# Patient Record
Sex: Male | Born: 1945 | Race: White | Hispanic: No | Marital: Married
Health system: Midwestern US, Community
[De-identification: ages and names within clinical notes are randomized; demographics above are authoritative.]

## PROBLEM LIST (undated history)

## (undated) DIAGNOSIS — N401 Enlarged prostate with lower urinary tract symptoms: Secondary | ICD-10-CM

## (undated) DIAGNOSIS — R351 Nocturia: Secondary | ICD-10-CM

## (undated) DIAGNOSIS — N410 Acute prostatitis: Secondary | ICD-10-CM

## (undated) DIAGNOSIS — R300 Dysuria: Secondary | ICD-10-CM

## (undated) DIAGNOSIS — N138 Other obstructive and reflux uropathy: Secondary | ICD-10-CM

## (undated) DIAGNOSIS — I10 Essential (primary) hypertension: Secondary | ICD-10-CM

## (undated) DIAGNOSIS — F0390 Unspecified dementia without behavioral disturbance: Secondary | ICD-10-CM

## (undated) DIAGNOSIS — N390 Urinary tract infection, site not specified: Secondary | ICD-10-CM

## (undated) DIAGNOSIS — M1990 Unspecified osteoarthritis, unspecified site: Secondary | ICD-10-CM

## (undated) DIAGNOSIS — F4310 Post-traumatic stress disorder, unspecified: Secondary | ICD-10-CM

## (undated) HISTORY — PX: TRANSURETHRAL RESECTION OF PROSTATE: SHX73

---

## 1993-08-01 DIAGNOSIS — E039 Hypothyroidism, unspecified: Secondary | ICD-10-CM | POA: Insufficient documentation

## 2012-12-11 DIAGNOSIS — M4310 Spondylolisthesis, site unspecified: Secondary | ICD-10-CM | POA: Insufficient documentation

## 2012-12-11 DIAGNOSIS — M48062 Spinal stenosis, lumbar region with neurogenic claudication: Secondary | ICD-10-CM | POA: Insufficient documentation

## 2012-12-27 DIAGNOSIS — N138 Other obstructive and reflux uropathy: Secondary | ICD-10-CM | POA: Diagnosis present

## 2012-12-27 DIAGNOSIS — F419 Anxiety disorder, unspecified: Secondary | ICD-10-CM | POA: Insufficient documentation

## 2012-12-27 DIAGNOSIS — N401 Enlarged prostate with lower urinary tract symptoms: Secondary | ICD-10-CM | POA: Diagnosis present

## 2012-12-27 DIAGNOSIS — I10 Essential (primary) hypertension: Secondary | ICD-10-CM | POA: Insufficient documentation

## 2012-12-27 DIAGNOSIS — F17200 Nicotine dependence, unspecified, uncomplicated: Secondary | ICD-10-CM | POA: Insufficient documentation

## 2012-12-27 DIAGNOSIS — F32A Depression, unspecified: Secondary | ICD-10-CM | POA: Insufficient documentation

## 2012-12-27 DIAGNOSIS — I4510 Unspecified right bundle-branch block: Secondary | ICD-10-CM | POA: Insufficient documentation

## 2012-12-27 DIAGNOSIS — R000 Tachycardia, unspecified: Secondary | ICD-10-CM | POA: Insufficient documentation

## 2013-01-24 DIAGNOSIS — M6281 Muscle weakness (generalized): Secondary | ICD-10-CM

## 2013-01-31 DIAGNOSIS — F4310 Post-traumatic stress disorder, unspecified: Secondary | ICD-10-CM | POA: Insufficient documentation

## 2014-04-29 DIAGNOSIS — M4802 Spinal stenosis, cervical region: Secondary | ICD-10-CM | POA: Insufficient documentation

## 2014-04-29 DIAGNOSIS — M5124 Other intervertebral disc displacement, thoracic region: Secondary | ICD-10-CM | POA: Insufficient documentation

## 2014-05-06 DIAGNOSIS — M5416 Radiculopathy, lumbar region: Secondary | ICD-10-CM | POA: Insufficient documentation

## 2014-05-27 DIAGNOSIS — N411 Chronic prostatitis: Secondary | ICD-10-CM | POA: Insufficient documentation

## 2014-06-20 DIAGNOSIS — G8918 Other acute postprocedural pain: Secondary | ICD-10-CM | POA: Insufficient documentation

## 2014-06-20 DIAGNOSIS — I959 Hypotension, unspecified: Secondary | ICD-10-CM | POA: Insufficient documentation

## 2014-06-20 DIAGNOSIS — D689 Coagulation defect, unspecified: Secondary | ICD-10-CM | POA: Insufficient documentation

## 2014-06-20 DIAGNOSIS — D62 Acute posthemorrhagic anemia: Secondary | ICD-10-CM | POA: Insufficient documentation

## 2016-02-15 ENCOUNTER — Ambulatory Visit (HOSPITAL_COMMUNITY): Payer: Non-veteran care | Attending: Nurse Practitioner | Admitting: Physical Therapy

## 2016-02-15 DIAGNOSIS — M5442 Lumbago with sciatica, left side: Secondary | ICD-10-CM | POA: Diagnosis present

## 2016-02-15 DIAGNOSIS — M533 Sacrococcygeal disorders, not elsewhere classified: Secondary | ICD-10-CM | POA: Insufficient documentation

## 2016-02-15 DIAGNOSIS — M5441 Lumbago with sciatica, right side: Secondary | ICD-10-CM | POA: Diagnosis present

## 2016-02-15 DIAGNOSIS — R252 Cramp and spasm: Secondary | ICD-10-CM | POA: Insufficient documentation

## 2016-02-15 DIAGNOSIS — R262 Difficulty in walking, not elsewhere classified: Secondary | ICD-10-CM | POA: Diagnosis present

## 2016-02-15 NOTE — Patient Instructions (Signed)
Isometric Abdominal    Lying on back with knees bent, tighten stomach by pressing elbows down. Hold __5__ seconds. Repeat __10__ times per set. Do _1___ sets per session. Do _5___ sessions per day.  http://orth.exer.us/1086   Copyright  VHI. All rights reserved.  Isometric Gluteals    Tighten buttock muscles. Repeat _10___ times per set. Do _1___ sets per session. Do _5___ sessions per day.  http://orth.exer.us/1126   Copyright  VHI. All rights reserved.  Bridging    Slowly raise buttocks from floor, keeping stomach tight. Repeat ___10_ times per set. Do __2 times a day  http://orth.exer.us/1096   Copyright  VHI. All rights reserved.  Scapular Retraction (Standing)   May do sitting  With arms at sides, pinch shoulder blades together. Repeat _10___ times per set. Do _1___ sets per session. Do 3____ sessions per day.  http://orth.exer.us/944   Copyright  VHI. All rights reserved.  Try and pull your testicles up towards your stomach.  You should feel your penis retract like a  Turtle.  Hold 5 seconds relax for 10 seconds and try again .. Do 10 times twice a day

## 2016-02-16 NOTE — Therapy (Signed)
[facility] [address] [place], [place], [address] Phone: [phone]   Fax:  [phone]  Physical Therapy Evaluation  Patient Details  Name: Patrick Kerr MRN: [id number] Date of Birth: 1946/07/19 Referring Provider: Ples SpecterPeggy Cassanda  Encounter Date: 02/15/2016      PT End of Session - 02/15/16 1517    Visit Number 1   Number of Visits 12   Date for PT Re-Evaluation 03/17/16   Authorization Type VA   Authorization - Visit Number 1   Authorization - Number of Visits 10   PT Start Time 1515   PT Stop Time 1600   PT Time Calculation (min) 45 min   Activity Tolerance Patient tolerated treatment well   Behavior [facility] Therapy [facility] for tasks assessed/performed      No past medical history on file.  No past surgical history on file.  There were no vitals filed for this visit.       Subjective Assessment - 02/15/16 1522    Subjective Patrick Kerr states that he has had two back surgeries and a prostate surgery.  He states that if he is riding on his stationary bike he begins to have pain going down both of his outer legs after about 10 minutes.  He also states that he has difficulty moving both his urine and his bowels.     Pertinent History Back surgeries x 2; (one lumbar and one cervical)  prostrate surgery x2   How long can you sit comfortably? no problem    How long can you stand comfortably? able to stand for five minutes    How long can you walk comfortably? able to walk for about five minutes    Patient Stated Goals less pain, easier to have bowel movements and uninate.    Currently in Pain? Yes   Pain Score 4    Pain Location Penis   Pain Descriptors / Indicators Burning   Pain Type Chronic pain   Pain Onset More than a month ago   Pain Frequency Constant   Aggravating Factors  acidic food    Pain Relieving Factors unsure    Effect of Pain on Daily Activities not sure             [facility] -  02/16/16 0001    Assessment   Medical Diagnosis pelvic floor pain    Onset Date/Surgical Date 08/15/14  increasing in sx since 01/14/2016   Next MD Visit unscheduled   Prior Therapy none   Precautions   Precautions None   Restrictions   Weight Bearing Restrictions No   Home Environment   Living Environment Private residence   Prior Function   Level of Independence Independent   Cognition   Overall Cognitive Status Within Functional Limits for tasks assessed   Posture/Postural Control   Posture/Postural Control Postural limitations   Postural Limitations Rounded Shoulders;Forward head;Decreased lumbar lordosis;Flexed trunk   AROM   Lumbar Flexion --  all motiion with hip and knee    Lumbar Extension 0   Lumbar - Right Side Bend 10   Lumbar - Left Side Bend 10   Strength   Overall Strength Within functional limits for tasks performed   Overall Strength Comments pelvic floor unable to complete a kegal    Right Hip Extension 2+/5   Left Hip Extension 2+/5    lower abdominal 2+/5        02/15/16 0001  Posture/Postural Control  Posture/Postural Control Postural  limitations  Postural Limitations Rounded Shoulders;Forward head;Decreased lumbar lordosis;Flexed trunk  Exercises  Exercises Lumbar  Lumbar Exercises: Standing  Other Standing Lumbar Exercises 3-D hip excursion   Lumbar Exercises: Supine  Ab Set 10 reps  Bridge 10 reps  Other Supine Lumbar Exercises kegal x 10                       PT Education - 02/15/16 1515    Education provided Yes   Education Details HEP   Person(s) Educated Patient   Methods Explanation;Demonstration;Handout   Comprehension Verbalized understanding;Returned demonstration          PT Short Term Goals - 02/15/16 1519    PT SHORT TERM GOAL #1   Title Pt to verbalize the importance of posture in back care as well as the benefit of easing voiding    Time 2   Period Weeks   Status New   PT SHORT TERM GOAL #2    Title Pt to have no more radiating pain into either of his legs to allow pt to be able to walk for 30 mintues in comfort.    Time 3   Period Weeks   Status New   PT SHORT TERM GOAL #3   Title Pt to state that voiding is 30% easier    Time 3   Period Weeks   Status New   PT SHORT TERM GOAL #4   Title Pt to state that defecating is 30% easier    Time 3   Period Weeks           PT Long Term Goals - 02/16/16 1610    PT LONG TERM GOAL #1   Title Pt to be independent in an advance home exercise program to allow pt to decrease low back pain to no greater than a 2/10 to allow pt to walk for an hour and a half to allow pt to walk in comfort   Time 6   Period Weeks   Status New   PT LONG TERM GOAL #2   Title Pt to state that he is able to void with 50% more ease   Time 6   Period Weeks   Status New   PT LONG TERM GOAL #3   Title Pt to state that he is able to defecate with 50% more ease    Time 6   Period Weeks   Status New               Plan - 02/15/16 1517    Clinical Impression Statement Patrick Kerr is a 70 yo male who has complaints of low back pain radiating bilaterally down lateral aspect of both thighs to the knee level as well as testicular and penile pain.  The patient also states that he is having difficulty urinating and defecating.  He has been referred to skilled physcial therapy to attempt to resolve these issues.  Examination demonstrates poor posture, weakened abdominal, gluteal and back musculature.  Patrick Kerr will benefit from skilled physical therapy for therapuetic exercises and manual techniques to decrease his symptoms.    Rehab Potential Good   PT Frequency 2x / week   PT Duration 6 weeks   PT Treatment/Interventions ADLs/Self Care Home Management;Therapeutic activities;Therapeutic exercise;Manual techniques;Neuromuscular re-education;Electrical Stimulation;Cryotherapy;Moist Heat   PT Next Visit Plan Pt to be seen for core strengthening.  Begin  prone exercises and postural t-band next treatment.    PT Home Exercise Plan given  Consulted and Agree with Plan of Care Patient      Patient will benefit from skilled therapeutic intervention in order to improve the following deficits and impairments:  Decreased range of motion, Decreased strength, Pain, Increased fascial restricitons, Postural dysfunction  Visit Diagnosis: Bilateral low back pain with sciatica, sciatica laterality unspecified  Difficulty in walking, not elsewhere classified  Cramp and spasm  Sacrococcygeal disorders, not elsewhere classified      G-Codes - 02/23/16 1515    Functional Assessment Tool Used clinical judgement; pt subjective complaints   Functional Limitation Other PT primary   Other PT Primary Current Status (J4782) At least 40 percent but less than 60 percent impaired, limited or restricted   Other PT Primary Goal Status (N5621) At least 20 percent but less than 40 percent impaired, limited or restricted       Problem List There are no active problems to display for this patient.   Virgina Organ, PT CLT [phone] 02/16/2016, 8:44 AM  [facility] [address] [place], [place], [address] Phone: [phone]   Fax:  [phone]  Name: Patrick Kerr MRN: [id number] Date of Birth: June 22, 1946

## 2016-02-19 ENCOUNTER — Ambulatory Visit (HOSPITAL_COMMUNITY): Payer: Non-veteran care | Admitting: Physical Therapy

## 2016-02-19 DIAGNOSIS — R252 Cramp and spasm: Secondary | ICD-10-CM

## 2016-02-19 DIAGNOSIS — M5441 Lumbago with sciatica, right side: Secondary | ICD-10-CM | POA: Diagnosis not present

## 2016-02-19 DIAGNOSIS — R262 Difficulty in walking, not elsewhere classified: Secondary | ICD-10-CM

## 2016-02-19 DIAGNOSIS — M533 Sacrococcygeal disorders, not elsewhere classified: Secondary | ICD-10-CM

## 2016-02-19 DIAGNOSIS — M5442 Lumbago with sciatica, left side: Principal | ICD-10-CM

## 2016-02-19 NOTE — Patient Instructions (Signed)
Bent Leg Lift (Hook-Lying)    Tighten stomach and slowly raise right leg _2___ inches from floor. Keep trunk rigid. Hold __3__ seconds. Repeat __10__ times per set. Do 1____ sets per session. Do ___2_ sessions per day.  http://orth.exer.us/1090   Copyright  VHI. All rights reserved.  Heel Walk (Hook-Lying)    Tighten stomach and slowly slide your foot forward  until leg are nearly straight, or until back begins to arch. Return and repeat with opposite leg Repeat _10___ times per set. Do _1___ sets per session. Do __2__ sessions per day.  http://orth.exer.us/1094   Copyright  VHI. All rights reserved.

## 2016-02-19 NOTE — Therapy (Signed)
[facility] [address] [place], [place], [address] Phone: [phone]   Fax:  [phone]  Physical Therapy Treatment  Patient Details  Name: Patrick CritchleyGary D Manolis MRN: [id number] Date of Birth: Jul 13, 1946 Referring Provider: Ples SpecterPeggy Cassanda  Encounter Date: 02/19/2016      PT End of Session - 02/19/16 0925    Visit Number 2   Number of Visits 12   Date for PT Re-Evaluation 03/17/16   Authorization Type VA   Authorization - Visit Number 2   Authorization - Number of Visits 10   PT Start Time 0905   PT Stop Time 0945   PT Time Calculation (min) 40 min   Activity Tolerance Patient tolerated treatment well   Behavior During Therapy [facility] for tasks assessed/performed      No past medical history on file.  No past surgical history on file.  There were no vitals filed for this visit.      Subjective Assessment - 02/19/16 0909    Subjective Pt states that he has been doing his exercises pretty good    Pertinent History Back surgeries x 2; (one lumbar and one cervical)  prostrate surgery x2   How long can you sit comfortably? no problem    How long can you stand comfortably? able to stand for five minutes    How long can you walk comfortably? able to walk for about five minutes    Patient Stated Goals less pain, easier to have bowel movements and uninate.    Currently in Pain? No/denies   Pain Onset More than a month ago                         [facility] Treatment/Exercise - 02/19/16 0001    Exercises   Exercises Lumbar   Lumbar Exercises: Standing   Other Standing Lumbar Exercises 3-D hip excursion    Other Standing Lumbar Exercises standing at door with Bilateral UE flexion keeping abdominal tight    Lumbar Exercises: Supine   Heel Slides 10 reps   Bent Knee Raise 10 reps   Bridge 10 reps   Bridge Limitations with adduction squeeze    Other Supine Lumbar Exercises kegal x 10    Manual Therapy   Manual  Therapy Soft tissue mobilization   Manual therapy comments done seperate from all other aspects of treatment   Soft tissue mobilization colonic massage                   PT Short Term Goals - 02/19/16 0948    PT SHORT TERM GOAL #1   Title Pt to verbalize the importance of posture in back care as well as the benefit of easing voiding    Time 2   Period Weeks   Status On-going   PT SHORT TERM GOAL #2   Title Pt to have no more radiating pain into either of his legs to allow pt to be able to walk for 30 mintues in comfort.    Time 3   Period Weeks   Status On-going   PT SHORT TERM GOAL #3   Title Pt to state that voiding is 30% easier    Time 3   Period Weeks   Status On-going   PT SHORT TERM GOAL #4   Title Pt to state that defecating is 30% easier    Time 3   Period Weeks   Status On-going  PT Long Term Goals - 02/19/16 0948    PT LONG TERM GOAL #1   Title Pt to be independent in an advance home exercise program to allow pt to decrease low back pain to no greater than a 2/10 to allow pt to walk for an hour and a half to allow pt to walk in comfort   Time 6   Period Weeks   Status On-going   PT LONG TERM GOAL #2   Title Pt to state that he is able to void with 50% more ease   Time 6   Period Weeks   Status On-going   PT LONG TERM GOAL #3   Title Pt to state that he is able to defecate with 50% more ease    Time 6   Period Weeks   Status On-going               Plan - 02/19/16 4098    Clinical Impression Statement Therapist added new exercises with  verbal cuing for proper technique.  Evaluation and goals reviewed with pt.    Rehab Potential Good   PT Frequency 2x / week   PT Duration 6 weeks   PT Treatment/Interventions ADLs/Self Care Home Management;Therapeutic activities;Therapeutic exercise;Manual techniques;Neuromuscular re-education;Electrical Stimulation;Cryotherapy;Moist Heat   PT Next Visit Plan Pt to be seen for core  strengthening.  Begin prone exercises and postural t-band next treatment.    PT Home Exercise Plan given   Consulted and Agree with Plan of Care Patient      Patient will benefit from skilled therapeutic intervention in order to improve the following deficits and impairments:  Decreased range of motion, Decreased strength, Pain, Increased fascial restricitons, Postural dysfunction  Visit Diagnosis: Bilateral low back pain with sciatica, sciatica laterality unspecified  Difficulty in walking, not elsewhere classified  Cramp and spasm  Sacrococcygeal disorders, not elsewhere classified     Problem List There are no active problems to display for this patient.   Virgina Organ, PT CLT [phone] 02/19/2016, 9:49 AM  [facility] [address] [place], [place], [address] Phone: [phone]   Fax:  [phone]  Name: BRYDAN DOWNARD MRN: [id number] Date of Birth: 1946/04/08

## 2016-02-26 ENCOUNTER — Ambulatory Visit (HOSPITAL_COMMUNITY): Payer: Non-veteran care | Admitting: Physical Therapy

## 2016-02-26 DIAGNOSIS — M5441 Lumbago with sciatica, right side: Secondary | ICD-10-CM

## 2016-02-26 DIAGNOSIS — M533 Sacrococcygeal disorders, not elsewhere classified: Secondary | ICD-10-CM

## 2016-02-26 DIAGNOSIS — M5442 Lumbago with sciatica, left side: Principal | ICD-10-CM

## 2016-02-26 DIAGNOSIS — R252 Cramp and spasm: Secondary | ICD-10-CM

## 2016-02-26 DIAGNOSIS — R262 Difficulty in walking, not elsewhere classified: Secondary | ICD-10-CM

## 2016-02-26 NOTE — Patient Instructions (Addendum)
Scapular Retraction (Prone)    Lie with arms at sides. Pinch shoulder blades together and raise arms a few inches from floor. Repeat __10__ times per set. Do1 ____ sets per session. Do __2__ sessions per day.  http://orth.exer.us/954   Copyright  VHI. All rights reserved.  Scapular: Retraction (Prone)    Holding __0__ pound weights, keep arms out from sides and elbows bent. Pull elbows back, pinching shoulder blades together. Repeat _10___ times per set. Do __1__ sets per session. Do ___2_ sessions per day. 10 http://orth.exer.us/862   Copyright  VHI. All rights reserved.  Strengthening: Hip Extension (Prone)    Tighten muscles on front of left thigh, then lift leg __2__ inches from surface, keeping knee locked. Repeat _10___ times per set. Do __1__ sets per session. Do ____ sessions per day. 2 http://orth.exer.us/620   Copyright  VHI. All rights reserved.

## 2016-02-26 NOTE — Therapy (Signed)
[facility] [address] [place], [place], [address] Phone: [phone]   Fax:  [phone]  Physical Therapy Treatment  Patient Details  Name: DEVEN FURIA MRN: [id number] Date of Birth: 05-Dec-1945 Referring Provider: Ples Specter  Encounter Date: 02/26/2016      PT End of Session - 02/26/16 1105    Visit Number 3   Number of Visits 12   Date for PT Re-Evaluation 03/17/16   Authorization Type VA   Authorization - Visit Number 3   Authorization - Number of Visits 10   PT Start Time 1035   PT Stop Time 1118   PT Time Calculation (min) 43 min   Activity Tolerance Patient tolerated treatment well   Behavior During Therapy [facility] for tasks assessed/performed      No past medical history on file.  No past surgical history on file.  There were no vitals filed for this visit.      Subjective Assessment - 02/26/16 1051    Subjective Mr. McCowell states that he has been doing his exercises.  He states that it is not as difficult to have a BM now.    Pain Score 4    Pain Location Penis   Pain Type Chronic pain   Pain Onset More than a month ago   Pain Frequency Constant   Aggravating Factors  activity    Pain Relieving Factors lying down    Effect of Pain on Daily Activities increases pain    Multiple Pain Sites No                         [facility] Adult PT Treatment/Exercise - 02/26/16 0001      Exercises   Exercises Lumbar     Lumbar Exercises: Standing   Scapular Retraction 10 reps   Theraband Level (Scapular Retraction) Level 3 (Green)   Row 10 reps   Theraband Level (Row) Level 3 (Green)   Shoulder Extension Strengthening;10 reps   Other Standing Lumbar Exercises 3-D hip excursion    Other Standing Lumbar Exercises standing at door with Bilateral UE flexion keeping abdominal tight      Lumbar Exercises: Supine   Heel Slides 10 reps   Bent Knee Raise 10 reps   Bridge 10 reps   Bridge Limitations  with adduction squeeze    Other Supine Lumbar Exercises kegal x 10      Lumbar Exercises: Prone   Straight Leg Raise 10 reps   Other Prone Lumbar Exercises Rows; shoulder extension x 10      Manual Therapy   Manual Therapy Soft tissue mobilization   Manual therapy comments done seperate from all other aspects of treatment   Soft tissue mobilization colonic massage                 PT Education - 02/26/16 1104    Education provided Yes   Education Details new HEP   Person(s) Educated Patient   Methods Explanation;Handout;Tactile cues   Comprehension Verbalized understanding;Returned demonstration          PT Short Term Goals - 02/26/16 1118      PT SHORT TERM GOAL #1   Title Pt to verbalize the importance of posture in back care as well as the benefit of easing voiding    Time 2   Period Weeks   Status Achieved     PT SHORT TERM GOAL #2   Title Pt to have  no more radiating pain into either of his legs to allow pt to be able to walk for 30 mintues in comfort.    Time 3   Period Weeks   Status On-going     PT SHORT TERM GOAL #3   Title Pt to state that voiding is 30% easier    Time 3   Period Weeks   Status On-going     PT SHORT TERM GOAL #4   Title Pt to state that defecating is 30% easier    Time 3   Period Weeks   Status On-going           PT Long Term Goals - 02/26/16 1119      PT LONG TERM GOAL #1   Title Pt to be independent in an advance home exercise program to allow pt to decrease low back pain to no greater than a 2/10 to allow pt to walk for an hour and a half to allow pt to walk in comfort   Time 6   Period Weeks   Status On-going     PT LONG TERM GOAL #2   Title Pt to state that he is able to void with 50% more ease   Time 6   Period Weeks   Status On-going     PT LONG TERM GOAL #3   Title Pt to state that he is able to defecate with 50% more ease    Time 6   Period Weeks   Status On-going               Plan -  02/26/16 1105    Clinical Impression Statement Mr. Mizera has noticed slight improvement in his symptoms.  Therapist began new postural and core strengthening exercise with manual and verbal cuing needed for proper technique.   Pt given handout of core strengthening; will give t-band and posture exercise next treatment if pt demonstrates good technique.    Rehab Potential Good   PT Frequency 2x / week   PT Duration 6 weeks   PT Treatment/Interventions ADLs/Self Care Home Management;Therapeutic activities;Therapeutic exercise;Manual techniques;Neuromuscular re-education;Electrical Stimulation;Cryotherapy;Moist Heat   PT Next Visit Plan Pt to be seen for core strengthening.  Begin t-band golf swing and reverse golf swing next treatment.    PT Home Exercise Plan given   Consulted and Agree with Plan of Care Patient      Patient will benefit from skilled therapeutic intervention in order to improve the following deficits and impairments:  Decreased range of motion, Decreased strength, Pain, Increased fascial restricitons, Postural dysfunction  Visit Diagnosis: Bilateral low back pain with sciatica, sciatica laterality unspecified  Difficulty in walking, not elsewhere classified  Cramp and spasm  Sacrococcygeal disorders, not elsewhere classified     Problem List There are no active problems to display for this patient. Virgina Organ, PT CLT [phone] 02/26/2016, 11:21 AM  [facility] [address] [place], [place], [address] Phone: [phone]   Fax:  [phone]  Name: KEYONNE FORSHA MRN: [id number] Date of Birth: 1945/12/10

## 2016-02-29 ENCOUNTER — Ambulatory Visit (HOSPITAL_COMMUNITY): Payer: Non-veteran care | Admitting: Physical Therapy

## 2016-02-29 DIAGNOSIS — M5441 Lumbago with sciatica, right side: Secondary | ICD-10-CM

## 2016-02-29 DIAGNOSIS — M533 Sacrococcygeal disorders, not elsewhere classified: Secondary | ICD-10-CM

## 2016-02-29 DIAGNOSIS — M5442 Lumbago with sciatica, left side: Principal | ICD-10-CM

## 2016-02-29 DIAGNOSIS — R252 Cramp and spasm: Secondary | ICD-10-CM

## 2016-02-29 DIAGNOSIS — R262 Difficulty in walking, not elsewhere classified: Secondary | ICD-10-CM

## 2016-02-29 NOTE — Therapy (Signed)
[facility] [address] [place], [place], [address] Phone: [phone]   Fax:  [phone]  Physical Therapy Treatment  Patient Details  Name: Patrick Kerr MRN: [id number] Date of Birth: 1946-03-23 Referring Provider: Ples Specter  Encounter Date: 02/29/2016      PT End of Session - 02/29/16 1039    Visit Number 4   Number of Visits 12   Date for PT Re-Evaluation 03/17/16   Authorization Type [facility]   Authorization - Visit Number 4   Authorization - Number of Visits 10   PT Start Time 1020   PT Stop Time 1106   PT Time Calculation (min) 46 min   Activity Tolerance Patient tolerated treatment well   Behavior During Therapy [facility] for tasks assessed/performed      No past medical history on file.  No past surgical history on file.  There were no vitals filed for this visit.      Subjective Assessment - 02/29/16 1107    Subjective Pt states that he has the same amount of pain in the shaft of his penis and goes into his testicles.  States that he has been to five urologist all who have cleared him.     Pertinent History Back surgeries x 2; (one lumbar and one cervical)  prostrate surgery x2   How long can you sit comfortably? no problem    How long can you stand comfortably? able to stand for five minutes    How long can you walk comfortably? able to walk for about five minutes    Patient Stated Goals less pain, easier to have bowel movements and uninate.    Pain Score 3    Pain Location Penis   Pain Descriptors / Indicators Aching   Pain Radiating Towards testicles   Pain Onset More than a month ago   Aggravating Factors  unknown   Pain Relieving Factors unknown   Effect of Pain on Daily Activities unknown                         [facility] Adult PT Treatment/Exercise - 02/29/16 0001      Exercises   Exercises Lumbar     Lumbar Exercises: Standing   Scapular Retraction 10 reps   Theraband Level (Scapular  Retraction) Level 3 (Green)   Row 10 reps   Theraband Level (Row) Level 3 (Green)   Shoulder Extension Strengthening;10 reps   Other Standing Lumbar Exercises golfing motion with green t-band    Other Standing Lumbar Exercises standing at door with Bilateral UE flexion keeping abdominal tight      Lumbar Exercises: Supine   Heel Slides 10 reps   Toe tap  10 reps   Bridge 15 reps   Bridge Limitations with adduction squeeze    Other Supine Lumbar Exercises kegal x 15     Lumbar Exercises: Prone   Straight Leg Raise --   Other Prone Lumbar Exercises --     Manual Therapy   Manual Therapy Soft tissue mobilization   Manual therapy comments done seperate from all other aspects of treatment   Soft tissue mobilization colonic massage                 PT Education - 02/29/16 1039    Education provided Yes   Education Details t-band exercises   Person(s) Educated Patient   Methods Explanation;Demonstration;Verbal cues;Handout   Comprehension Verbalized understanding;Returned demonstration  PT Short Term Goals - 02/29/16 1109      PT SHORT TERM GOAL #1   Title Pt to verbalize the importance of posture in back care as well as the benefit of easing voiding    Time 2   Period Weeks   Status Achieved     PT SHORT TERM GOAL #2   Title Pt to have no more radiating pain into either of his legs to allow pt to be able to walk for 30 mintues in comfort.    Time 3   Period Weeks   Status On-going     PT SHORT TERM GOAL #3   Title Pt to state that voiding is 30% easier    Time 3   Period Weeks   Status On-going     PT SHORT TERM GOAL #4   Title Pt to state that defecating is 30% easier    Time 3   Period Weeks   Status On-going           PT Long Term Goals - 02/29/16 1109      PT LONG TERM GOAL #1   Title Pt to be independent in an advance home exercise program to allow pt to decrease low back pain to no greater than a 2/10 to allow pt to walk for an hour  and a half to allow pt to walk in comfort   Time 6   Period Weeks   Status On-going     PT LONG TERM GOAL #2   Title Pt to state that he is able to void with 50% more ease   Time 6   Period Weeks   Status On-going     PT LONG TERM GOAL #3   Title Pt to state that he is able to defecate with 50% more ease    Time 6   Period Weeks   Status On-going               Plan - 02/29/16 1040    Clinical Impression Statement PT has decreased proprioception and is not able to tell when he is completing a [place], although therapist can feel when pt is contracting.     Rehab Potential Good   PT Frequency 2x / week   PT Duration 6 weeks   PT Treatment/Interventions ADLs/Self Care Home Management;Therapeutic activities;Therapeutic exercise;Manual techniques;Neuromuscular re-education;Electrical Stimulation;Cryotherapy;Moist Heat   PT Next Visit Plan Pt to be seen for core strengthening.  Begin t-band over the door next treatment down to opposite hip.    PT Home Exercise Plan given   Consulted and Agree with Plan of Care Patient      Patient will benefit from skilled therapeutic intervention in order to improve the following deficits and impairments:  Decreased range of motion, Decreased strength, Pain, Increased fascial restricitons, Postural dysfunction  Visit Diagnosis: Bilateral low back pain with sciatica, sciatica laterality unspecified  Difficulty in walking, not elsewhere classified  Cramp and spasm  Sacrococcygeal disorders, not elsewhere classified     Problem List There are no active problems to display for this patient.  Virgina Organ, PT CLT [phone] 02/29/2016, 11:10 AM  [facility] [address] [place], [place], [address] Phone: [phone]   Fax:  [phone]  Name: Patrick Kerr MRN: [id number] Date of Birth: 09-May-1946

## 2016-03-03 ENCOUNTER — Ambulatory Visit (HOSPITAL_COMMUNITY): Payer: Non-veteran care | Attending: Nurse Practitioner | Admitting: Physical Therapy

## 2016-03-03 DIAGNOSIS — R252 Cramp and spasm: Secondary | ICD-10-CM | POA: Diagnosis present

## 2016-03-03 DIAGNOSIS — M533 Sacrococcygeal disorders, not elsewhere classified: Secondary | ICD-10-CM | POA: Insufficient documentation

## 2016-03-03 DIAGNOSIS — R262 Difficulty in walking, not elsewhere classified: Secondary | ICD-10-CM | POA: Diagnosis present

## 2016-03-03 DIAGNOSIS — M5442 Lumbago with sciatica, left side: Secondary | ICD-10-CM | POA: Diagnosis present

## 2016-03-03 DIAGNOSIS — M5441 Lumbago with sciatica, right side: Secondary | ICD-10-CM

## 2016-03-03 NOTE — Therapy (Signed)
[facility] [address] [place], [place], [address] Phone: [phone]   Fax:  [phone]  Physical Therapy Treatment  Patient Details  Name: Patrick Kerr MRN: [id number] Date of Birth: [birth year] Referring Provider: Ples Specter  Encounter Date: 03/03/2016      PT End of Session - 03/03/16 1143    Visit Number 5   Number of Visits 12   Date for PT Re-Evaluation 03/17/16   Authorization Type VA   Authorization - Visit Number 5   Authorization - Number of Visits 10   PT Start Time 1116   PT Stop Time 1200   PT Time Calculation (min) 44 min   Activity Tolerance Patient tolerated treatment well   Behavior During Therapy [facility] for tasks assessed/performed      No past medical history on file.  No past surgical history on file.  There were no vitals filed for this visit.      Subjective Assessment - 03/03/16 1118    Subjective Pt states that he has been doing his exercises    Pertinent History Back surgeries x 2; (one lumbar and one cervical)  prostrate surgery x2   How long can you sit comfortably? no problem    How long can you stand comfortably? able to stand for five minutes    How long can you walk comfortably? able to walk for about five minutes    Patient Stated Goals less pain, easier to have bowel movements and uninate.    Pain Score 2    Pain Location Penis   Pain Descriptors / Indicators Stabbing   Pain Type Chronic pain   Pain Onset More than a month ago   Pain Frequency Intermittent   Aggravating Factors  unknown   Pain Relieving Factors unknown   Effect of Pain on Daily Activities unknown                          [facility] Adult PT Treatment/Exercise - 03/03/16 0001      Exercises   Exercises Lumbar     Lumbar Exercises: Stretches   Hip Flexor Stretch 1 rep;60 seconds     Lumbar Exercises: Standing   Scapular Retraction 10 reps   Theraband Level (Scapular Retraction) Level 3 (Green)    Row 10 reps   Theraband Level (Row) Level 3 (Green)   Shoulder Extension Strengthening;10 reps   Shoulder Extension Limitations T-band over doorway Rt arm to Lt hip; Lt arm to rt hip, then B UE to B hip x 10 each   Other Standing Lumbar Exercises golfing motion with green t-band    Other Standing Lumbar Exercises standing at door with Bilateral UE flexion keeping abdominal tight      Lumbar Exercises: Supine   Heel Slides 15 reps   Bent Knee Raise --   Bent Knee Raise Limitations RT leg at 90 degrees lower 30 degrees x 10 repeat with left    Bridge 15 reps   Bridge Limitations with adduction squeeze    Other Supine Lumbar Exercises kegal x 15   Other Supine Lumbar Exercises toe tap x 10; ab crunch x 1o      Lumbar Exercises: Prone   Straight Leg Raise 10 reps   Other Prone Lumbar Exercises Rows; shoulder extension x 10      Manual Therapy   Manual Therapy --   Manual therapy comments --   Soft tissue mobilization --  PT Short Term Goals - 03/03/16 1146      PT SHORT TERM GOAL #1   Title Pt to verbalize the importance of posture in back care as well as the benefit of easing voiding    Time 2   Period Weeks   Status Achieved     PT SHORT TERM GOAL #2   Title Pt to have no more radiating pain into either of his legs to allow pt to be able to walk for 30 mintues in comfort.    Time 3   Period Weeks   Status On-going     PT SHORT TERM GOAL #3   Title Pt to state that voiding is 30% easier    Time 3   Period Weeks   Status On-going     PT SHORT TERM GOAL #4   Title Pt to state that defecating is 30% easier    Time 3   Period Weeks   Status On-going           PT Long Term Goals - 03/03/16 1146      PT LONG TERM GOAL #1   Title Pt to be independent in an advance home exercise program to allow pt to decrease low back pain to no greater than a 2/10 to allow pt to walk for an hour and a half to allow pt to walk in comfort   Time 6    Period Weeks   Status On-going     PT LONG TERM GOAL #2   Title Pt to state that he is able to void with 50% more ease   Time 6   Period Weeks   Status On-going     PT LONG TERM GOAL #3   Title Pt to state that he is able to defecate with 50% more ease    Time 6   Period Weeks   Status On-going               Plan - 03/03/16 1144    Clinical Impression Statement Pt pain level has decreased; bowel movements are easier but he continues to have difficulty getting his urine to flow.    Rehab Potential Good   PT Frequency 2x / week   PT Duration 6 weeks   PT Treatment/Interventions ADLs/Self Care Home Management;Therapeutic activities;Therapeutic exercise;Manual techniques;Neuromuscular re-education;Electrical Stimulation;Cryotherapy;Moist Heat   PT Next Visit Plan Pt to be seen for core strengthening; stop manual as pt is doing this independently at home now.     PT Home Exercise Plan given   Consulted and Agree with Plan of Care Patient      Patient will benefit from skilled therapeutic intervention in order to improve the following deficits and impairments:  Decreased range of motion, Decreased strength, Pain, Increased fascial restricitons, Postural dysfunction  Visit Diagnosis: Bilateral low back pain with sciatica, sciatica laterality unspecified  Difficulty in walking, not elsewhere classified  Cramp and spasm  Sacrococcygeal disorders, not elsewhere classified     Problem List There are no active problems to display for this patient.   Virgina Organ, PT CLT [phone] 03/03/2016, 12:00 PM  [facility] [address] [place], [place], [address] Phone: [phone]   Fax:  [phone]  Name: Patrick Kerr MRN: [id number] Date of Birth: 1946/01/27

## 2016-03-07 ENCOUNTER — Ambulatory Visit (HOSPITAL_COMMUNITY): Payer: Non-veteran care

## 2016-03-07 DIAGNOSIS — M533 Sacrococcygeal disorders, not elsewhere classified: Secondary | ICD-10-CM

## 2016-03-07 DIAGNOSIS — R262 Difficulty in walking, not elsewhere classified: Secondary | ICD-10-CM

## 2016-03-07 DIAGNOSIS — M5442 Lumbago with sciatica, left side: Principal | ICD-10-CM

## 2016-03-07 DIAGNOSIS — M5441 Lumbago with sciatica, right side: Secondary | ICD-10-CM | POA: Diagnosis not present

## 2016-03-07 DIAGNOSIS — R252 Cramp and spasm: Secondary | ICD-10-CM

## 2016-03-07 NOTE — Therapy (Signed)
[facility] [address] [place], [place], [address] Phone: [phone]   Fax:  [phone]  Physical Therapy Treatment  Patient Details  Name: Patrick CritchleyGary D Kerr MRN: [id number] Date of Birth: [birth year] Referring Provider: Ples SpecterPeggy Cassanda  Encounter Date: 03/07/2016      PT End of Session - 03/07/16 1046    Visit Number 6   Number of Visits 12   Date for PT Re-Evaluation 03/17/16   Authorization Type VA   Authorization - Visit Number 6   Authorization - Number of Visits 10   PT Start Time 1035   PT Stop Time 1113   PT Time Calculation (min) 38 min   Activity Tolerance Patient tolerated treatment well;No increased pain   Behavior [facility] Therapy [facility] for tasks assessed/performed      No past medical history on file.  No past surgical history on file.  There were no vitals filed for this visit.      Subjective Assessment - 03/07/16 1039    Subjective Pt doing fine today. Unremarkable weekend. Pt reports HEP is going well. His pain and sx remain unchanged.    Pertinent History Back surgeries x 2; (one lumbar and one cervical)  prostrate surgery x2                         [facility] Adult PT Treatment/Exercise - 03/07/16 0001      Lumbar Exercises: Stretches   Hip Flexor Stretch 60 seconds;2 reps  bilat; contrlateral hip flexed.      Lumbar Exercises: Standing   Scapular Retraction 10 reps   Theraband Level (Scapular Retraction) Level 3 (Green)   Row 10 reps   Theraband Level (Row) Level 3 (Green)   Shoulder Extension Strengthening;10 reps   Shoulder Extension Limitations T-band over doorway Rt arm to Lt hip; Lt arm to rt hip, then B UE to B hip x 10 each   Other Standing Lumbar Exercises golfing motion with green t-band    Other Standing Lumbar Exercises T-band over doorway Rt arm to Lt hip; Lt arm to rt hip, then B UE to B hip x 10 each     Lumbar Exercises: Supine   Heel Slides 15 reps   Bent Knee Raise 10  reps  90/90 double knee raise c ball squeeze    Bent Knee Raise Limitations RT leg at 90 degrees lower 30 degrees x 10 repeat with left    Bridge 15 reps  volleyball   Bridge Limitations with adduction squeeze    Straight Leg Raise 15 reps   Other Supine Lumbar Exercises --  *defered this session.      Lumbar Exercises: Prone   Straight Leg Raise 10 reps  very limited range, may work better in quadruped next visit   Other Prone Lumbar Exercises Rows; shoulder extension x 15   supine on t-spien towel roll                  PT Short Term Goals - 03/03/16 1146      PT SHORT TERM GOAL #1   Title Pt to verbalize the importance of posture in back care as well as the benefit of easing voiding    Time 2   Period Weeks   Status Achieved     PT SHORT TERM GOAL #2   Title Pt to have no more radiating pain into either of his legs to allow pt to be able  to walk for 30 mintues in comfort.    Time 3   Period Weeks   Status On-going     PT SHORT TERM GOAL #3   Title Pt to state that voiding is 30% easier    Time 3   Period Weeks   Status On-going     PT SHORT TERM GOAL #4   Title Pt to state that defecating is 30% easier    Time 3   Period Weeks   Status On-going           PT Long Term Goals - 03/03/16 1146      PT LONG TERM GOAL #1   Title Pt to be independent in an advance home exercise program to allow pt to decrease low back pain to no greater than a 2/10 to allow pt to walk for an hour and a half to allow pt to walk in comfort   Time 6   Period Weeks   Status On-going     PT LONG TERM GOAL #2   Title Pt to state that he is able to void with 50% more ease   Time 6   Period Weeks   Status On-going     PT LONG TERM GOAL #3   Title Pt to state that he is able to defecate with 50% more ease    Time 6   Period Weeks   Status On-going               Plan - 03/07/16 1050    Clinical Impression Statement Session focus largely on posture today,  continuing with activities from previous session. Some activites are modified to a supine on thoracici towel roll posture to engage greater mobilization of T spine. Pt able to show some progress toward goals as evident in ability to advance exercises today in either reps or resistance.    Rehab Potential Good   PT Frequency 2x / week   PT Duration 6 weeks   PT Treatment/Interventions ADLs/Self Care Home Management;Therapeutic activities;Therapeutic exercise;Manual techniques;Neuromuscular re-education;Electrical Stimulation;Cryotherapy;Moist Heat   PT Next Visit Plan Pt to be seen for core strengthening.    PT Home Exercise Plan given   Consulted and Agree with Plan of Care Patient      Patient will benefit from skilled therapeutic intervention in order to improve the following deficits and impairments:  Decreased range of motion, Decreased strength, Pain, Increased fascial restricitons, Postural dysfunction  Visit Diagnosis: Bilateral low back pain with sciatica, sciatica laterality unspecified  Difficulty in walking, not elsewhere classified  Cramp and spasm  Sacrococcygeal disorders, not elsewhere classified     Problem List There are no active problems to display for this patient.  11:13 AM, 03/07/16 Rosamaria Lints, PT, DPT Physical Therapist at [facility] [phone] (office)     [facility] [address] [place], [place], [address] Phone: [phone]   Fax:  [phone]  Name: Patrick Kerr MRN: [id number] Date of Birth: [birth year]

## 2016-03-10 ENCOUNTER — Ambulatory Visit (HOSPITAL_COMMUNITY): Payer: Non-veteran care | Admitting: Physical Therapy

## 2016-03-10 DIAGNOSIS — R252 Cramp and spasm: Secondary | ICD-10-CM

## 2016-03-10 DIAGNOSIS — M533 Sacrococcygeal disorders, not elsewhere classified: Secondary | ICD-10-CM

## 2016-03-10 DIAGNOSIS — M5441 Lumbago with sciatica, right side: Secondary | ICD-10-CM

## 2016-03-10 DIAGNOSIS — M5442 Lumbago with sciatica, left side: Principal | ICD-10-CM

## 2016-03-10 DIAGNOSIS — R262 Difficulty in walking, not elsewhere classified: Secondary | ICD-10-CM

## 2016-03-10 NOTE — Therapy (Signed)
Patrick Kerr, [place], [address] Phone: [phone]   Fax:  [phone]  Physical Therapy Treatment  Patient Details  Name: Patrick Kerr MRN: [id number] Date of Birth: 12-27-1945 Referring Provider: Brantley Fling  Encounter Date: 03/10/2016      PT End of Session - 03/10/16 1108    Visit Number 7   Number of Visits 12   Date for PT Re-Evaluation 03/17/16   Authorization Type VA   Authorization - Visit Number 7   Authorization - Number of Visits 10   PT Start Time 0174   PT Stop Time 1115   PT Time Calculation (min) 40 min   Activity Tolerance Patient tolerated treatment well;No increased pain   Behavior During Therapy [facility] for tasks assessed/performed      No past medical history on file.  No past surgical history on file.  There were no vitals filed for this visit.      Subjective Assessment - 03/10/16 1057    Subjective Pt  states that he can see a slight improvement .    Pertinent History Back surgeries x 2; (one lumbar and one cervical)  prostrate surgery x2   How long can you sit comfortably? no problem    How long can you stand comfortably? able to stand for five minutes    How long can you walk comfortably? able to walk for about five minutes    Patient Stated Goals less pain, easier to have bowel movements and uninate.    Pain Score 2    Pain Location Penis   Pain Descriptors / Indicators Burning   Pain Type Chronic pain   Pain Onset More than a month ago   Pain Frequency Intermittent   Aggravating Factors  nothing noted   Pain Relieving Factors nothing noted   Effect of Pain on Daily Activities nothing noted                         [facility] Adult PT Treatment/Exercise - 03/10/16 0001      Exercises   Exercises Lumbar     Lumbar Exercises: Stretches   Active Hamstring Stretch 3 reps;30 seconds   Hip Flexor Stretch 60 seconds;2 reps  bilat; contrlateral hip flexed.       Lumbar Exercises: Standing   Scapular Retraction --   Theraband Level (Scapular Retraction) --   Row --   Theraband Level (Row) --   Shoulder Extension --   Other Standing Lumbar Exercises B UE flexion keeping back to the door x 10; golfing motion with green t-band    Other Standing Lumbar Exercises T-band over doorway Rt arm to Lt hip; Lt arm to rt hip, then B UE to B hip x 10 each     Lumbar Exercises: Supine   Heel Slides --   Bent Knee Raise 10 reps  90/90 double knee raise c ball squeeze    Bent Knee Raise Limitations RT leg at 90 degrees lower 30 degrees x 10 repeat with left    Bridge    Bridge Limitations with adduction squeeze    Straight Leg Raise 15 reps   Other Supine Lumbar Exercises kegal x 15   Other Supine Lumbar Exercises toe tap x 10; ab crunch x 1o      Lumbar Exercises: Prone   Straight Leg Raise --  very limited range, may work better in quadruped next visit   Other Prone  Lumbar Exercises --  supine on t-spien towel roll     Lumbar Exercises: Quadruped   Straight Leg Raise 10 reps   Opposite Arm/Leg Raise Right arm/Left leg;Left arm/Right leg;10 reps                  PT Short Term Goals - 03/10/16 1110      PT SHORT TERM GOAL #1   Title Pt to verbalize the importance of posture in back care as well as the benefit of easing voiding    Time 2   Period Weeks   Status Achieved     PT SHORT TERM GOAL #2   Title Pt to have no more radiating pain into either of his legs to allow pt to be able to walk for 30 mintues in comfort.    Time 3   Period Weeks   Status Partially Met     PT SHORT TERM GOAL #3   Title Pt to state that voiding is 30% easier    Time 3   Period Weeks   Status On-going     PT SHORT TERM GOAL #4   Title Pt to state that defecating is 30% easier    Time 3   Period Weeks   Status On-going           PT Long Term Goals - 03/10/16 1110      PT LONG TERM GOAL #1   Title Pt to be independent in an advance home  exercise program to allow pt to decrease low back pain to no greater than a 2/10 to allow pt to walk for an hour and a half to allow pt to walk in comfort   Time 6   Period Weeks   Status Partially Met     PT LONG TERM GOAL #2   Title Pt to state that he is able to void with 50% more ease   Time 6   Period Weeks   Status On-going     PT LONG TERM GOAL #3   Title Pt to state that he is able to defecate with 50% more ease    Time 6   Period Weeks   Status On-going               Plan - 03/10/16 1108    Clinical Impression Statement Added quadriped exercises for improved core strength as well as hamstring stretches for improved posture.  Pt continues to need verbal cuing for proper technique with all exercises.     Rehab Potential Good   PT Frequency 2x / week   PT Duration 6 weeks   PT Treatment/Interventions ADLs/Self Care Home Management;Therapeutic activities;Therapeutic exercise;Manual techniques;Neuromuscular re-education;Electrical Stimulation;Cryotherapy;Moist Heat   PT Next Visit Plan Pt to be seen for core strengthening and postural education.   PT Home Exercise Plan given   Consulted and Agree with Plan of Care Patient      Patient will benefit from skilled therapeutic intervention in order to improve the following deficits and impairments:  Decreased range of motion, Decreased strength, Pain, Increased fascial restricitons, Postural dysfunction  Visit Diagnosis: Bilateral low back pain with sciatica, sciatica laterality unspecified  Difficulty in walking, not elsewhere classified  Cramp and spasm  Sacrococcygeal disorders, not elsewhere classified     Problem List There are no active problems to display for this patient.   Patrick Kerr, PT CLT [phone] 03/10/2016, 11:24 AM  [facility] [address]  [place], [place], [address] Phone: [phone]   Fax:  [phone]  Name: Patrick Kerr MRN:  [id number] Date of Birth: Aug 20, 1945

## 2016-03-14 ENCOUNTER — Ambulatory Visit (HOSPITAL_COMMUNITY): Payer: Non-veteran care | Admitting: Physical Therapy

## 2016-03-14 DIAGNOSIS — M533 Sacrococcygeal disorders, not elsewhere classified: Secondary | ICD-10-CM

## 2016-03-14 DIAGNOSIS — R262 Difficulty in walking, not elsewhere classified: Secondary | ICD-10-CM

## 2016-03-14 DIAGNOSIS — M5441 Lumbago with sciatica, right side: Secondary | ICD-10-CM | POA: Diagnosis not present

## 2016-03-14 DIAGNOSIS — M5442 Lumbago with sciatica, left side: Principal | ICD-10-CM

## 2016-03-14 DIAGNOSIS — R252 Cramp and spasm: Secondary | ICD-10-CM

## 2016-03-14 NOTE — Therapy (Addendum)
[place], [place], [address] Phone: [phone]   Fax:  [phone]  Physical Therapy Treatment  Patient Details  Name: Patrick Kerr MRN: [id number] Date of Birth: 1945/09/03 Referring Provider: Brantley Fling  Encounter Date: 03/14/2016      PT End of Session - 03/14/16 1108    Visit Number 8   Number of Visits 12   Date for PT Re-Evaluation 03/17/16   Authorization Type [facility]   Authorization - Visit Number 8   Authorization - Number of Visits 10   PT Start Time 1655   PT Stop Time 1115   PT Time Calculation (min) 41 min   Activity Tolerance Patient tolerated treatment well;No increased pain   Behavior During Therapy [facility] for tasks assessed/performed      No past medical history on file.  No past surgical history on file.  There were no vitals filed for this visit.      Subjective Assessment - 03/14/16 1033    Subjective Pt is doing his exercises at home.   Pertinent History Back surgeries x 2; (one lumbar and one cervical)  prostrate surgery x2   How long can you sit comfortably? no problem    How long can you stand comfortably? able to stand for five minutes    How long can you walk comfortably? able to walk for about five minutes    Patient Stated Goals less pain, easier to have bowel movements and uninate.    Currently in Pain? No/denies   Pain Onset More than a month ago                         [facility] Adult PT Treatment/Exercise - 03/14/16 0001      Exercises   Exercises Lumbar     Lumbar Exercises: Stretches   Active Hamstring Stretch 3 reps;30 seconds   Hip Flexor Stretch 60 seconds;2 reps  bilat; contrlateral hip flexed.      Lumbar Exercises: Standing   Other Standing Lumbar Exercises B UE flexion keeping back to the door x 10; golfing motion with green t-band    Other Standing Lumbar Exercises T-band below pull back B(elbows even with shoulders )T-band over  doorway Rt arm to Lt hip; Lt arm to rt hip, then B UE to B hip x 10 each     Lumbar Exercises: Supine   Bent Knee Raise 10 reps  90/90 double knee raise c ball squeeze    Bent Knee Raise Limitations RT leg at 90 degrees lower 30 degrees x 10 repeat with left    Bridge 15 reps  volleyball   Bridge Limitations with adduction squeeze    Straight Leg Raise 15 reps   Large Ball Abdominal Isometric 10 reps   Large Ball Oblique Isometric 10 reps   Other Supine Lumbar Exercises kegal x 15   Other Supine Lumbar Exercises toe tap x 10; ab crunch x 1o      Lumbar Exercises: Sidelying   Hip Abduction 15 reps     Lumbar Exercises: Prone   Straight Leg Raise --  very limited range, may work better in quadruped next visit   Other Prone Lumbar Exercises --  supine on t-spien towel roll     Lumbar Exercises: Quadruped   Straight Leg Raise 10 reps   Opposite Arm/Leg Raise Right arm/Left leg;Left arm/Right leg;5 reps  PT Short Term Goals - 03/14/16 1110      PT SHORT TERM GOAL #1   Title Pt to verbalize the importance of posture in back care as well as the benefit of easing voiding    Time 2   Period Weeks   Status Achieved     PT SHORT TERM GOAL #2   Title Pt to have no more radiating pain into either of his legs to allow pt to be able to walk for 30 mintues in comfort.    Time 3   Period Weeks   Status Partially Met     PT SHORT TERM GOAL #3   Title Pt to state that voiding is 30% easier    Time 3   Period Weeks   Status On-going     PT SHORT TERM GOAL #4   Title Pt to state that defecating is 30% easier    Time 3   Period Weeks   Status On-going           PT Long Term Goals - 03/14/16 1110      PT LONG TERM GOAL #1   Title Pt to be independent in an advance home exercise program to allow pt to decrease low back pain to no greater than a 2/10 to allow pt to walk for an hour and a half to allow pt to walk in comfort   Time 6   Period Weeks    Status Partially Met     PT LONG TERM GOAL #2   Title Pt to state that he is able to void with 50% more ease   Time 6   Period Weeks   Status On-going     PT LONG TERM GOAL #3   Title Pt to state that he is able to defecate with 50% more ease    Time 6   Period Weeks   Status On-going               Plan - 03/14/16 1109    Clinical Impression Statement Pt states that when he does prone SLR it causes his neck to hurt therefore this exercise was avoided.  Added ball isometrics to improve lower abdominal strength.    Rehab Potential Good   PT Frequency 2x / week   PT Duration 6 weeks   PT Treatment/Interventions ADLs/Self Care Home Management;Therapeutic activities;Therapeutic exercise;Manual techniques;Neuromuscular re-education;Electrical Stimulation;Cryotherapy;Moist Heat   PT Next Visit Plan Pt to be seen for core strengthening and postural education.   PT Home Exercise Plan given   Consulted and Agree with Plan of Care Patient      Patient will benefit from skilled therapeutic intervention in order to improve the following deficits and impairments:  Decreased range of motion, Decreased strength, Pain, Increased fascial restricitons, Postural dysfunction  Visit Diagnosis: Bilateral low back pain with sciatica, sciatica laterality unspecified  Difficulty in walking, not elsewhere classified  Cramp and spasm  Sacrococcygeal disorders, not elsewhere classified     Problem List There are no active problems to display for this patient.   [name], PT CLT [id number] 03/14/2016, 11:19 AM  [place], [place], [address] Phone: [phone]   Fax:  [phone]  Name: Patrick Kerr MRN: [id number] Date of Birth: [birth year]  PHYSICAL THERAPY DISCHARGE SUMMARY  Visits from Start of Care: 8  Current functional level related to goals / functional outcomes: Pt still having a difficult  time getting his  urine to flow.  States bowel movements are easier.  Pain is still present in penis and testicles but  Not as severe as when he started.   Remaining deficits: Pt began having neck pain from prone exercises    Education / Equipment: HEP Plan: Patient agrees to discharge.  Patient goals were partially met. Patient is being discharged due to the patient's request.  ?????          [name], [place] CLT [phone]

## 2016-03-16 ENCOUNTER — Telehealth (HOSPITAL_COMMUNITY): Payer: Self-pay

## 2016-03-16 NOTE — Telephone Encounter (Signed)
Pt wanted to cx all future appts.  He said that a vertebrae was giving him trouble.

## 2016-03-17 ENCOUNTER — Encounter (HOSPITAL_COMMUNITY): Payer: Medicare Other

## 2016-03-21 ENCOUNTER — Encounter (HOSPITAL_COMMUNITY): Payer: Medicare Other | Admitting: Physical Therapy

## 2016-03-22 ENCOUNTER — Encounter (HOSPITAL_COMMUNITY): Payer: Medicare Other

## 2016-03-24 ENCOUNTER — Encounter (HOSPITAL_COMMUNITY): Payer: Medicare Other | Admitting: Physical Therapy

## 2016-03-28 ENCOUNTER — Encounter (HOSPITAL_COMMUNITY): Payer: Medicare Other | Admitting: Physical Therapy

## 2016-03-31 ENCOUNTER — Encounter (HOSPITAL_COMMUNITY): Payer: Medicare Other | Admitting: Physical Therapy

## 2016-04-05 ENCOUNTER — Encounter (HOSPITAL_COMMUNITY): Payer: Medicare Other | Admitting: Physical Therapy

## 2016-06-21 ENCOUNTER — Encounter: Attending: Urology

## 2016-06-22 ENCOUNTER — Ambulatory Visit: Admit: 2016-06-22 | Discharge: 2016-06-22 | Payer: MEDICARE | Attending: Urology

## 2016-06-22 DIAGNOSIS — R300 Dysuria: Secondary | ICD-10-CM

## 2016-06-22 LAB — AMB POC URINALYSIS DIP STICK AUTO W/ MICRO (MICRO RESULTS)
Bilirubin (UA POC): NEGATIVE
Blood (UA POC): NEGATIVE
Crystals (UA POC): NEGATIVE
Epithelial cells (UA POC): 0
Glucose (UA POC): NEGATIVE
Ketones (UA POC): NEGATIVE
Leukocyte esterase (UA POC): NEGATIVE
Nitrites (UA POC): POSITIVE
Protein (UA POC): NEGATIVE
RBCs (UA POC): 0
Specific gravity (UA POC): 1.01 (ref 1.001–1.035)
Urobilinogen (UA POC): 0.2 (ref 0.2–1)
WBCs (UA POC): 0
pH (UA POC): 6 (ref 4.6–8.0)

## 2016-06-22 LAB — AMB POC PVR, MEAS,POST-VOID RES,US,NON-IMAGING: PVR: 62 cc

## 2016-06-22 MED ORDER — TAMSULOSIN SR 0.4 MG 24 HR CAP
0.4 mg | ORAL_CAPSULE | Freq: Every evening | ORAL | 12 refills | Status: DC
Start: 2016-06-22 — End: 2017-08-11

## 2016-06-22 MED ORDER — TRIMETHOPRIM-SULFAMETHOXAZOLE 80 MG-400 MG TAB
80-400 mg | ORAL_TABLET | Freq: Two times a day (BID) | ORAL | 0 refills | Status: AC
Start: 2016-06-22 — End: 2016-07-06

## 2016-06-22 NOTE — Progress Notes (Signed)
HISTORY OF PRESENT ILLNESS:  Jonathan CritchleyGary D Durham is a 70 y.o. male who is seen in consultation for c/o UTI. Pt w/ BPH on Flomax. Pt states that he has moderate dysuria and moderate, aching discomfort in scrotum and buttox. Pt reports nocturia x2 and some hesitancy. Pt reports that he had a UTI approx 3 wks ago and was given Cipro. Pt has been taking pyridium for approx 1 yr. Pt has been seeing another urologist and wanted a second opinion.     AUA Symptom Score 06/22/2016   Over the past month how often have you had the sensation that your bladder was not completely empty after you finished urinating? 0   Over the past month, how often have had to urinate again less than 2 hours after you last finished urinating? 1   Over the past month, how often have you found you stopped and started again several times when you urinated? 2   Over the past month, how often have you found it difficult to postpone urination? 1   Over the past month, how often have you had a weak urinary stream? 2   Over the past month, how often have you had to push or strain to begin urinating? 0   Over the past month, how many times did you most typically get up to urinate from the time you went to bed at night until the time you got up in the morning? 2   AUA Score 8   If you were to spend the rest of your life with your urinary condition the way it is now, how would you feel about that? Mostly satisified       Past Medical History:   Diagnosis Date   ??? Kidney stones    ??? Urinary tract disease        Past Surgical History:   Procedure Laterality Date   ??? HX BACK SURGERY     ??? HX LITHOTRIPSY     ??? HX TURP         Social History   Substance Use Topics   ??? Smoking status: Former Smoker     Quit date: 08/01/1997   ??? Smokeless tobacco: Never Used   ??? Alcohol use No       Allergies   Allergen Reactions   ??? Azithromycin Other (comments)     Causes ulcers       Family History   Problem Relation Age of Onset   ??? No Known Problems Mother     ??? No Known Problems Father    ??? No Known Problems Sister    ??? No Known Problems Brother        Current Outpatient Prescriptions   Medication Sig Dispense Refill   ??? levothyroxine (SYNTHROID) 150 mcg tablet      ??? sertraline (ZOLOFT) 100 mg tablet Take  by mouth daily.     ??? hydroCHLOROthiazide (HYDRODIURIL) 25 mg tablet Take 25 mg by mouth daily.     ??? LORazepam (ATIVAN) 1 mg tablet Take  by mouth every four (4) hours as needed for Anxiety.     ??? cyanocobalamin (VITAMIN B12) 1,000 mcg/mL injection 1,000 mcg by IntraMUSCular route once.     ??? acetaminophen (TYLENOL) 325 mg tablet Take  by mouth every four (4) hours as needed for Pain.     ??? Cholecalciferol, Vitamin D3, 3,000 unit tab Take  by mouth.     ??? ibuprofen 200 mg cap Take  by mouth.     ???  tamsulosin (FLOMAX) 0.4 mg capsule Take 2 Caps by mouth nightly. 60 Cap 12   ??? trimethoprim-sulfamethoxazole (BACTRIM, SEPTRA) 80-400 mg per tablet Take 1 Tab by mouth two (2) times a day for 14 days. 28 Tab 0   ??? montelukast (SINGULAIR) 10 mg tablet TAKE 1 TABLET BY MOUTH AT BEDTIME  4   ??? PROLENSA 0.07 % ophthalmic solution INSTILL 1 DROP IN THE RIGHT EYE ONCE A DAY, STARTING THE DAY OF SURGERY, AFTER THE PROCEDURE  1   ??? ketoconazole (NIZORAL) 2 % topical cream APPLY TO AFFECTED AREA ON THE SKIN 2 TIMES DAILY  0   ??? SYNTHROID 50 mcg tablet      ??? trimethoprim-polymyxin b (POLYTRIM) ophthalmic solution INSTILL 1 DROP IN THE RIGHT EYE 4 TIMES A DAY STARTING THE DAY OF SURGERY, AFTER THE PROCEDURE  1   ??? prednisoLONE acetate (PRED FORTE) 1 % ophthalmic suspension INSTILL 1 DROP IN THE RIGHT EYE 4 TIMES A DAY STARTING THE DAY OF SURGERY, AFTER THE PROCEDURE  1   ??? TRAVATAN Z 0.004 % ophthalmic solution INSTILL 1 DROP INTO BOTH EYES BEFORE BEDTIME  12       Review of Systems  Constitutional: Fever: No  Skin: Rash: No  HEENT: Hearing difficulty: No  Eyes: Blurred vision: No  Cardiovascular: Chest pain: No  Respiratory: Shortness of breath: No   Gastrointestinal: Nausea/vomiting: No  Musculoskeletal: Back pain: No  Neurological: Weakness: No  Psychological: Memory loss: No  Comments/additional findings:       PHYSICAL EXAMINATION:   Visit Vitals   ??? BP 119/66   ??? Pulse 86   ??? Temp 98.8 ??F (37.1 ??C)   ??? Resp 18   ??? Ht 6\' 4"  (1.93 m)   ??? Wt 240 lb 3.2 oz (109 kg)   ??? BMI 29.24 kg/m2     Constitutional: WDWN, Pleasant and appropriate affect, No acute distress.    CV:  No peripheral swelling noted  Respiratory: No respiratory distress or difficulties  Abdomen:  No abdominal masses or tenderness. No CVA tenderness. No inguinal hernias noted.   GU Male:    [id number] normal to visual inspection, no erythema or irritation. Sphincter with good tone. Rectum with no hemorrhoids, fissures or masses. Prostate smooth, symmetric and anodular. Prostate is moderately enlarged.  SCROTUM:  No scrotal rash or lesions noticed.  Normal bilateral testes and epididymis.   PENIS: Urethral meatus normal in location and size. No urethral discharge.  Skin: No jaundice.    Neuro/Psych:  Alert and oriented x 3, affect appropriate.   Lymphatic:   No enlarged inguinal lymph nodes.        REVIEW OF LABS AND IMAGING:    Results for orders placed or performed in visit on 06/22/16   AMB POC URINALYSIS DIP STICK AUTO W/ MICRO (MICRO RESULTS)   Result Value Ref Range    Color (UA POC) Yellow     Clarity (UA POC) Clear     Glucose (UA POC) Negative Negative    Bilirubin (UA POC) Negative Negative    Ketones (UA POC) Negative Negative    Specific gravity (UA POC) 1.010 1.001 - 1.035    Blood (UA POC) Negative Negative    pH (UA POC) 6.0 4.6 - 8.0    Protein (UA POC) Negative Negative    Urobilinogen (UA POC) 0.2 mg/dL 0.2 - 1    Nitrites (UA POC) Positive Negative    Leukocyte esterase (UA POC) Negative Negative  Epithelial cells (UA POC) 0     WBCs (UA POC) 0      RBCs (UA POC) 0      Bacteria (UA POC) None Negative    Crystals (UA POC) Negative Negative    Other (UA POC)      AMB POC PVR, MEAS,POST-VOID RES,US,NON-IMAGING   Result Value Ref Range    PVR 62 cc       ASSESSMENT:   1. Dysuria    2. Acute prostatitis    3. Benign non-nodular prostatic hyperplasia with lower urinary tract symptoms    4. Acute cystitis without hematuria         PLAN:    ?? Told to stop pyridium due to risk of renal failure with long-term use  ?? Chem 7 and PSA today  ?? Increase Flomax to 0.8mg   ?? Rx Bactrim BID for 14 days  ?? F/u 2.5 wks  ?? Will request notes from outside urologist  ?? Possible cysto in the future    Chief Complaint   Patient presents with   ??? UTI     ?     Medical documentation provided with the assistance of Isac SarnaKimber N Moore, medical scribe for [employer] LifeChristi Darleene Cumpian, DO, [address] Carson LaneFACOS     Quinnie Barcelo Bailey LakesHughart, [place]

## 2016-07-13 ENCOUNTER — Ambulatory Visit: Admit: 2016-07-13 | Discharge: 2016-07-13 | Payer: MEDICARE | Attending: Urology

## 2016-07-13 DIAGNOSIS — R300 Dysuria: Secondary | ICD-10-CM

## 2016-07-13 LAB — AMB POC URINALYSIS DIP STICK AUTO W/ MICRO (MICRO RESULTS)
Bilirubin (UA POC): NEGATIVE
Blood (UA POC): NEGATIVE
Crystals (UA POC): NEGATIVE
Epithelial cells (UA POC): 0
Glucose (UA POC): NEGATIVE
Ketones (UA POC): NEGATIVE
Leukocyte esterase (UA POC): NEGATIVE
Nitrites (UA POC): NEGATIVE
Protein (UA POC): NEGATIVE
RBCs (UA POC): 0
Specific gravity (UA POC): 1.02 (ref 1.001–1.035)
Urobilinogen (UA POC): 0.2 (ref 0.2–1)
WBCs (UA POC): 0
pH (UA POC): 5.5 (ref 4.6–8.0)

## 2016-07-13 LAB — AMB POC PVR, MEAS,POST-VOID RES,US,NON-IMAGING: PVR: 26 cc

## 2016-07-13 NOTE — Progress Notes (Signed)
HISTORY OF PRESENT ILLNESS:  Jonathan Durham is a 70 y.o. male who presents today for f/u recent ?prostatitis w/ c/o dysuria, discomfort in scrotum and perineum. Flomax increased to 0.8mg . Cr 1.1. Lab did not draw PSA. Pt does have hx of stones according to outside notes.    Pt reports doing better. Still has discomfort in scrotum and perineum and dysuria (reports 50-75% improvement). Denies any new back/flank pain.     AUA Symptom Score 06/22/2016   Over the past month how often have you had the sensation that your bladder was not completely empty after you finished urinating? 0   Over the past month, how often have had to urinate again less than 2 hours after you last finished urinating? 1   Over the past month, how often have you found you stopped and started again several times when you urinated? 2   Over the past month, how often have you found it difficult to postpone urination? 1   Over the past month, how often have you had a weak urinary stream? 2   Over the past month, how often have you had to push or strain to begin urinating? 0   Over the past month, how many times did you most typically get up to urinate from the time you went to bed at night until the time you got up in the morning? 2   AUA Score 8   If you were to spend the rest of your life with your urinary condition the way it is now, how would you feel about that? Mostly satisified       Past Medical History:   Diagnosis Date   ??? Kidney stones    ??? Urinary tract disease        Past Surgical History:   Procedure Laterality Date   ??? HX BACK SURGERY     ??? HX LITHOTRIPSY     ??? HX TURP         Social History   Substance Use Topics   ??? Smoking status: Former Smoker     Quit date: 08/01/1997   ??? Smokeless tobacco: Never Used   ??? Alcohol use No       Allergies   Allergen Reactions   ??? Azithromycin Other (comments)     Causes ulcers       Family History   Problem Relation Age of Onset   ??? No Known Problems Mother    ??? No Known Problems Father     ??? No Known Problems Sister    ??? No Known Problems Brother        Current Outpatient Prescriptions   Medication Sig Dispense Refill   ??? montelukast (SINGULAIR) 10 mg tablet TAKE 1 TABLET BY MOUTH AT BEDTIME  4   ??? PROLENSA 0.07 % ophthalmic solution INSTILL 1 DROP IN THE RIGHT EYE ONCE A DAY, STARTING THE DAY OF SURGERY, AFTER THE PROCEDURE  1   ??? ketoconazole (NIZORAL) 2 % topical cream APPLY TO AFFECTED AREA ON THE SKIN 2 TIMES DAILY  0   ??? SYNTHROID 50 mcg tablet      ??? levothyroxine (SYNTHROID) 150 mcg tablet      ??? trimethoprim-polymyxin b (POLYTRIM) ophthalmic solution INSTILL 1 DROP IN THE RIGHT EYE 4 TIMES A DAY STARTING THE DAY OF SURGERY, AFTER THE PROCEDURE  1   ??? prednisoLONE acetate (PRED FORTE) 1 % ophthalmic suspension INSTILL 1 DROP IN THE RIGHT EYE 4 TIMES A DAY STARTING  THE DAY OF SURGERY, AFTER THE PROCEDURE  1   ??? TRAVATAN Z 0.004 % ophthalmic solution INSTILL 1 DROP INTO BOTH EYES BEFORE BEDTIME  12   ??? sertraline (ZOLOFT) 100 mg tablet Take  by mouth daily.     ??? hydroCHLOROthiazide (HYDRODIURIL) 25 mg tablet Take 25 mg by mouth daily.     ??? LORazepam (ATIVAN) 1 mg tablet Take  by mouth every four (4) hours as needed for Anxiety.     ??? cyanocobalamin (VITAMIN B12) 1,000 mcg/mL injection 1,000 mcg by IntraMUSCular route once.     ??? acetaminophen (TYLENOL) 325 mg tablet Take  by mouth every four (4) hours as needed for Pain.     ??? Cholecalciferol, Vitamin D3, 3,000 unit tab Take  by mouth.     ??? ibuprofen 200 mg cap Take  by mouth.     ??? tamsulosin (FLOMAX) 0.4 mg capsule Take 2 Caps by mouth nightly. 60 Cap 12       REVIEW OF SYSTEMS:  Constitutional: Fever: No  Skin: Rash: No  HEENT: Hearing difficulty: No  Eyes: Blurred vision: No  Cardiovascular: Chest pain: No  Respiratory: Shortness of breath: No  Gastrointestinal: Nausea/vomiting: No  Musculoskeletal: Back pain: No  Neurological: Weakness: No  Psychological: Memory loss: No  Comments/additional findings:       PHYSICAL EXAMINATION:    Visit Vitals   ??? BP 128/72   ??? Pulse 81   ??? Temp 97.6 ??F (36.4 ??C)   ??? Resp 16   ??? Ht 6\' 4"  (1.93 m)   ??? Wt 243 lb (110.2 kg)   ??? BMI 29.58 kg/m2     Constitutional: Well developed, no acute distress.   Eyes:  Conjunctiva normal.  Ears:  External ear normal.  Nose/Throat:  External nose normal.  CV:  Heart rate regular, No peripheral swelling noted.  Respiratory: No respiratory distress, No audible wheeze.  Skin: No rash, No ulcer.    Neuro/Psych:  Patient with appropriate affect.  Alert and oriented x 3.    Gait:  Normal.      REVIEW OF LABS AND IMAGING:      Results for orders placed or performed in visit on 07/13/16   AMB POC URINALYSIS DIP STICK AUTO W/ MICRO (MICRO RESULTS)   Result Value Ref Range    Color (UA POC) Yellow     Clarity (UA POC) Clear     Glucose (UA POC) Negative Negative    Bilirubin (UA POC) Negative Negative    Ketones (UA POC) Negative Negative    Specific gravity (UA POC) 1.020 1.001 - 1.035    Blood (UA POC) Negative Negative    pH (UA POC) 5.5 4.6 - 8.0    Protein (UA POC) Negative Negative    Urobilinogen (UA POC) 0.2 mg/dL 0.2 - 1    Nitrites (UA POC) Negative Negative    Leukocyte esterase (UA POC) Negative Negative    Epithelial cells (UA POC) 0     WBCs (UA POC) 0      RBCs (UA POC) 0      Bacteria (UA POC) None Negative    Crystals (UA POC) Negative Negative    Other (UA POC)     AMB POC PVR, MEAS,POST-VOID RES,US,NON-IMAGING   Result Value Ref Range    PVR 26 cc       ASSESSMENT:     ICD-10-CM ICD-9-CM    1. Dysuria R30.0 788.1    2. Benign non-nodular prostatic  hyperplasia with lower urinary tract symptoms N40.1 600.91 AMB POC URINALYSIS DIP STICK AUTO W/ MICRO (MICRO RESULTS)      AMB POC PVR, MEAS,POST-VOID RES,US,NON-IMAGING   3. Pelvic pain R10.2 IMO0002    4. Personal history of urinary (tract) infections Z87.440 V13.02    5. Personal history of kidney stones Z87.442 V13.01         PLAN:    ?? PSA in 2 wks  ?? Schedule for Urodynamics  ?? Cysto after UDS      Patient's BMI is out of the normal parameters.  Information about BMI was given and patient was advised to follow-up with their PCP for further management.    Chief Complaint   Patient presents with   ??? UTI     Medical documentation provided with the assistance of Isac SarnaKimber N Moore, medical scribe for [employer] LifeChristi Keyleigh Manninen, DO, [place]FAC[place].    Shawnese Magner TuckerHughart, DO

## 2016-07-26 ENCOUNTER — Telehealth

## 2016-07-26 NOTE — Telephone Encounter (Signed)
Left messaging reminding patient that Dr. Cleda MccreedyHughart is requesting his psa to be done this week per last office note. Order is in patient pick-up folder.

## 2016-07-27 LAB — PSA, DIAGNOSTIC (PROSTATE SPECIFIC AG): Prostate Specific Ag: 1.5 ng/ml

## 2016-07-28 ENCOUNTER — Encounter

## 2016-08-17 ENCOUNTER — Encounter

## 2016-08-18 ENCOUNTER — Encounter

## 2016-08-25 ENCOUNTER — Encounter: Attending: Urology

## 2016-09-22 ENCOUNTER — Ambulatory Visit: Admit: 2016-09-22 | Discharge: 2016-09-22 | Payer: MEDICARE

## 2016-09-22 DIAGNOSIS — N401 Enlarged prostate with lower urinary tract symptoms: Secondary | ICD-10-CM

## 2016-09-22 NOTE — Progress Notes (Signed)
Amie CritchleyGary D Palazzo is a 71 y.o. male who is here today for Urodynamic testing per Dr. Cleda MccreedyHughart???s order.   Dr. Cleda MccreedyHughart was available in the clinic as incident to provider. Testing was performed by [facility].    Patient instructed to report any symptoms of fever, chills, urinary frequency, burning, dribbling, hesitation in starting the stream of urine as well as cloudiness or any other unusual color or characteristic of the urine to our office.  Patient verbalized understanding of the above.  Questions were answered      Orders Placed This Encounter   ??? PR ELECTRO-UROFLOWMETRY, FIRST   ??? PR COMPLEX CYSTOMETROGRAM VOIDING PRESSURE STUDIES   ??? PR ANAL/URINARY MUSCLE STUDY   ??? PR VOIDING PRESS STUDY INTRA-ABDOMINAL VOID     Reviewed by [facility] Louis[facility]i Valeriano Bain, DO.

## 2016-09-29 ENCOUNTER — Encounter: Attending: Urology

## 2016-10-06 ENCOUNTER — Encounter: Attending: Urology

## 2016-10-06 ENCOUNTER — Ambulatory Visit: Admit: 2016-10-06 | Discharge: 2016-10-06 | Payer: MEDICARE | Attending: Urology

## 2016-10-06 DIAGNOSIS — R102 Pelvic and perineal pain: Secondary | ICD-10-CM

## 2016-10-06 LAB — AMB POC URINALYSIS DIP STICK AUTO W/ MICRO (MICRO RESULTS)
Bilirubin (UA POC): NEGATIVE
Blood (UA POC): NEGATIVE
Crystals (UA POC): NEGATIVE
Epithelial cells (UA POC): 0
Glucose (UA POC): NEGATIVE
Ketones (UA POC): NEGATIVE
Leukocyte esterase (UA POC): NEGATIVE
Nitrites (UA POC): NEGATIVE
Protein (UA POC): NEGATIVE
RBCs (UA POC): 0
Specific gravity (UA POC): 1.015 (ref 1.001–1.035)
Urobilinogen (UA POC): 0.2 (ref 0.2–1)
WBCs (UA POC): 0
pH (UA POC): 6 (ref 4.6–8.0)

## 2016-10-06 MED ORDER — FINASTERIDE 5 MG TAB
5 mg | ORAL_TABLET | Freq: Every day | ORAL | 12 refills | Status: AC
Start: 2016-10-06 — End: ?

## 2016-10-06 NOTE — Progress Notes (Signed)
HISTORY OF PRESENT ILLNESS:  Jonathan Durham is a 71 y.o. male who presents today for H&P for UroLift procedure. Cysto showed regrowth from prior TURP. Prostate volume 28.27g.       AUA Symptom Score 10/06/2016   Over the past month how often have you had the sensation that your bladder was not completely empty after you finished urinating? 1   Over the past month, how often have had to urinate again less than 2 hours after you last finished urinating? 1   Over the past month, how often have you found you stopped and started again several times when you urinated? 1   Over the past month, how often have you found it difficult to postpone urination? 1   Over the past month, how often have you had a weak urinary stream? 1   Over the past month, how often have you had to push or strain to begin urinating? 1   Over the past month, how many times did you most typically get up to urinate from the time you went to bed at night until the time you got up in the morning? 2   AUA Score 8   If you were to spend the rest of your life with your urinary condition the way it is now, how would you feel about that? Mostly dissatisfied       Past Medical History:   Diagnosis Date   ??? Kidney stones    ??? Urinary tract disease        Past Surgical History:   Procedure Laterality Date   ??? HX BACK SURGERY     ??? HX LITHOTRIPSY     ??? HX TURP         Social History   Substance Use Topics   ??? Smoking status: Former Smoker     Quit date: 08/01/1997   ??? Smokeless tobacco: Never Used   ??? Alcohol use No       Allergies   Allergen Reactions   ??? Azithromycin Other (comments)     Causes ulcers       Family History   Problem Relation Age of Onset   ??? No Known Problems Mother    ??? No Known Problems Father    ??? No Known Problems Sister    ??? No Known Problems Brother        Current Outpatient Prescriptions   Medication Sig Dispense Refill   ??? finasteride (PROSCAR) 5 mg tablet Take 1 Tab by mouth daily. 30 Tab 12    ??? montelukast (SINGULAIR) 10 mg tablet TAKE 1 TABLET BY MOUTH AT BEDTIME  4   ??? PROLENSA 0.07 % ophthalmic solution INSTILL 1 DROP IN THE RIGHT EYE ONCE A DAY, STARTING THE DAY OF SURGERY, AFTER THE PROCEDURE  1   ??? ketoconazole (NIZORAL) 2 % topical cream APPLY TO AFFECTED AREA ON THE SKIN 2 TIMES DAILY  0   ??? SYNTHROID 50 mcg tablet      ??? levothyroxine (SYNTHROID) 150 mcg tablet      ??? trimethoprim-polymyxin b (POLYTRIM) ophthalmic solution INSTILL 1 DROP IN THE RIGHT EYE 4 TIMES A DAY STARTING THE DAY OF SURGERY, AFTER THE PROCEDURE  1   ??? prednisoLONE acetate (PRED FORTE) 1 % ophthalmic suspension INSTILL 1 DROP IN THE RIGHT EYE 4 TIMES A DAY STARTING THE DAY OF SURGERY, AFTER THE PROCEDURE  1   ??? TRAVATAN Z 0.004 % ophthalmic solution INSTILL 1 DROP INTO BOTH EYES  BEFORE BEDTIME  12   ??? sertraline (ZOLOFT) 100 mg tablet Take  by mouth daily.     ??? hydroCHLOROthiazide (HYDRODIURIL) 25 mg tablet Take 25 mg by mouth daily.     ??? LORazepam (ATIVAN) 1 mg tablet Take  by mouth every four (4) hours as needed for Anxiety.     ??? cyanocobalamin (VITAMIN B12) 1,000 mcg/mL injection 1,000 mcg by IntraMUSCular route once.     ??? acetaminophen (TYLENOL) 325 mg tablet Take  by mouth every four (4) hours as needed for Pain.     ??? Cholecalciferol, Vitamin D3, 3,000 unit tab Take  by mouth.     ??? ibuprofen 200 mg cap Take  by mouth.     ??? tamsulosin (FLOMAX) 0.4 mg capsule Take 2 Caps by mouth nightly. 60 Cap 12       REVIEW OF SYSTEMS:  Constitutional: Fever:   Skin: Rash:   HEENT: Hearing difficulty:   Eyes: Blurred vision:   Cardiovascular: Chest pain:   Respiratory: Shortness of breath:   Gastrointestinal: Nausea/vomiting:   Musculoskeletal: Back pain:   Neurological: Weakness:   Psychological: Memory loss:   Comments/additional findings:       PHYSICAL EXAMINATION:   There were no vitals taken for this visit.  Constitutional: Well developed, no acute distress.   Eyes:  Conjunctiva normal.  Ears:  External ear normal.   Nose/Throat:  External nose normal.  CV:  Heart rate regular, No peripheral swelling noted.  Respiratory: No respiratory distress, No audible wheeze.  Skin: No rash, No ulcer.    Neuro/Psych:  Patient with appropriate affect.  Alert and oriented x 3.    Gait:  Normal.      REVIEW OF LABS AND IMAGING:      Results for orders placed or performed in visit on 10/06/16   AMB POC URINALYSIS DIP STICK AUTO W/ MICRO (MICRO RESULTS)   Result Value Ref Range    Color (UA POC) Yellow     Clarity (UA POC) Clear     Glucose (UA POC) Negative Negative    Bilirubin (UA POC) Negative Negative    Ketones (UA POC) Negative Negative    Specific gravity (UA POC) 1.015 1.001 - 1.035    Blood (UA POC) Negative Negative    pH (UA POC) 6.0 4.6 - 8.0    Protein (UA POC) Negative Negative    Urobilinogen (UA POC) 0.2 mg/dL 0.2 - 1    Nitrites (UA POC) Negative Negative    Leukocyte esterase (UA POC) Negative Negative    Epithelial cells (UA POC) 0     WBCs (UA POC) 0      RBCs (UA POC) 0      Bacteria (UA POC) None Negative    Crystals (UA POC) Negative Negative    Other (UA POC)         ASSESSMENT:     ICD-10-CM ICD-9-CM    1. Pelvic pain R10.2 IMO0002 CYSTOURETHROSCOPY      ECHO,TRANSRECTAL   2. Urgency of urination R39.15 788.63    3. Benign non-nodular prostatic hyperplasia with lower urinary tract symptoms N40.1 600.91 CYSTOURETHROSCOPY      ECHO,TRANSRECTAL      AMB POC URINALYSIS DIP STICK AUTO W/ MICRO (MICRO RESULTS)   4. History of prostatitis Z87.438 V13.89    5. Personal history of urinary (tract) infections Z87.440 V13.02    6. Personal history of kidney stones Z87.442 V13.01        PLAN:    ??  Rx Proscar  ?? Schedule for UroLift procedure    Patient's BMI is out of the normal parameters.  Information about BMI was given and patient was advised to follow-up with their PCP for further management.    Chief Complaint   Patient presents with   ??? Benign Prostatic Hypertrophy      Medical documentation provided with the assistance of Isac SarnaKimber N Moore, medical scribe for [employer]i Keyuana Wank, [place], [place].    Natanael Saladin NewarkHughart, DO

## 2016-10-06 NOTE — Progress Notes (Signed)
Urolift Screen    10/06/16    Patient Name: Jonathan CritchleyGary D Durham         Universal Procedure Pause:    1. Correct patient confirmed:  yes    2. Allergies confirmed:  yes    3. Patient taking antiplatelet medications:  no    4. Patient taking anticoagulants:  no    5. Correct procedure and side confirmed and consent signed:  yes    6. Correct antibiotics confirmed:  yes        Pre-procedure Diagnosis:     ICD-10-CM ICD-9-CM    1. Pelvic pain R10.2 IMO0002 CYSTOURETHROSCOPY      ECHO,TRANSRECTAL   2. Urgency of urination R39.15 788.63    3. Benign non-nodular prostatic hyperplasia with lower urinary tract symptoms N40.1 600.91 CYSTOURETHROSCOPY      ECHO,TRANSRECTAL      AMB POC URINALYSIS DIP STICK AUTO W/ MICRO (MICRO RESULTS)   4. History of prostatitis Z87.438 V13.89    5. Personal history of urinary (tract) infections Z87.440 V13.02    6. Personal history of kidney stones Z87.442 V13.01       Here for UroLift screening and f/u Urodynamics done for hx of recurrent UTIs and prostatitis and c/o urgency. Also c/o discomfort in scrotum and perineum w/ dysuria. PSA 1.5. Pt w/ hx of stones and BPH w/ 2 previous TURPs. Urodynamics shows DO at 460mL w/ PVR of 150cc.    Post-procedure Diagnosis: SAME    Consent:  All risks, benefits and options were reviewed in detail and the patient agrees to procedure. Risks include but are not limited to bleeding, infection, sepsis, death, dysuria and others.     There were no vitals taken for this visit.    Procedure:  The patient was placed in the supine position, and prepped and draped in the normal fashion. 5 ml of 4% Lidocaine gel was placed in the urethra. Once adequate anesthesia was achieved; the flexible cystoscope was placed into the urethra.     Findings as follows:      Meatus: normal  Urethra: normal  Prostate: moderate to severe obstruction w/ signs of TURP regrowth  Median lobe:  no  Bladder neck: normal  Trigone:  normal  Trabeculation: 3+  Diverticuli:  none   Lesion:  none    Antibiotic provided:  Yes     Transrectal ultrasound prostate volume determination:    Patient placed in left lateral decubitus position with knees flexed to chest. Ultrasound wand placed in rectum after lubricated. Prostate volume:  28.27g.  Hypoechoic nodules: no    Lab / Imaging:   Results for orders placed or performed in visit on 10/06/16   AMB POC URINALYSIS DIP STICK AUTO W/ MICRO (MICRO RESULTS)   Result Value Ref Range    Color (UA POC) Yellow     Clarity (UA POC) Clear     Glucose (UA POC) Negative Negative    Bilirubin (UA POC) Negative Negative    Ketones (UA POC) Negative Negative    Specific gravity (UA POC) 1.015 1.001 - 1.035    Blood (UA POC) Negative Negative    pH (UA POC) 6.0 4.6 - 8.0    Protein (UA POC) Negative Negative    Urobilinogen (UA POC) 0.2 mg/dL 0.2 - 1    Nitrites (UA POC) Negative Negative    Leukocyte esterase (UA POC) Negative Negative    Epithelial cells (UA POC) 0     WBCs (UA POC) 0  RBCs (UA POC) 0      Bacteria (UA POC) None Negative    Crystals (UA POC) Negative Negative    Other (UA POC)          Diagnoses:     ICD-10-CM ICD-9-CM    1. Pelvic pain R10.2 IMO0002 CYSTOURETHROSCOPY      ECHO,TRANSRECTAL   2. Urgency of urination R39.15 788.63    3. Benign non-nodular prostatic hyperplasia with lower urinary tract symptoms N40.1 600.91 CYSTOURETHROSCOPY      ECHO,TRANSRECTAL      AMB POC URINALYSIS DIP STICK AUTO W/ MICRO (MICRO RESULTS)   4. History of prostatitis Z87.438 V13.89    5. Personal history of urinary (tract) infections Z87.440 V13.02    6. Personal history of kidney stones Z87.442 V13.01         SUMMARY OF VISIT AND PLAN:  ?? Schedule for UroLift procedure    Patient's BMI is out of the normal parameters.  Information about BMI was given and patient was advised to follow-up with their PCP for further management.    Medical documentation provided with the assistance of Isac Sarna, medical scribe for [employer], DO, [address] [place], [place]

## 2016-10-10 ENCOUNTER — Encounter

## 2016-10-18 NOTE — Progress Notes (Signed)
Called Jonathan Durham to see how he is doing after surgery.    Patient is s/pCysto, Uroliftdone October 17, 2016.   Called patient's cell and home no ans. left message, patient was told to call the office with any questions or concerns.    Patient has a follow-up appointment: Yes November 07, 2016.

## 2016-11-07 ENCOUNTER — Encounter: Attending: Urology

## 2016-11-08 ENCOUNTER — Encounter: Attending: Urology

## 2016-11-09 ENCOUNTER — Ambulatory Visit: Admit: 2016-11-09 | Discharge: 2016-11-09 | Payer: MEDICARE | Attending: Urology

## 2016-11-09 DIAGNOSIS — R351 Nocturia: Secondary | ICD-10-CM

## 2016-11-09 LAB — AMB POC PVR, MEAS,POST-VOID RES,US,NON-IMAGING: PVR: 7 cc

## 2016-11-09 NOTE — Progress Notes (Signed)
HISTORY OF PRESENT ILLNESS:  Jonathan Durham is a 71 y.o. male who presents today for 3 wks f/u UroLift procedure. Hx of prostatitis, urgency and hx of TURP x2. On Flomax and finasteride. IPSS pre-op was 8 and pt was dissatisfied, now 3.    Pt is very satisfied w/ urinary symptoms. Nocturia still x2. No other complaints.    AUA Symptom Score 11/09/2016   Over the past month how often have you had the sensation that your bladder was not completely empty after you finished urinating? 0   Over the past month, how often have had to urinate again less than 2 hours after you last finished urinating? 0   Over the past month, how often have you found you stopped and started again several times when you urinated? 0   Over the past month, how often have you found it difficult to postpone urination? 1   Over the past month, how often have you had a weak urinary stream? 0   Over the past month, how often have you had to push or strain to begin urinating? 0   Over the past month, how many times did you most typically get up to urinate from the time you went to bed at night until the time you got up in the morning? 2   AUA Score 3   If you were to spend the rest of your life with your urinary condition the way it is now, how would you feel about that? Mostly satisfied       Past Medical History:   Diagnosis Date   ??? Kidney stones    ??? Urinary tract disease        Past Surgical History:   Procedure Laterality Date   ??? HX BACK SURGERY     ??? HX LITHOTRIPSY     ??? HX TURP         Social History   Substance Use Topics   ??? Smoking status: Former Smoker     Quit date: 08/01/1997   ??? Smokeless tobacco: Never Used   ??? Alcohol use No       Allergies   Allergen Reactions   ??? Amitriptyline Other (comments)     Elavil makes him "hyper".   ??? Azithromycin Other (comments)     Causes ulcers   ??? Erythromycin (Bulk) Other (comments)     Caused ulcer.   ??? Mirabegron Other (comments)     Headache   ??? Prednisone Other (comments)      Numbness, pt states feet felt like they were on fire   ??? Hydromorphone Itching       Family History   Problem Relation Age of Onset   ??? No Known Problems Mother    ??? No Known Problems Father    ??? No Known Problems Sister    ??? No Known Problems Brother        Current Outpatient Prescriptions   Medication Sig Dispense Refill   ??? finasteride (PROSCAR) 5 mg tablet Take 1 Tab by mouth daily. 30 Tab 12   ??? montelukast (SINGULAIR) 10 mg tablet TAKE 1 TABLET BY MOUTH AT BEDTIME  4   ??? PROLENSA 0.07 % ophthalmic solution INSTILL 1 DROP IN THE RIGHT EYE ONCE A DAY, STARTING THE DAY OF SURGERY, AFTER THE PROCEDURE  1   ??? ketoconazole (NIZORAL) 2 % topical cream APPLY TO AFFECTED AREA ON THE SKIN 2 TIMES DAILY  0   ??? SYNTHROID 50 mcg  tablet      ??? levothyroxine (SYNTHROID) 150 mcg tablet      ??? trimethoprim-polymyxin b (POLYTRIM) ophthalmic solution INSTILL 1 DROP IN THE RIGHT EYE 4 TIMES A DAY STARTING THE DAY OF SURGERY, AFTER THE PROCEDURE  1   ??? prednisoLONE acetate (PRED FORTE) 1 % ophthalmic suspension INSTILL 1 DROP IN THE RIGHT EYE 4 TIMES A DAY STARTING THE DAY OF SURGERY, AFTER THE PROCEDURE  1   ??? TRAVATAN Z 0.004 % ophthalmic solution INSTILL 1 DROP INTO BOTH EYES BEFORE BEDTIME  12   ??? sertraline (ZOLOFT) 100 mg tablet Take  by mouth daily.     ??? hydroCHLOROthiazide (HYDRODIURIL) 25 mg tablet Take 25 mg by mouth daily.     ??? LORazepam (ATIVAN) 1 mg tablet Take  by mouth every four (4) hours as needed for Anxiety.     ??? cyanocobalamin (VITAMIN B12) 1,000 mcg/mL injection 1,000 mcg by IntraMUSCular route once.     ??? acetaminophen (TYLENOL) 325 mg tablet Take  by mouth every four (4) hours as needed for Pain.     ??? Cholecalciferol, Vitamin D3, 3,000 unit tab Take  by mouth.     ??? ibuprofen 200 mg cap Take  by mouth.     ??? tamsulosin (FLOMAX) 0.4 mg capsule Take 2 Caps by mouth nightly. 60 Cap 12       REVIEW OF SYSTEMS:  Constitutional: Fever: No  Skin: Rash: No  HEENT: Hearing difficulty: No   Eyes: Blurred vision: No  Cardiovascular: Chest pain: No  Respiratory: Shortness of breath: No  Gastrointestinal: Nausea/vomiting: No  Musculoskeletal: Back pain: No  Neurological: Weakness: No  Psychological: Memory loss: No  Comments/additional findings:       PHYSICAL EXAMINATION:   Visit Vitals   ??? BP 150/80   ??? Pulse 80   ??? Temp 98.8 ??F (37.1 ??C)   ??? Resp 18   ??? Ht  (1.93 m)   ??? Wt 243 lb (110.2 kg)   ??? BMI 29.58 kg/m2     Constitutional: Well developed, no acute distress.   Eyes:  Conjunctiva normal.  Ears:  External ear normal.  Nose/Throat:  External nose normal.  CV:  Heart rate regular. No peripheral swelling noted.  Respiratory: No respiratory distress. No audible wheeze.  Skin: No rash. No ulcer.    Neuro/Psych:  Patient with appropriate affect.  Alert and oriented x 3.    Gait:  Normal.      REVIEW OF LABS AND IMAGING:      Results for orders placed or performed in visit on 11/09/16   AMB POC PVR, MEAS,POST-VOID RES,US,NON-IMAGING   Result Value Ref Range    PVR 7 cc       ASSESSMENT:     ICD-10-CM ICD-9-CM    1. Benign prostatic hyperplasia with nocturia N40.1 600.01 AMB POC PVR, MEAS,POST-VOID RES,US,NON-IMAGING    R35.1 788.43        PLAN:    ?? Cont Flomax and finasteride  ?? F/u 3 wks to reassess    Patient's BMI is out of the normal parameters.  Information about BMI was given and patient was advised to follow-up with their PCP for further management.    Chief Complaint   Patient presents with   ??? Benign Prostatic Hypertrophy     Medical documentation provided with the assistance of Isac Sarna, medical scribe for [employer], [place], [place].    Mattheus Rauls [place], DO

## 2016-11-30 ENCOUNTER — Encounter: Attending: Urology

## 2016-11-30 ENCOUNTER — Ambulatory Visit: Admit: 2016-11-30 | Discharge: 2016-11-30 | Payer: MEDICARE | Attending: Urology

## 2016-11-30 DIAGNOSIS — N401 Enlarged prostate with lower urinary tract symptoms: Secondary | ICD-10-CM

## 2016-11-30 LAB — AMB POC URINALYSIS DIP STICK AUTO W/ MICRO (MICRO RESULTS)
Bilirubin (UA POC): NEGATIVE
Blood (UA POC): NEGATIVE
Crystals (UA POC): NEGATIVE
Epithelial cells (UA POC): 0
Glucose (UA POC): NEGATIVE
Ketones (UA POC): NEGATIVE
Leukocyte esterase (UA POC): NEGATIVE
Nitrites (UA POC): NEGATIVE
Protein (UA POC): NEGATIVE
RBCs (UA POC): 0
Specific gravity (UA POC): 1.015 (ref 1.001–1.035)
Urobilinogen (UA POC): 0.2 (ref 0.2–1)
WBCs (UA POC): 0
pH (UA POC): 6.5 (ref 4.6–8.0)

## 2016-11-30 LAB — AMB POC PVR, MEAS,POST-VOID RES,US,NON-IMAGING: PVR: 7 cc

## 2016-11-30 NOTE — Progress Notes (Signed)
HISTORY OF PRESENT ILLNESS:  Jonathan Durham is a 71 y.o. male who presents today for 6 wks f/u UroLift procedure. Hx of prostatitis, urgency and hx of TURP x2. On Flomax and finasteride. IPSS pre-op was 8 and pt was dissatisfied, now 2.    Pt reports occasional dysuria. Nocturia x2. No other LUTS. Pt is satisfied.    AUA Symptom Score 11/30/2016   Over the past month how often have you had the sensation that your bladder was not completely empty after you finished urinating? 0   Over the past month, how often have had to urinate again less than 2 hours after you last finished urinating? 0   Over the past month, how often have you found you stopped and started again several times when you urinated? 0   Over the past month, how often have you found it difficult to postpone urination? 0   Over the past month, how often have you had a weak urinary stream? 0   Over the past month, how often have you had to push or strain to begin urinating? 0   Over the past month, how many times did you most typically get up to urinate from the time you went to bed at night until the time you got up in the morning? 2   AUA Score 2   If you were to spend the rest of your life with your urinary condition the way it is now, how would you feel about that? Pleased       Past Medical History:   Diagnosis Date   ??? Kidney stones    ??? Urinary tract disease        Past Surgical History:   Procedure Laterality Date   ??? HX BACK SURGERY     ??? HX LITHOTRIPSY     ??? HX TURP         Social History   Substance Use Topics   ??? Smoking status: Former Smoker     Quit date: 08/01/1997   ??? Smokeless tobacco: Never Used   ??? Alcohol use No       Allergies   Allergen Reactions   ??? Amitriptyline Other (comments)     Elavil makes him "hyper".   ??? Azithromycin Other (comments)     Causes ulcers   ??? Erythromycin (Bulk) Other (comments)     Caused ulcer.   ??? Mirabegron Other (comments)     Headache   ??? Prednisone Other (comments)      Numbness, pt states feet felt like they were on fire   ??? Hydromorphone Itching       Family History   Problem Relation Age of Onset   ??? No Known Problems Mother    ??? No Known Problems Father    ??? No Known Problems Sister    ??? No Known Problems Brother        Current Outpatient Prescriptions   Medication Sig Dispense Refill   ??? finasteride (PROSCAR) 5 mg tablet Take 1 Tab by mouth daily. 30 Tab 12   ??? montelukast (SINGULAIR) 10 mg tablet TAKE 1 TABLET BY MOUTH AT BEDTIME  4   ??? PROLENSA 0.07 % ophthalmic solution INSTILL 1 DROP IN THE RIGHT EYE ONCE A DAY, STARTING THE DAY OF SURGERY, AFTER THE PROCEDURE  1   ??? ketoconazole (NIZORAL) 2 % topical cream APPLY TO AFFECTED AREA ON THE SKIN 2 TIMES DAILY  0   ??? SYNTHROID 50 mcg tablet      ???  levothyroxine (SYNTHROID) 150 mcg tablet      ??? trimethoprim-polymyxin b (POLYTRIM) ophthalmic solution INSTILL 1 DROP IN THE RIGHT EYE 4 TIMES A DAY STARTING THE DAY OF SURGERY, AFTER THE PROCEDURE  1   ??? prednisoLONE acetate (PRED FORTE) 1 % ophthalmic suspension INSTILL 1 DROP IN THE RIGHT EYE 4 TIMES A DAY STARTING THE DAY OF SURGERY, AFTER THE PROCEDURE  1   ??? TRAVATAN Z 0.004 % ophthalmic solution INSTILL 1 DROP INTO BOTH EYES BEFORE BEDTIME  12   ??? sertraline (ZOLOFT) 100 mg tablet Take  by mouth daily.     ??? hydroCHLOROthiazide (HYDRODIURIL) 25 mg tablet Take 25 mg by mouth daily.     ??? LORazepam (ATIVAN) 1 mg tablet Take  by mouth every four (4) hours as needed for Anxiety.     ??? cyanocobalamin (VITAMIN B12) 1,000 mcg/mL injection 1,000 mcg by IntraMUSCular route once.     ??? acetaminophen (TYLENOL) 325 mg tablet Take  by mouth every four (4) hours as needed for Pain.     ??? Cholecalciferol, Vitamin D3, 3,000 unit tab Take  by mouth.     ??? ibuprofen 200 mg cap Take  by mouth.     ??? tamsulosin (FLOMAX) 0.4 mg capsule Take 2 Caps by mouth nightly. 60 Cap 12       REVIEW OF SYSTEMS:  Constitutional: Fever: No  Skin: Rash: No  HEENT: Hearing difficulty: No   Eyes: Blurred vision: No  Cardiovascular: Chest pain: No  Respiratory: Shortness of breath: No  Gastrointestinal: Nausea/vomiting: No  Musculoskeletal: Back pain: No  Neurological: Weakness: No  Psychological: Memory loss: No  Comments/additional findings:       PHYSICAL EXAMINATION:   Visit Vitals   ??? BP 120/80   ??? Pulse 79   ??? Temp 99 ??F (37.2 ??C)   ??? Resp 18   ??? Ht 6\' 4"  (1.93 m)   ??? Wt 243 lb (110.2 kg)   ??? BMI 29.58 kg/m2     Constitutional: Well developed, no acute distress.   Eyes:  Conjunctiva normal.  Ears:  External ear normal.  Nose/Throat:  External nose normal.  CV:  Heart rate regular. No peripheral swelling noted.  Respiratory: No respiratory distress. No audible wheeze.  Skin: No rash. No ulcer.    Neuro/Psych:  Patient with appropriate affect.  Alert and oriented x 3.    Gait:  Normal.      REVIEW OF LABS AND IMAGING:      Results for orders placed or performed in visit on 11/30/16   AMB POC URINALYSIS DIP STICK AUTO W/ MICRO (MICRO RESULTS)   Result Value Ref Range    Color (UA POC) Yellow     Clarity (UA POC) Clear     Glucose (UA POC) Negative Negative    Bilirubin (UA POC) Negative Negative    Ketones (UA POC) Negative Negative    Specific gravity (UA POC) 1.015 1.001 - 1.035    Blood (UA POC) Negative Negative    pH (UA POC) 6.5 4.6 - 8.0    Protein (UA POC) Negative Negative    Urobilinogen (UA POC) 0.2 mg/dL 0.2 - 1    Nitrites (UA POC) Negative Negative    Leukocyte esterase (UA POC) Negative Negative    Epithelial cells (UA POC) 0     WBCs (UA POC) 0      RBCs (UA POC) 0      Bacteria (UA POC) None Negative  Crystals (UA POC) Negative Negative    Other (UA POC)     AMB POC PVR, MEAS,POST-VOID RES,US,NON-IMAGING   Result Value Ref Range    PVR 7 cc       ASSESSMENT:     ICD-10-CM ICD-9-CM    1. Benign prostatic hyperplasia with nocturia N40.1 600.01 CUSTOMER REQUEST URINE CULTURE    R35.1 788.43    2. Dysuria R30.0 788.1 AMB POC URINALYSIS DIP STICK AUTO W/ MICRO (MICRO RESULTS)       AMB POC PVR, MEAS,POST-VOID RES,US,NON-IMAGING   3. History of prostatitis Z87.438 V13.89        PLAN:    ?? Cont Flomax and finasteride  ?? F/u 3 months to reassess  ?? Will send urine culture due to dysuria    Patient's BMI is out of the normal parameters.  Information about BMI was given and patient was advised to follow-up with their PCP for further management.    Chief Complaint   Patient presents with   ??? Benign Prostatic Hypertrophy     Medical documentation provided with the assistance of Isac SarnaKimber N Moore, medical scribe for [employer] Lakita Sahlin, [place], [place].    Indria Bishara ClydeHughart, DO

## 2016-12-02 NOTE — Telephone Encounter (Signed)
Patient will be in Monday to give an urine sample. No dysuria, nausea, vomiting, back pain or fever at this time.

## 2016-12-02 NOTE — Progress Notes (Signed)
Get patient in for urine culture = send to [facility] and tell them I need sensitivities regardless of how many bacteria are seen

## 2016-12-02 NOTE — Telephone Encounter (Signed)
-----   Message from [facility]i Hughart, [place]DO sent at 12/02/2016 12:55 PM EDT -----  Get patient in for urine culture = send to [facility] and tell them I need sensitivities regardless of how many bacteria are seen

## 2016-12-05 ENCOUNTER — Encounter

## 2016-12-05 ENCOUNTER — Institutional Professional Consult (permissible substitution): Admit: 2016-12-05 | Discharge: 2016-12-05 | Payer: MEDICARE

## 2016-12-05 DIAGNOSIS — N401 Enlarged prostate with lower urinary tract symptoms: Secondary | ICD-10-CM

## 2016-12-05 NOTE — Progress Notes (Signed)
Amie CritchleyGary D Felker is here today for urine culture per Dr. Cleda MccreedyHughart order because patient is experiencing the below symptoms.  Dr. Cleda MccreedyHughart was in the office as incident to.        Visit Vitals   ??? Temp 97.9 ??F (36.6 ??C)   ??? Ht 6\' 4"  (1.93 m)   ??? Wt 243 lb (110.2 kg)   ??? BMI 29.58 kg/m2        Patient c/o odor to urine NO  Decreased output: NO  Difficulty voiding: NO  Burning with urination: YES  Incontinence:  NO  Split Stream: NO    Associated signs and symptoms:    none    The pain is mild.  Severity:  4    Urine was obtained by:  Clean catch     Any recent Urologic surgeries or procedures:  No  If so - what type of surgery:  N/A    Any History of Stones:  NO     History of bladder tumor:  NO  Recurrent UTI's:  NO    This is a repeat urine culture.   UA performed: No    Results for orders placed or performed in visit on 11/30/16   AMB POC URINALYSIS DIP STICK AUTO W/ MICRO (MICRO RESULTS)   Result Value Ref Range    Color (UA POC) Yellow     Clarity (UA POC) Clear     Glucose (UA POC) Negative Negative    Bilirubin (UA POC) Negative Negative    Ketones (UA POC) Negative Negative    Specific gravity (UA POC) 1.015 1.001 - 1.035    Blood (UA POC) Negative Negative    pH (UA POC) 6.5 4.6 - 8.0    Protein (UA POC) Negative Negative    Urobilinogen (UA POC) 0.2 mg/dL 0.2 - 1    Nitrites (UA POC) Negative Negative    Leukocyte esterase (UA POC) Negative Negative    Epithelial cells (UA POC) 0     WBCs (UA POC) 0      RBCs (UA POC) 0      Bacteria (UA POC) None Negative    Crystals (UA POC) Negative Negative    Other (UA POC)     AMB POC PVR, MEAS,POST-VOID RES,US,NON-IMAGING   Result Value Ref Range    PVR 7 cc        [facility] of urine culture.   Patient is informed that it will be at least 48 hours before results are available     No orders of the defined types were placed in this encounter.         Josephine Igoonja Ivory Maduro, CMA   Reviewed by Estelle Junehristi Hughart, DO.

## 2016-12-07 MED ORDER — NITROFURANTOIN MACROCRYSTAL 100 MG CAP
100 mg | ORAL_CAPSULE | Freq: Two times a day (BID) | ORAL | 0 refills | Status: DC
Start: 2016-12-07 — End: 2017-03-01

## 2016-12-07 NOTE — Telephone Encounter (Signed)
-----   Message from [facility] HealthChristi Hughart, [place]DO sent at 12/07/2016  9:05 AM EDT -----  Call in nitrofurantoin 100mg  one po bid for 10 days- no refills.

## 2016-12-07 NOTE — Progress Notes (Signed)
Get in for apt in 2 wks for f/u uti

## 2016-12-07 NOTE — Telephone Encounter (Signed)
Jonathan CornfieldStephanie spoke to patient and patient verbalized understanding.

## 2016-12-07 NOTE — Telephone Encounter (Signed)
Patient was called and a detailed msg was left for patient to return call and aslo antb. Was called in to [facility] in [place], [place].

## 2016-12-07 NOTE — Progress Notes (Signed)
Call in nitrofurantoin 100mg  one po bid for 10 days- no refills.

## 2016-12-21 ENCOUNTER — Encounter

## 2016-12-21 ENCOUNTER — Ambulatory Visit: Admit: 2016-12-21 | Discharge: 2016-12-21 | Payer: MEDICARE | Attending: Urology

## 2016-12-21 DIAGNOSIS — N410 Acute prostatitis: Secondary | ICD-10-CM

## 2016-12-21 LAB — AMB POC URINALYSIS DIP STICK AUTO W/ MICRO (MICRO RESULTS)
Bilirubin (UA POC): NEGATIVE
Blood (UA POC): NEGATIVE
Crystals (UA POC): NEGATIVE
Epithelial cells (UA POC): 0
Glucose (UA POC): NEGATIVE
Ketones (UA POC): NEGATIVE
Leukocyte esterase (UA POC): NEGATIVE
Nitrites (UA POC): NEGATIVE
Protein (UA POC): NEGATIVE
RBCs (UA POC): 0
Specific gravity (UA POC): 1.015 (ref 1.001–1.035)
Urobilinogen (UA POC): 0.2 (ref 0.2–1)
WBCs (UA POC): 0
pH (UA POC): 7 (ref 4.6–8.0)

## 2016-12-21 NOTE — Progress Notes (Signed)
HISTORY OF PRESENT ILLNESS:  Jonathan Durham is a 71 y.o. male who presents today for f/u recent UTI w/ c/o mild dysuria. Pt s/p UroLift procedure on 10/17/16. Hx of prostatitis, urgency and hx of TURP x2. On Flomax and finasteride. IPSS pre-op was 8 and pt was dissatisfied, last visit 2.    Pt reports that dysuria is still present off and on during the day. Seems to worsen w/ acidic drinks. Nocturia x2 and urgency resolved.    AUA Symptom Score 11/30/2016   Over the past month how often have you had the sensation that your bladder was not completely empty after you finished urinating? 0   Over the past month, how often have had to urinate again less than 2 hours after you last finished urinating? 0   Over the past month, how often have you found you stopped and started again several times when you urinated? 0   Over the past month, how often have you found it difficult to postpone urination? 0   Over the past month, how often have you had a weak urinary stream? 0   Over the past month, how often have you had to push or strain to begin urinating? 0   Over the past month, how many times did you most typically get up to urinate from the time you went to bed at night until the time you got up in the morning? 2   AUA Score 2   If you were to spend the rest of your life with your urinary condition the way it is now, how would you feel about that? Pleased       Past Medical History:   Diagnosis Date   ??? Kidney stones    ??? Urinary tract disease        Past Surgical History:   Procedure Laterality Date   ??? HX BACK SURGERY     ??? HX LITHOTRIPSY     ??? HX TURP         Social History   Substance Use Topics   ??? Smoking status: Former Smoker     Quit date: 08/01/1997   ??? Smokeless tobacco: Never Used   ??? Alcohol use No       Allergies   Allergen Reactions   ??? Amitriptyline Other (comments)     Elavil makes him "hyper".   ??? Azithromycin Other (comments)     Causes ulcers   ??? Erythromycin (Bulk) Other (comments)     Caused ulcer.    ??? Mirabegron Other (comments)     Headache   ??? Prednisone Other (comments)     Numbness, pt states feet felt like they were on fire   ??? Hydromorphone Itching       Family History   Problem Relation Age of Onset   ??? No Known Problems Mother    ??? No Known Problems Father    ??? No Known Problems Sister    ??? No Known Problems Brother        Current Outpatient Prescriptions   Medication Sig Dispense Refill   ??? nitrofurantoin (MACRODANTIN) 100 mg capsule Take 1 Cap by mouth two (2) times daily (with meals). 20 Cap 0   ??? finasteride (PROSCAR) 5 mg tablet Take 1 Tab by mouth daily. 30 Tab 12   ??? montelukast (SINGULAIR) 10 mg tablet TAKE 1 TABLET BY MOUTH AT BEDTIME  4   ??? PROLENSA 0.07 % ophthalmic solution INSTILL 1 DROP IN THE RIGHT  EYE ONCE A DAY, STARTING THE DAY OF SURGERY, AFTER THE PROCEDURE  1   ??? ketoconazole (NIZORAL) 2 % topical cream APPLY TO AFFECTED AREA ON THE SKIN 2 TIMES DAILY  0   ??? SYNTHROID 50 mcg tablet      ??? levothyroxine (SYNTHROID) 150 mcg tablet      ??? trimethoprim-polymyxin b (POLYTRIM) ophthalmic solution INSTILL 1 DROP IN THE RIGHT EYE 4 TIMES A DAY STARTING THE DAY OF SURGERY, AFTER THE PROCEDURE  1   ??? prednisoLONE acetate (PRED FORTE) 1 % ophthalmic suspension INSTILL 1 DROP IN THE RIGHT EYE 4 TIMES A DAY STARTING THE DAY OF SURGERY, AFTER THE PROCEDURE  1   ??? TRAVATAN Z 0.004 % ophthalmic solution INSTILL 1 DROP INTO BOTH EYES BEFORE BEDTIME  12   ??? sertraline (ZOLOFT) 100 mg tablet Take  by mouth daily.     ??? hydroCHLOROthiazide (HYDRODIURIL) 25 mg tablet Take 25 mg by mouth daily.     ??? LORazepam (ATIVAN) 1 mg tablet Take  by mouth every four (4) hours as needed for Anxiety.     ??? cyanocobalamin (VITAMIN B12) 1,000 mcg/mL injection 1,000 mcg by IntraMUSCular route once.     ??? acetaminophen (TYLENOL) 325 mg tablet Take  by mouth every four (4) hours as needed for Pain.     ??? Cholecalciferol, Vitamin D3, 3,000 unit tab Take  by mouth.     ??? ibuprofen 200 mg cap Take  by mouth.      ??? tamsulosin (FLOMAX) 0.4 mg capsule Take 2 Caps by mouth nightly. 60 Cap 12       REVIEW OF SYSTEMS:  Constitutional: Fever: No  Skin: Rash: No  HEENT: Hearing difficulty: No  Eyes: Blurred vision: No  Cardiovascular: Chest pain: No  Respiratory: Shortness of breath: No  Gastrointestinal: Nausea/vomiting: No  Musculoskeletal: Back pain: No  Neurological: Weakness: No  Psychological: Memory loss: No  Comments/additional findings:       PHYSICAL EXAMINATION:   Visit Vitals   ??? BP 140/78   ??? Pulse 80   ??? Temp 97.6 ??F (36.4 ??C)   ??? Resp 18   ??? Ht 6\' 4"  (1.93 m)   ??? Wt 243 lb (110.2 kg)   ??? BMI 29.58 kg/m2     Constitutional: Well developed, no acute distress.   Eyes:  Conjunctiva normal.  Ears:  External ear normal.  Nose/Throat:  External nose normal.  CV:  Heart rate regular. No peripheral swelling noted.  Respiratory: No respiratory distress. No audible wheeze.  Skin: No rash. No ulcer.    Neuro/Psych:  Patient with appropriate affect.  Alert and oriented x 3.    Gait:  Normal.      REVIEW OF LABS AND IMAGING:      Results for orders placed or performed in visit on 12/21/16   AMB POC URINALYSIS DIP STICK AUTO W/ MICRO (MICRO RESULTS)   Result Value Ref Range    Color (UA POC) Yellow     Clarity (UA POC) Clear     Glucose (UA POC) Negative Negative    Bilirubin (UA POC) Negative Negative    Ketones (UA POC) Negative Negative    Specific gravity (UA POC) 1.015 1.001 - 1.035    Blood (UA POC) Negative Negative    pH (UA POC) 7.0 4.6 - 8.0    Protein (UA POC) Negative Negative    Urobilinogen (UA POC) 0.2 mg/dL 0.2 - 1    Nitrites (UA POC)  Negative Negative    Leukocyte esterase (UA POC) Negative Negative    Epithelial cells (UA POC) 0     WBCs (UA POC) 0      RBCs (UA POC) 0      Bacteria (UA POC) None Negative    Crystals (UA POC) Negative Negative    Other (UA POC)         ASSESSMENT:     ICD-10-CM ICD-9-CM    1. Acute prostatitis N41.0 601.0 METABOLIC PANEL, BASIC    2. Benign prostatic hyperplasia with nocturia N40.1 600.01     R35.1 788.43    3. Dysuria R30.0 788.1 AMB POC URINALYSIS DIP STICK AUTO W/ MICRO (MICRO RESULTS)      CUSTOMER REQUEST URINE CULTURE       PLAN:    ?? Chem 7 today  ?? Repeat culture sent  ?? Told to take 600mg  Motrin Q 6 hours for 5 days  ?? F/u 2 wks to reassess  ?? Cont Flomax and finasteride  ?? F/u as scheduled in August for f/u UroLift    Patient's BMI is out of the normal parameters.  Information about BMI was given and patient was advised to follow-up with their PCP for further management.    Chief Complaint   Patient presents with   ??? UTI     Medical documentation provided with the assistance of Isac SarnaKimber N Moore, medical scribe for [employer] Efren Kross, DO, [place].    Hatsuko Bizzarro VolenteHughart, DO

## 2017-01-04 ENCOUNTER — Ambulatory Visit: Admit: 2017-01-04 | Discharge: 2017-01-04 | Payer: MEDICARE | Attending: Urology

## 2017-01-04 DIAGNOSIS — N410 Acute prostatitis: Secondary | ICD-10-CM

## 2017-01-04 LAB — AMB POC URINALYSIS DIP STICK AUTO W/ MICRO (MICRO RESULTS)
Bilirubin (UA POC): NEGATIVE
Blood (UA POC): NEGATIVE
Crystals (UA POC): NEGATIVE
Epithelial cells (UA POC): 0
Glucose (UA POC): NEGATIVE
Ketones (UA POC): NEGATIVE
Leukocyte esterase (UA POC): NEGATIVE
Nitrites (UA POC): NEGATIVE
Protein (UA POC): NEGATIVE
RBCs (UA POC): 0
Specific gravity (UA POC): 1.025 (ref 1.001–1.035)
Urobilinogen (UA POC): 0.2 (ref 0.2–1)
WBCs (UA POC): 0
pH (UA POC): 5.5 (ref 4.6–8.0)

## 2017-01-04 LAB — AMB POC PVR, MEAS,POST-VOID RES,US,NON-IMAGING: PVR: 144 cc

## 2017-01-04 NOTE — Progress Notes (Signed)
HISTORY OF PRESENT ILLNESS:  Jonathan Durham is a 71 y.o. male who presents today for f/u prostatitis/UTI treated w/ nitrofurantoin. Chem 7 wnl. Pt s/p UroLift procedure on 10/17/16. Hx of prostatitis, urgency and hx of TURP x2. On Flomax and finasteride. IPSS pre-op was 8 and pt was dissatisfied, now 2.    Pt reports that mild dysuria is still present. Is still taking ibuprofen. Nocturia x1-2. No other complaints.    AUA Symptom Score 01/04/2017   Over the past month how often have you had the sensation that your bladder was not completely empty after you finished urinating? 1   Over the past month, how often have had to urinate again less than 2 hours after you last finished urinating? 1   Over the past month, how often have you found you stopped and started again several times when you urinated? 1   Over the past month, how often have you found it difficult to postpone urination? 1   Over the past month, how often have you had a weak urinary stream? 1   Over the past month, how often have you had to push or strain to begin urinating? 1   Over the past month, how many times did you most typically get up to urinate from the time you went to bed at night until the time you got up in the morning? 2   AUA Score 8   If you were to spend the rest of your life with your urinary condition the way it is now, how would you feel about that? Mostly dissatisfied       Past Medical History:   Diagnosis Date   ??? Kidney stones    ??? Urinary tract disease        Past Surgical History:   Procedure Laterality Date   ??? HX BACK SURGERY     ??? HX LITHOTRIPSY     ??? HX TURP         Social History   Substance Use Topics   ??? Smoking status: Former Smoker     Quit date: 08/01/1997   ??? Smokeless tobacco: Never Used   ??? Alcohol use No       Allergies   Allergen Reactions   ??? Amitriptyline Other (comments)     Elavil makes him "hyper".   ??? Azithromycin Other (comments)     Causes ulcers   ??? Erythromycin (Bulk) Other (comments)      Caused ulcer.   ??? Mirabegron Other (comments)     Headache   ??? Prednisone Other (comments)     Numbness, pt states feet felt like they were on fire   ??? Hydromorphone Itching       Family History   Problem Relation Age of Onset   ??? No Known Problems Mother    ??? No Known Problems Father    ??? No Known Problems Sister    ??? No Known Problems Brother        Current Outpatient Prescriptions   Medication Sig Dispense Refill   ??? chlorhexidine (PERIDEX) 0.12 % solution RINSE 15 ML'S TWICE DAILY AFTER BREAKFAST/BEFORE BEDTIME FOLLOWING BRUSHING AND FLOSSING *04/06*  0   ??? docusate sodium (COLACE) 100 mg capsule Take  by mouth.     ??? lisinopril (PRINIVIL, ZESTRIL) 10 mg tablet Take  by mouth.     ??? finasteride (PROSCAR) 5 mg tablet Take 1 Tab by mouth daily. 30 Tab 12   ??? montelukast (SINGULAIR) 10  mg tablet TAKE 1 TABLET BY MOUTH AT BEDTIME  4   ??? ketoconazole (NIZORAL) 2 % topical cream APPLY TO AFFECTED AREA ON THE SKIN 2 TIMES DAILY  0   ??? sertraline (ZOLOFT) 100 mg tablet Take  by mouth daily.     ??? hydroCHLOROthiazide (HYDRODIURIL) 25 mg tablet Take 25 mg by mouth daily.     ??? LORazepam (ATIVAN) 1 mg tablet Take  by mouth every four (4) hours as needed for Anxiety.     ??? cyanocobalamin (VITAMIN B12) 1,000 mcg/mL injection 1,000 mcg by IntraMUSCular route once.     ??? acetaminophen (TYLENOL) 325 mg tablet Take  by mouth every four (4) hours as needed for Pain.     ??? Cholecalciferol, Vitamin D3, 3,000 unit tab Take  by mouth.     ??? tamsulosin (FLOMAX) 0.4 mg capsule Take 2 Caps by mouth nightly. 60 Cap 12   ??? nitrofurantoin (MACRODANTIN) 50 mg capsule Take 1 Cap by mouth two (2) times a day for 7 days. 14 Cap 0   ??? aspirin delayed-release 81 mg tablet Take  by mouth.     ??? nitrofurantoin (MACRODANTIN) 100 mg capsule Take 1 Cap by mouth two (2) times daily (with meals). 20 Cap 0   ??? PROLENSA 0.07 % ophthalmic solution INSTILL 1 DROP IN THE RIGHT EYE ONCE A DAY, STARTING THE DAY OF SURGERY, AFTER THE PROCEDURE  1    ??? SYNTHROID 50 mcg tablet      ??? levothyroxine (SYNTHROID) 150 mcg tablet      ??? trimethoprim-polymyxin b (POLYTRIM) ophthalmic solution INSTILL 1 DROP IN THE RIGHT EYE 4 TIMES A DAY STARTING THE DAY OF SURGERY, AFTER THE PROCEDURE  1   ??? prednisoLONE acetate (PRED FORTE) 1 % ophthalmic suspension INSTILL 1 DROP IN THE RIGHT EYE 4 TIMES A DAY STARTING THE DAY OF SURGERY, AFTER THE PROCEDURE  1   ??? TRAVATAN Z 0.004 % ophthalmic solution INSTILL 1 DROP INTO BOTH EYES BEFORE BEDTIME  12   ??? ibuprofen 200 mg cap Take  by mouth.         REVIEW OF SYSTEMS:  Constitutional: Fever: No  Skin: Rash: No  HEENT: Hearing difficulty: No  Eyes: Blurred vision: No  Cardiovascular: Chest pain: No  Respiratory: Shortness of breath: No  Gastrointestinal: Nausea/vomiting: No  Musculoskeletal: Back pain: No  Neurological: Weakness: No  Psychological: Memory loss: No  Comments/additional findings:       PHYSICAL EXAMINATION:   Visit Vitals   ??? BP 130/76 (BP 1 Location: Left arm, BP Patient Position: Sitting)   ??? Pulse 80   ??? Temp 97 ??F (36.1 ??C) (Oral)   ??? Resp 16   ??? Ht 6\' 4"  (1.93 m)   ??? Wt 260 lb (117.9 kg)   ??? BMI 31.65 kg/m2     Constitutional: Well developed, no acute distress.   Eyes:  Conjunctiva normal.  Ears:  External ear normal.  Nose/Throat:  External nose normal.  CV:  Heart rate regular. No peripheral swelling noted.  Respiratory: No respiratory distress. No audible wheeze.  Skin: No rash. No ulcer.    Neuro/Psych:  Patient with appropriate affect.  Alert and oriented x 3.    Gait:  Normal.      REVIEW OF LABS AND IMAGING:      Results for orders placed or performed in visit on 01/04/17   AMB POC URINALYSIS DIP STICK AUTO W/ MICRO (MICRO RESULTS)   Result Value Ref  Range    Color (UA POC) Yellow     Clarity (UA POC) Clear     Glucose (UA POC) Negative Negative    Bilirubin (UA POC) Negative Negative    Ketones (UA POC) Negative Negative    Specific gravity (UA POC) 1.025 1.001 - 1.035    Blood (UA POC) Negative Negative     pH (UA POC) 5.5 4.6 - 8.0    Protein (UA POC) Negative Negative    Urobilinogen (UA POC) 0.2 mg/dL 0.2 - 1    Nitrites (UA POC) Negative Negative    Leukocyte esterase (UA POC) Negative Negative    Epithelial cells (UA POC) 0     WBCs (UA POC) 0      RBCs (UA POC) 0      Bacteria (UA POC) None Negative    Crystals (UA POC) Negative Negative    Other (UA POC)     AMB POC PVR, MEAS,POST-VOID RES,US,NON-IMAGING   Result Value Ref Range    PVR 144 cc       ASSESSMENT:     ICD-10-CM ICD-9-CM    1. Acute prostatitis N41.0 601.0 aspirin delayed-release 81 mg tablet      chlorhexidine (PERIDEX) 0.12 % solution      docusate sodium (COLACE) 100 mg capsule      lisinopril (PRINIVIL, ZESTRIL) 10 mg tablet      AMB POC URINALYSIS DIP STICK AUTO W/ MICRO (MICRO RESULTS)      AMB POC PVR, MEAS,POST-VOID RES,US,NON-IMAGING      CUSTOMER REQUEST URINE CULTURE      CANCELED: CUSTOMER REQUEST URINE CULTURE   2. Acute cystitis without hematuria N30.00 595.0 aspirin delayed-release 81 mg tablet      chlorhexidine (PERIDEX) 0.12 % solution      docusate sodium (COLACE) 100 mg capsule      lisinopril (PRINIVIL, ZESTRIL) 10 mg tablet      AMB POC URINALYSIS DIP STICK AUTO W/ MICRO (MICRO RESULTS)      AMB POC PVR, MEAS,POST-VOID RES,US,NON-IMAGING      CUSTOMER REQUEST URINE CULTURE      CANCELED: CUSTOMER REQUEST URINE CULTURE   3. Benign prostatic hyperplasia with nocturia N40.1 600.01 aspirin delayed-release 81 mg tablet    R35.1 788.43 chlorhexidine (PERIDEX) 0.12 % solution      docusate sodium (COLACE) 100 mg capsule      lisinopril (PRINIVIL, ZESTRIL) 10 mg tablet      AMB POC URINALYSIS DIP STICK AUTO W/ MICRO (MICRO RESULTS)      AMB POC PVR, MEAS,POST-VOID RES,US,NON-IMAGING      CUSTOMER REQUEST URINE CULTURE      CANCELED: CUSTOMER REQUEST URINE CULTURE   4. Dysuria R30.0 788.1 aspirin delayed-release 81 mg tablet      chlorhexidine (PERIDEX) 0.12 % solution      docusate sodium (COLACE) 100 mg capsule       lisinopril (PRINIVIL, ZESTRIL) 10 mg tablet      AMB POC URINALYSIS DIP STICK AUTO W/ MICRO (MICRO RESULTS)      AMB POC PVR, MEAS,POST-VOID RES,US,NON-IMAGING      CUSTOMER REQUEST URINE CULTURE      CANCELED: CUSTOMER REQUEST URINE CULTURE       PLAN:    ?? Culture sent  ?? Cont Flomax and finasteride  ?? F/u as scheduled in August    Patient's BMI is out of the normal parameters.  Information about BMI was given and patient was advised to follow-up with their PCP  for further management.    Chief Complaint   Patient presents with   ??? Prostatitis     Medical documentation provided with the assistance of Isac Sarna, medical scribe for [employer], [place], [place].    Stephnie Parlier [place], DO

## 2017-01-06 MED ORDER — NITROFURANTOIN MACROCRYSTAL 50 MG CAP
50 mg | ORAL_CAPSULE | Freq: Two times a day (BID) | ORAL | 0 refills | Status: AC
Start: 2017-01-06 — End: 2017-01-13

## 2017-01-06 NOTE — Telephone Encounter (Signed)
Left message informing that Dr. Winnifred Friarobey was covering for Dr. Cleda MccreedyHughart and is calling in a prescription for him to the pharmacy on record.

## 2017-03-01 ENCOUNTER — Ambulatory Visit: Admit: 2017-03-01 | Discharge: 2017-03-01 | Payer: MEDICARE | Attending: Urology

## 2017-03-01 DIAGNOSIS — N401 Enlarged prostate with lower urinary tract symptoms: Secondary | ICD-10-CM

## 2017-03-01 NOTE — Progress Notes (Signed)
HISTORY OF PRESENT ILLNESS:  Jonathan Durham is a 71 y.o. male who presents today for 36mo f/u urolift done for hx uti, prostatitis, urgency (resolved), nocturia 1-2 (same).  Patient reports occasional discomfort at tip of penis which is chronic.  On flomax and finasteride.     IPSS 8 to 2 to 4.      AUA Symptom Score 03/01/2017   Over the past month how often have you had the sensation that your bladder was not completely empty after you finished urinating? 1   Over the past month, how often have had to urinate again less than 2 hours after you last finished urinating? 1   Over the past month, how often have you found you stopped and started again several times when you urinated? 1   Over the past month, how often have you found it difficult to postpone urination? 0   Over the past month, how often have you had a weak urinary stream? 1   Over the past month, how often have you had to push or strain to begin urinating? 1   Over the past month, how many times did you most typically get up to urinate from the time you went to bed at night until the time you got up in the morning? 2   AUA Score 7   If you were to spend the rest of your life with your urinary condition the way it is now, how would you feel about that? Mostly satisfied       Past Medical History:   Diagnosis Date   ??? Kidney stones    ??? Urinary tract disease        Past Surgical History:   Procedure Laterality Date   ??? HX BACK SURGERY     ??? HX LITHOTRIPSY     ??? HX TURP         Social History   Substance Use Topics   ??? Smoking status: Former Smoker     Quit date: 08/01/1997   ??? Smokeless tobacco: Never Used   ??? Alcohol use No       Allergies   Allergen Reactions   ??? Amitriptyline Other (comments)     Elavil makes him "hyper".   ??? Azithromycin Other (comments)     Causes ulcers   ??? Erythromycin (Bulk) Other (comments)     Caused ulcer.   ??? Mirabegron Other (comments)     Headache   ??? Prednisone Other (comments)      Numbness, pt states feet felt like they were on fire   ??? Hydromorphone Itching       Family History   Problem Relation Age of Onset   ??? No Known Problems Mother    ??? No Known Problems Father    ??? No Known Problems Sister    ??? No Known Problems Brother        Current Outpatient Prescriptions   Medication Sig Dispense Refill   ??? levothyroxine (SYNTHROID) 137 mcg tablet Take  by mouth Daily (before breakfast).     ??? atorvastatin (LIPITOR) 20 mg tablet Take  by mouth daily.     ??? finasteride (PROSCAR) 5 mg tablet Take 1 Tab by mouth daily. 30 Tab 12   ??? sertraline (ZOLOFT) 100 mg tablet Take  by mouth daily.     ??? hydroCHLOROthiazide (HYDRODIURIL) 25 mg tablet Take 25 mg by mouth daily.     ??? LORazepam (ATIVAN) 1 mg tablet Take  by  mouth every four (4) hours as needed for Anxiety.     ??? cyanocobalamin (VITAMIN B12) 1,000 mcg/mL injection 1,000 mcg by IntraMUSCular route once.     ??? acetaminophen (TYLENOL) 325 mg tablet Take  by mouth every four (4) hours as needed for Pain.     ??? Cholecalciferol, Vitamin D3, 3,000 unit tab Take  by mouth.     ??? ibuprofen 200 mg cap Take  by mouth.     ??? tamsulosin (FLOMAX) 0.4 mg capsule Take 2 Caps by mouth nightly. 60 Cap 12   ??? chlorhexidine (PERIDEX) 0.12 % solution RINSE 15 ML'S TWICE DAILY AFTER BREAKFAST/BEFORE BEDTIME FOLLOWING BRUSHING AND FLOSSING *04/06*  0         REVIEW OF SYSTEMS:  Constitutional: Fever: No  Skin: Rash: No  HEENT: Hearing difficulty: No  Eyes: Blurred vision: No  Cardiovascular: Chest pain: No  Respiratory: Shortness of breath: No  Gastrointestinal: Nausea/vomiting: No  Musculoskeletal: Back pain: Yes  Neurological: Weakness: No  Psychological: Memory loss: No    PHYSICAL EXAMINATION:   There were no vitals taken for this visit.  Constitutional: Well developed, no acute distress.   Eyes:  Conjunctiva normal.  Ears:  External ear normal.  Nose/Throat:  External nose normal.  CV:  Heart rate regular, No peripheral swelling noted.   Respiratory: No respiratory distress, No audible wheeze.  Skin: No rash, No ulcer.    Neuro/Psych:  Patient with appropriate affect.  Alert and oriented x 3.    Gait:  Normal.      REVIEW OF LABS AND IMAGING:      Results for orders placed or performed in visit on 01/04/17   AMB POC URINALYSIS DIP STICK AUTO W/ MICRO (MICRO RESULTS)   Result Value Ref Range    Color (UA POC) Yellow     Clarity (UA POC) Clear     Glucose (UA POC) Negative Negative    Bilirubin (UA POC) Negative Negative    Ketones (UA POC) Negative Negative    Specific gravity (UA POC) 1.025 1.001 - 1.035    Blood (UA POC) Negative Negative    pH (UA POC) 5.5 4.6 - 8.0    Protein (UA POC) Negative Negative    Urobilinogen (UA POC) 0.2 mg/dL 0.2 - 1    Nitrites (UA POC) Negative Negative    Leukocyte esterase (UA POC) Negative Negative    Epithelial cells (UA POC) 0     WBCs (UA POC) 0      RBCs (UA POC) 0      Bacteria (UA POC) None Negative    Crystals (UA POC) Negative Negative    Other (UA POC)     AMB POC PVR, MEAS,POST-VOID RES,US,NON-IMAGING   Result Value Ref Range    PVR 144 cc       ASSESSMENT:     ICD-10-CM ICD-9-CM    1. Benign prostatic hyperplasia with nocturia N40.1 600.01     R35.1 788.43    2. History of prostatitis Z87.438 V13.89    3. Urgency of urination R39.15 788.63    4. Personal history of urinary (tract) infections Z87.440 V13.02               PLAN:    ?? Doing well  ?? F/u 1 yr         Chief Complaint   Patient presents with   ??? Benign Prostatic Hypertrophy         A copy of today's office  visit was sent to the referring physician.        Medical documentation provided with the assistance of Kimber N Moore, medical scribe for [employer] Riccardi, DO, [place].    Lady Wisham [place], DO

## 2017-04-13 ENCOUNTER — Ambulatory Visit (INDEPENDENT_AMBULATORY_CARE_PROVIDER_SITE_OTHER): Payer: Medicare Other | Admitting: Orthopaedic Surgery

## 2017-04-13 ENCOUNTER — Ambulatory Visit (INDEPENDENT_AMBULATORY_CARE_PROVIDER_SITE_OTHER): Payer: Medicare Other

## 2017-04-13 ENCOUNTER — Encounter (INDEPENDENT_AMBULATORY_CARE_PROVIDER_SITE_OTHER): Payer: Self-pay | Admitting: Orthopaedic Surgery

## 2017-04-13 VITALS — BP 139/60 | HR 74 | Ht 76.0 in | Wt 270.0 lb

## 2017-04-13 DIAGNOSIS — M545 Low back pain: Secondary | ICD-10-CM

## 2017-04-13 DIAGNOSIS — G8929 Other chronic pain: Secondary | ICD-10-CM | POA: Diagnosis not present

## 2017-04-13 DIAGNOSIS — M4802 Spinal stenosis, cervical region: Secondary | ICD-10-CM | POA: Diagnosis not present

## 2017-04-13 DIAGNOSIS — M542 Cervicalgia: Secondary | ICD-10-CM

## 2017-04-13 NOTE — Progress Notes (Signed)
Office Visit Note   Patient: Patrick CritchleyGary D Kerr           Date of Birth: [birth year]           MRN: [id number] Visit Date: 04/13/2017              Requested by: No referring provider defined for this encounter. PCP: No primary care provider on file.   Assessment & Plan: Visit Diagnoses:  1. Neck pain   2. Chronic right-sided low back pain, with sciatica presence unspecified   3. Spinal stenosis of cervical region     Plan: Patient has ongoing neck pain post lamina plasties C3 to C6. I discussed and the procedure was done for cervical stenosis multilevel. We'll proceed with the MRI cervical spine to evaluate him for any residual or adjacent stenosis. Office follow-up after scan. He understands he may have some cord myelomalacia cause originally by the  stenosis and there may not be any treatment options that are available to improve his symptoms. Follow-up after MRI scan.  Follow-Up Instructions: After cervical MRI scan.  Orders:  Orders Placed This Encounter  Procedures  . XR Cervical Spine 2 or 3 views  . XR Lumbar Spine 2-3 Views   No orders of the defined types were placed in this encounter.     Procedures: No procedures performed   Clinical Data: No additional findings.   Subjective: Chief Complaint  Patient presents with  . Neck - Pain  . 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 He's had 2 [facility] the first was in 2015 with lamina plasties performed at C3-4-5 and C6 with the laminoplasty plates. He later had instrumented fusion done L3-L5 with cages and pedicle instrumentation. [facility] were done [facility]. He states he is walking better since a lumbar procedure. He's gradually had increase in neck pain over the last 6 months with difficulty sleeping pain when he turns his neck to the side. He's not noticed any radicular symptoms in his upper extremities.  Review of  Systems patient has a history of thyroid removal. Lumbar surgery cervical lamina plasties. History of anxiety depression arthritis bladder problems and a thyroid condition. Patient's married.   Objective: Vital Signs: BP 139/60   Pulse 74   Ht 6\' 4"  (1.93 m)   Wt 270 lb (122.5 kg)   BMI 32.87 kg/m   Physical Exam  Constitutional: He is oriented to person, place, and time. He appears well-developed and well-nourished.  HENT:  Head: Normocephalic and atraumatic.  Eyes: Pupils are equal, round, and reactive to light. EOM are normal.  Neck: No tracheal deviation present. No thyromegaly present.  Cardiovascular: Normal rate.   Pulmonary/Chest: Effort normal. He has no wheezes.  Abdominal: Soft. Bowel sounds are normal.  Neurological: He is alert and oriented to person, place, and time.  Skin: Skin is warm and dry. Capillary refill takes less than 2 seconds.  Psychiatric: He has a normal mood and affect. His behavior is normal. Judgment and thought content normal.    Ortho Exam patient is 34+ reflexes upper extremities biceps triceps brachioradialis. Hearing reach both arms over his head. There is a lower extremity spasticity and 3 beat clonus worse in the left leg and right leg. He is a mature with a short stride mild myelopathic type gait.  Specialty Comments:  No specialty comments available.  Imaging: No results found.  PMFS History: There are no active problems to display for this patient.  No past medical history on file.  No family history on file.  No past surgical history on file. Social History   Occupational History  . Not on file.   Social History Main Topics  . Smoking status: Never Smoker  . Smokeless tobacco: Never Used  . Alcohol use No  . Drug use: No  . Sexual activity: Not on file

## 2017-04-19 ENCOUNTER — Encounter: Payer: Self-pay | Admitting: Orthopaedic Surgery

## 2017-04-20 ENCOUNTER — Ambulatory Visit (INDEPENDENT_AMBULATORY_CARE_PROVIDER_SITE_OTHER): Payer: Medicare Other | Admitting: Orthopaedic Surgery

## 2017-04-20 ENCOUNTER — Encounter (INDEPENDENT_AMBULATORY_CARE_PROVIDER_SITE_OTHER): Payer: Self-pay | Admitting: Orthopaedic Surgery

## 2017-04-20 VITALS — BP 108/50 | HR 86

## 2017-04-20 DIAGNOSIS — M4712 Other spondylosis with myelopathy, cervical region: Secondary | ICD-10-CM

## 2017-04-20 NOTE — Progress Notes (Signed)
Office Visit Note   Patient: Patrick Kerr           Date of Birth: 06/08/1946           MRN: [id number] Visit Date: 04/20/2017              Requested by: No referring provider defined for this encounter. PCP: No primary care provider on file.   Assessment & Plan: Visit Diagnoses:  1. Other spondylosis with myelopathy, cervical region        Is myelopathy appears to be stable by history. He does have foraminal stenosis and narrowing at C5-6 and C6-7 with disc osteophyte complex at C5-6 and disc bulge with moderately severe stenosis worse on the right at C6-7.  Plan: We'll check him back again in 3 months for repeat exam. We discussed 2 level anterior cervical fusion at C5-6 C6-7 if he has increase in symptoms. I gave him a copy of his MRI report for review and we reviewed the images. His cord does not show any myelopathic changes.  Follow-Up Instructions: Return in about 3 months (around 07/20/2017).   Orders:  No orders of the defined types were placed in this encounter.  No orders of the defined types were placed in this encounter.     Procedures: No procedures performed   Clinical Data: No additional findings.   Subjective: Chief Complaint  Patient presents with  . Neck - Follow-up    HPI patient returns has had an MRI scan of the cervical spine. Previous posterior lamina plasties done from C3-C6 with small plates. He has not really noticed any progression of his myelopathy. He has mild residual myelopathy with some numbness in his upper extremities primarily more on his right than left hand and some gait problems. He states these have been stable and he has not noticed any progression over the last 6 months. No associated bowel or bladder symptoms. No following history recently.  Review of Systems 14 point review of systems unchanged from 04/13/2017 office note other than as mentioned in history of present illness   Objective: Vital Signs: BP (!) 108/50   Pulse  86   Physical Exam  Constitutional: He is oriented to person, place, and time. He appears well-developed and well-nourished.  HENT:  Head: Normocephalic and atraumatic.  Eyes: Pupils are equal, round, and reactive to light. EOM are normal.  Neck: No tracheal deviation present. No thyromegaly present.  Cardiovascular: Normal rate.   Pulmonary/Chest: Effort normal. He has no wheezes.  Abdominal: Soft. Bowel sounds are normal.  Neurological: He is alert and oriented to person, place, and time.  Skin: Skin is warm and dry. Capillary refill takes less than 2 seconds.  Psychiatric: He has a normal mood and affect. His behavior is normal. Judgment and thought content normal.    Ortho Exam patient still has 3-4+ hyperreflexia upper extremities biceps, triceps, brachioradialis. Rotator cuff testing shows moderately good strength he can get his arms up over his head. He still has lower extremity clonus and still has a short stride slightly wide-based gait.  Specialty Comments:  No specialty comments available.  Imaging: MRI cervical on 04/19/2017 shows some facet degenerative changes C3-4. Posterior laminectomy C3-C6 and also part of C7. Patient has disc osteophyte complex eccentric to the left at C5-6 and to the right at C6-7 with shallow disc bulge. Moderate to severe foraminal narrowing worse on the right. Central canal is open. Cord shows normal signal throughout.   PMFS History: There  are no active problems to display for this patient.  No past medical history on file.  No family history on file.  No past surgical history on file. Social History   Occupational History  . Not on file.   Social History Main Topics  . Smoking status: Never Smoker  . Smokeless tobacco: Never Used  . Alcohol use No  . Drug use: No  . Sexual activity: Not on file

## 2017-07-10 ENCOUNTER — Encounter: Attending: Urology

## 2017-07-11 ENCOUNTER — Ambulatory Visit: Admit: 2017-07-11 | Discharge: 2017-07-11 | Payer: MEDICARE | Attending: Urology

## 2017-07-11 DIAGNOSIS — N528 Other male erectile dysfunction: Secondary | ICD-10-CM

## 2017-07-11 LAB — AMB POC URINALYSIS DIP STICK AUTO W/ MICRO (MICRO RESULTS)
Bilirubin (UA POC): NEGATIVE
Blood (UA POC): NEGATIVE
Crystals (UA POC): NEGATIVE
Epithelial cells (UA POC): 0
Glucose (UA POC): NEGATIVE
Ketones (UA POC): NEGATIVE
Leukocyte esterase (UA POC): NEGATIVE
Nitrites (UA POC): NEGATIVE
Protein (UA POC): NEGATIVE
RBCs (UA POC): 0
Specific gravity (UA POC): 1.02 (ref 1.001–1.035)
Urobilinogen (UA POC): 0.2 (ref 0.2–1)
WBCs (UA POC): 0
pH (UA POC): 7 (ref 4.6–8.0)

## 2017-07-11 MED ORDER — SILDENAFIL 20 MG TAB
20 mg | ORAL_TABLET | ORAL | 12 refills | Status: DC
Start: 2017-07-11 — End: 2017-08-11

## 2017-07-11 NOTE — Progress Notes (Signed)
HISTORY OF PRESENT ILLNESS:  Jonathan Durham is a 71 y.o. male who presents today for c/o retrograde ejaculation. Pt s/p urolift done for history of UTI, prostatitis, urgency (resolved), nocturia x1-2 (same). Patient w/ occasional discomfort at tip of penis which is chronic. On Flomax and finasteride.    Pt states that retrograde ejaculation is bothersome and has been going on for a couple years. Also has pain in lower back w/ retrograde ejaculation. Pt reports that this pain is 9/10, sharp, stabbing pain. Also has issues w/ ED. Reports that [place] "something to inject in his penis for erections."     AUA Symptom Score 07/11/2017   Over the past month how often have you had the sensation that your bladder was not completely empty after you finished urinating? 1   Over the past month, how often have had to urinate again less than 2 hours after you last finished urinating? 1   Over the past month, how often have you found you stopped and started again several times when you urinated? 1   Over the past month, how often have you found it difficult to postpone urination? 0   Over the past month, how often have you had a weak urinary stream? 1   Over the past month, how often have you had to push or strain to begin urinating? 1   Over the past month, how many times did you most typically get up to urinate from the time you went to bed at night until the time you got up in the morning? 2   AUA Score 7   If you were to spend the rest of your life with your urinary condition the way it is now, how would you feel about that? Mostly satisfied       Past Medical History:   Diagnosis Date   ??? Kidney stones    ??? Urinary tract disease        Past Surgical History:   Procedure Laterality Date   ??? HX BACK SURGERY     ??? HX LITHOTRIPSY     ??? HX TURP         Social History     Tobacco Use   ??? Smoking status: Former Smoker     Last attempt to quit: 08/01/1997      Years since quitting: 19.9   ??? Smokeless tobacco: Never Used   Substance Use Topics   ??? Alcohol use: No   ??? Drug use: No       Allergies   Allergen Reactions   ??? Hydromorphone Itching     Other reaction(s): Confusion   ??? Amitriptyline Other (comments)     Elavil makes him "hyper".   ??? Azithromycin Other (comments)     Causes ulcers  Other reaction(s): Other (See Comments)  Causes ulcers   ??? Erythromycin Nausea and Vomiting   ??? Erythromycin (Bulk) Other (comments)     Caused ulcer.   ??? Mirabegron Other (comments)     Headache  Other reaction(s): Other (See Comments)  Headache   ??? Prednisone Other (comments)     Numbness, pt states feet felt like they were on fire       Family History   Problem Relation Age of Onset   ??? No Known Problems Mother    ??? No Known Problems Father    ??? No Known Problems Sister    ??? No Known  Problems Brother        Current Outpatient Medications   Medication Sig Dispense Refill   ??? aspirin delayed-release 81 mg tablet Take  by mouth.     ??? sildenafil, antihypertensive, (REVATIO) 20 mg tablet Take 3 tablets by mouth 30 minutes to one hour prior to sexual activity on an empty stomach. 30 Tab 12   ??? levothyroxine (SYNTHROID) 137 mcg tablet Take  by mouth Daily (before breakfast).     ??? atorvastatin (LIPITOR) 20 mg tablet Take  by mouth daily.     ??? chlorhexidine (PERIDEX) 0.12 % solution RINSE 15 ML'S TWICE DAILY AFTER BREAKFAST/BEFORE BEDTIME FOLLOWING BRUSHING AND FLOSSING *04/06*  0   ??? finasteride (PROSCAR) 5 mg tablet Take 1 Tab by mouth daily. 30 Tab 12   ??? sertraline (ZOLOFT) 100 mg tablet Take  by mouth daily.     ??? hydroCHLOROthiazide (HYDRODIURIL) 25 mg tablet Take 25 mg by mouth daily.     ??? LORazepam (ATIVAN) 1 mg tablet Take  by mouth every four (4) hours as needed for Anxiety.     ??? cyanocobalamin (VITAMIN B12) 1,000 mcg/mL injection 1,000 mcg by IntraMUSCular route once.     ??? acetaminophen (TYLENOL) 325 mg tablet Take  by mouth every four (4) hours as needed for Pain.      ??? Cholecalciferol, Vitamin D3, 3,000 unit tab Take  by mouth.     ??? ibuprofen 200 mg cap Take  by mouth.     ??? tamsulosin (FLOMAX) 0.4 mg capsule Take 2 Caps by mouth nightly. 60 Cap 12       REVIEW OF SYSTEMS:  Constitutional: Fever: No  Skin: Rash: No  HEENT: Hearing difficulty: No  Eyes: Blurred vision: No  Cardiovascular: Chest pain: No  Respiratory: Shortness of breath: No  Gastrointestinal: Nausea/vomiting: No  Musculoskeletal: Back pain: Yes  Neurological: Weakness: No  Psychological: Memory loss: No      PHYSICAL EXAMINATION:   Visit Vitals  BP 149/89   Pulse (!) 101   Temp 98.2 ??F (36.8 ??C)   Resp 18   Ht 6\' 4"  (1.93 m)   Wt 272 lb (123.4 kg)   BMI 33.11 kg/m??     Constitutional: Well developed, no acute distress.   Eyes:  Conjunctiva normal.  Ears:  External ear normal.  Nose/Throat:  External nose normal.  CV:  Heart rate regular. No peripheral swelling noted.  Respiratory: No respiratory distress. No audible wheeze.  Skin: No rash. No ulcer.    Neuro/Psych:  Patient with appropriate affect.  Alert and oriented x 3.    Gait:  Normal.      REVIEW OF LABS AND IMAGING:      Results for orders placed or performed in visit on 07/11/17   AMB POC URINALYSIS DIP STICK AUTO W/ MICRO (MICRO RESULTS)   Result Value Ref Range    Color (UA POC) Yellow     Clarity (UA POC) Clear     Glucose (UA POC) Negative Negative    Bilirubin (UA POC) Negative Negative    Ketones (UA POC) Negative Negative    Specific gravity (UA POC) 1.020 1.001 - 1.035    Blood (UA POC) Negative Negative    pH (UA POC) 7.0 4.6 - 8.0    Protein (UA POC) Negative Negative    Urobilinogen (UA POC) 0.2 mg/dL 0.2 - 1    Nitrites (UA POC) Negative Negative    Leukocyte esterase (UA POC) Negative Negative  Epithelial cells (UA POC) 0     WBCs (UA POC) 0      RBCs (UA POC) 0      Bacteria (UA POC) None Negative    Crystals (UA POC) Negative Negative    Other (UA POC)         ASSESSMENT:     ICD-10-CM ICD-9-CM     1. Other male erectile dysfunction N52.8 607.84    2. 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 Benign prostatic hyperplasia with nocturia N40.1 600.01 AMB POC URINALYSIS DIP STICK AUTO W/ MICRO (MICRO RESULTS)    R35.1 788.43    4. History of prostatitis Z87.438 V13.89         PLAN:    ?? D/c Flomax  ?? Cont finasteride  ?? Rx Sildenafil 60mg   ?? F/u 3 wks to reassess    Patient's BMI is out of the normal parameters. Information about BMI was given and patient was advised to follow-up with their PCP for further management.    Chief Complaint   Patient presents with   ??? Other     ejaculatory disfunction     Medical documentation provided with the assistance of Isac Sarna, medical scribe for [employer], DO, [place].    Raahi Korber [place], DO

## 2017-07-20 ENCOUNTER — Encounter (INDEPENDENT_AMBULATORY_CARE_PROVIDER_SITE_OTHER): Payer: Self-pay | Admitting: Orthopaedic Surgery

## 2017-07-20 ENCOUNTER — Ambulatory Visit (INDEPENDENT_AMBULATORY_CARE_PROVIDER_SITE_OTHER): Payer: Medicare Other | Admitting: Orthopaedic Surgery

## 2017-07-20 VITALS — BP 113/59 | HR 87

## 2017-07-20 DIAGNOSIS — M5020 Other cervical disc displacement, unspecified cervical region: Secondary | ICD-10-CM | POA: Diagnosis not present

## 2017-07-20 DIAGNOSIS — Z981 Arthrodesis status: Secondary | ICD-10-CM | POA: Insufficient documentation

## 2017-07-20 NOTE — Progress Notes (Signed)
Office Visit Note   Patient: Patrick CritchleyGary D Marin           Date of Birth: [birth year]           MRN: [id number] Visit Date: 07/20/2017              Requested by: No referring provider defined for this encounter. PCP: No primary care provider on file.   Assessment & Plan: Visit Diagnoses:  1. Protrusion of cervical intervertebral disc   2. Status post lumbar spinal fusion     Plan: He does not appear to have any progression of his symptoms unless he does a lot of activity and then has some aching pain still has some weakness in his upper extremities.  We reviewed the cervical MRI which showed some disc protrusion 7 without cord compression since he has had a posterior laminoplasty.  He would like to return in 3 months for recheck.  Follow-Up Instructions: Return in about 3 months (around 10/18/2017).   Orders:  No orders of the defined types were placed in this encounter.  No orders of the defined types were placed in this encounter.     Procedures: No procedures performed   Clinical Data: No additional findings.   Subjective: Chief Complaint  Patient presents with  . Neck - Follow-up  . Lower Back - Follow-up    HPI 71 year old male returns he states he yard work and garden and had some pain in his back.  He was planting holes had some back pain and buttocks pain.  Level instrumented [facility] done [facility].  He also had cervical laminoplasty is done for cervical spinal stenosis.  Described myelopathic problems before the surgery and has been walking better with improved strength since the procedure.  He has not noticed any recent decline.   Review of Systems reviewed updated and unchanged as it pertains HPI 14 point review of systems/2018 office visit.   Objective: Vital Signs: BP (!) 113/59   Pulse 87   Physical Exam  Constitutional: He is oriented to person, place, and time. He appears well-developed and well-nourished.  HENT:  Head: Normocephalic and atraumatic.    Eyes: EOM are normal. Pupils are equal, round, and reactive to light.  Neck: No tracheal deviation present. No thyromegaly present.  Cardiovascular: Normal rate.  Pulmonary/Chest: Effort normal. He has no wheezes.  Abdominal: Soft. Bowel sounds are normal.  Neurological: He is alert and oriented to person, place, and time.  Skin: Skin is warm and dry. Capillary refill takes less than 2 seconds.  Psychiatric: He has a normal mood and affect. His behavior is normal. Judgment and thought content normal.    Ortho Exam upper extremity reflexes are 3-4+ hyperreflexia.  Rotator Symmetrical.  Side of his right hand long finger.  Gait is still slightly myelopathic wide stance and some decreased balance.  Specialty Comments:  No specialty comments available.  Imaging: No results found.   PMFS History: Patient Active Problem List   Diagnosis Date Noted  . Protrusion of cervical intervertebral disc 07/20/2017  . Status post lumbar spinal fusion 07/20/2017   History reviewed. No pertinent past medical history.  History reviewed. No pertinent family history.  History reviewed. No pertinent surgical history. Social History   Occupational History  . Not on file  Tobacco Use  . Smoking status: Never Smoker  . Smokeless tobacco: Never Used  Substance and Sexual Activity  . Alcohol use: No  . Drug use: No  . Sexual activity: Not  on file

## 2017-08-03 ENCOUNTER — Encounter: Attending: Urology

## 2017-08-11 ENCOUNTER — Ambulatory Visit: Admit: 2017-08-11 | Discharge: 2017-08-11 | Attending: Urology

## 2017-08-11 DIAGNOSIS — N528 Other male erectile dysfunction: Secondary | ICD-10-CM

## 2017-08-11 MED ORDER — SILDENAFIL 20 MG TAB
20 mg | ORAL_TABLET | ORAL | 12 refills | Status: DC
Start: 2017-08-11 — End: 2018-02-15

## 2017-08-11 NOTE — Progress Notes (Signed)
HISTORY OF PRESENT ILLNESS:  Jonathan CritchleyGary D Durham is a 72 y.o. male who presents today for f/u c/o retrograde ejaculation w/ lower back pain. Told last visit to stop Flomax. On finasteride for BPH, s/p UroLift done for hx of UTI, prostatitis, urgency (resolved), and nocturia x1-2. Patient w/ occasional discomfort at tip of penis which is chronic. Pt w/ ED, given Sildenafil 60mg  last visit.     Pt states that stopping Flomax did not help retrograde ejaculation. Sildenafil somewhat effective for ED.    AUA Symptom Score 08/11/2017   Over the past month how often have you had the sensation that your bladder was not completely empty after you finished urinating? 1   Over the past month, how often have had to urinate again less than 2 hours after you last finished urinating? 1   Over the past month, how often have you found you stopped and started again several times when you urinated? 1   Over the past month, how often have you found it difficult to postpone urination? 0   Over the past month, how often have you had a weak urinary stream? 1   Over the past month, how often have you had to push or strain to begin urinating? 1   Over the past month, how many times did you most typically get up to urinate from the time you went to bed at night until the time you got up in the morning? 2   AUA Score 7   If you were to spend the rest of your life with your urinary condition the way it is now, how would you feel about that? Mostly satisfied       Past Medical History:   Diagnosis Date   ??? Kidney stones    ??? Thyroid disease    ??? Urinary tract disease        Past Surgical History:   Procedure Laterality Date   ??? HX BACK SURGERY     ??? HX LITHOTRIPSY     ??? HX TURP         Social History     Tobacco Use   ??? Smoking status: Former Smoker     Last attempt to quit: 08/01/1997     Years since quitting: 20.0   ??? Smokeless tobacco: Never Used   Substance Use Topics   ??? Alcohol use: No   ??? Drug use: No       Allergies    Allergen Reactions   ??? Hydromorphone Itching     Other reaction(s): Confusion   ??? Amitriptyline Other (comments)     Elavil makes him "hyper".   ??? Azithromycin Other (comments)     Causes ulcers  Other reaction(s): Other (See Comments)  Causes ulcers   ??? Erythromycin Nausea and Vomiting   ??? Erythromycin (Bulk) Other (comments)     Caused ulcer.   ??? Mirabegron Other (comments)     Headache  Other reaction(s): Other (See Comments)  Headache   ??? Prednisone Other (comments)     Numbness, pt states feet felt like they were on fire       Family History   Problem Relation Age of Onset   ??? No Known Problems Mother    ??? No Known Problems Father    ??? No Known Problems Sister    ??? No Known Problems Brother        Current Outpatient Medications   Medication Sig Dispense Refill   ??? sildenafil, antihypertensive, (  REVATIO) 20 mg tablet Take five tablets by mouth 30 minutes to one hour prior to sexual activity on an empty stomach. 30 Tab 12   ??? aspirin delayed-release 81 mg tablet Take  by mouth.     ??? levothyroxine (SYNTHROID) 137 mcg tablet Take  by mouth Daily (before breakfast).     ??? atorvastatin (LIPITOR) 20 mg tablet Take  by mouth daily.     ??? chlorhexidine (PERIDEX) 0.12 % solution RINSE 15 ML'S TWICE DAILY AFTER BREAKFAST/BEFORE BEDTIME FOLLOWING BRUSHING AND FLOSSING *04/06*  0   ??? finasteride (PROSCAR) 5 mg tablet Take 1 Tab by mouth daily. 30 Tab 12   ??? sertraline (ZOLOFT) 100 mg tablet Take  by mouth daily.     ??? hydroCHLOROthiazide (HYDRODIURIL) 25 mg tablet Take 25 mg by mouth daily.     ??? LORazepam (ATIVAN) 1 mg tablet Take  by mouth every four (4) hours as needed for Anxiety.     ??? cyanocobalamin (VITAMIN B12) 1,000 mcg/mL injection 1,000 mcg by IntraMUSCular route once.     ??? acetaminophen (TYLENOL) 325 mg tablet Take  by mouth every four (4) hours as needed for Pain.     ??? Cholecalciferol, Vitamin D3, 3,000 unit tab Take  by mouth.     ??? ibuprofen 200 mg cap Take  by mouth.         REVIEW OF SYSTEMS:   Constitutional: Fever: No  Skin: Rash: No  HEENT: Hearing difficulty: No  Eyes: Blurred vision: No  Cardiovascular: Chest pain: No  Respiratory: Shortness of breath: No  Gastrointestinal: Nausea/vomiting: No  Musculoskeletal: Back pain: No  Neurological: Weakness: No  Psychological: Memory loss: No  Comments/additional findings:       PHYSICAL EXAMINATION:   Visit Vitals  BP 144/83   Pulse 92   Temp 96.2 ??F (35.7 ??C)   Resp 16   Ht 6\' 4"  (1.93 m)   Wt 271 lb 6.4 oz (123.1 kg)   BMI 33.04 kg/m??     Constitutional: Well developed, no acute distress.   Eyes:  Conjunctiva normal.  Ears:  External ear normal.  Nose/Throat:  External nose normal.  CV:  Heart rate regular. No peripheral swelling noted.  Respiratory: No respiratory distress. No audible wheeze.  Skin: No rash. No ulcer.    Neuro/Psych:  Patient with appropriate affect.  Alert and oriented x 3.    Gait:  Normal.      REVIEW OF LABS AND IMAGING:    No labs or radiology this visit.       ASSESSMENT:     ICD-10-CM ICD-9-CM    1. Other male erectile dysfunction N52.8 607.84    2. 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 Benign prostatic hyperplasia with nocturia N40.1 600.01     R35.1 788.43    4. History of prostatitis Z87.438 V13.89         PLAN:    ?? Cont finasteride  ?? Increase Sildenafil to 100mg   ?? F/u 6 months to reassess    Patient's BMI is out of the normal parameters. Information about BMI was given and patient was advised to follow-up with their PCP for further management.    Chief Complaint   Patient presents with   ??? Benign Prostatic Hypertrophy     Medical documentation provided with the assistance of Isac Sarna, medical scribe for [employer], [place], [place].    Donis Kotowski [place], DO

## 2017-08-29 NOTE — Progress Notes (Signed)
faxed

## 2018-02-09 ENCOUNTER — Ambulatory Visit: Admit: 2018-02-09 | Discharge: 2018-02-09 | Attending: Urology

## 2018-02-09 ENCOUNTER — Ambulatory Visit: Attending: Urology

## 2018-02-09 DIAGNOSIS — N528 Other male erectile dysfunction: Secondary | ICD-10-CM

## 2018-02-09 NOTE — Progress Notes (Signed)
HISTORY OF PRESENT ILLNESS:  Jonathan Durham is a 72 y.o. male who presents today for 6 mo f/u BPH and ED. Had c/o retrograde ejaculation w/ lower back pain so told to stop Flomax w/ no change. On finasteride for BPH, s/p UroLift done for hx of UTI, prostatitis, urgency (resolved), and nocturia x1-2. Patient w/ occasional discomfort at tip of penis which is chronic. Pt w/ ED, increased Sildenafil to 100mg  last visit.     Pt reports that Sildenafil is not effective for ED. Pt reports that he went to a clinic and paid $2K for a stem cell injection. No urinary issues per paid.     AUA Symptom Score 02/09/2018   Over the past month how often have you had the sensation that your bladder was not completely empty after you finished urinating? 1   Over the past month, how often have had to urinate again less than 2 hours after you last finished urinating? 1   Over the past month, how often have you found you stopped and started again several times when you urinated? 1   Over the past month, how often have you found it difficult to postpone urination? 0   Over the past month, how often have you had a weak urinary stream? 1   Over the past month, how often have you had to push or strain to begin urinating? 1   Over the past month, how many times did you most typically get up to urinate from the time you went to bed at night until the time you got up in the morning? 2   AUA Score 7   If you were to spend the rest of your life with your urinary condition the way it is now, how would you feel about that? Mostly satisfied       Past Medical History:   Diagnosis Date   ??? Kidney stones    ??? Thyroid disease    ??? Urinary tract disease        Past Surgical History:   Procedure Laterality Date   ??? HX BACK SURGERY     ??? HX LITHOTRIPSY     ??? HX TURP         Social History     Tobacco Use   ??? Smoking status: Former Smoker     Last attempt to quit: 08/01/1997     Years since quitting: 20.5   ??? Smokeless tobacco: Never Used    Substance Use Topics   ??? Alcohol use: No   ??? Drug use: No       Allergies   Allergen Reactions   ??? Hydromorphone Itching     Other reaction(s): Confusion   ??? Amitriptyline Other (comments)     Elavil makes him "hyper".   ??? Azithromycin Other (comments)     Causes ulcers  Other reaction(s): Other (See Comments)  Causes ulcers   ??? Erythromycin Nausea and Vomiting   ??? Erythromycin (Bulk) Other (comments)     Caused ulcer.   ??? Mirabegron Other (comments)     Headache  Other reaction(s): Other (See Comments)  Headache   ??? Prednisone Other (comments)     Numbness, pt states feet felt like they were on fire       Family History   Problem Relation Age of Onset   ??? No Known Problems Mother    ??? No Known Problems Father    ??? No Known Problems Sister    ???  No Known Problems Brother        Current Outpatient Medications   Medication Sig Dispense Refill   ??? sildenafil, antihypertensive, (REVATIO) 20 mg tablet Take five tablets by mouth 30 minutes to one hour prior to sexual activity on an empty stomach. 30 Tab 12   ??? aspirin delayed-release 81 mg tablet Take  by mouth.     ??? levothyroxine (SYNTHROID) 137 mcg tablet Take  by mouth Daily (before breakfast).     ??? atorvastatin (LIPITOR) 20 mg tablet Take  by mouth daily.     ??? chlorhexidine (PERIDEX) 0.12 % solution RINSE 15 ML'S TWICE DAILY AFTER BREAKFAST/BEFORE BEDTIME FOLLOWING BRUSHING AND FLOSSING *04/06*  0   ??? finasteride (PROSCAR) 5 mg tablet Take 1 Tab by mouth daily. 30 Tab 12   ??? sertraline (ZOLOFT) 100 mg tablet Take  by mouth daily.     ??? hydroCHLOROthiazide (HYDRODIURIL) 25 mg tablet Take 25 mg by mouth daily.     ??? LORazepam (ATIVAN) 1 mg tablet Take  by mouth every four (4) hours as needed for Anxiety.     ??? cyanocobalamin (VITAMIN B12) 1,000 mcg/mL injection 1,000 mcg by IntraMUSCular route once.     ??? acetaminophen (TYLENOL) 325 mg tablet Take  by mouth every four (4) hours as needed for Pain.     ??? Cholecalciferol, Vitamin D3, 3,000 unit tab Take  by mouth.      ??? ibuprofen 200 mg cap Take  by mouth.         REVIEW OF SYSTEMS:  Constitutional: Fever: No  Skin: Rash: Yes  HEENT: Hearing difficulty: No  Eyes: Blurred vision: No  Cardiovascular: Chest pain: No  Respiratory: Shortness of breath: No  Gastrointestinal: Nausea/vomiting: No  Musculoskeletal: Back pain: Yes  Neurological: Weakness: No  Psychological: Memory loss: No  Comments/additional findings:       PHYSICAL EXAMINATION:   Visit Vitals  BP (!) 161/98   Pulse 78   Temp 96.7 ??F (35.9 ??C)   Resp 18   Ht 6\' 4"  (1.93 m)   Wt 269 lb (122 kg)   BMI 32.74 kg/m??     Constitutional: Well developed, no acute distress.   Eyes: Conjunctiva normal.  Ears: External ear normal.  Nose/Throat: External nose normal.  CV: Heart rate regular. No peripheral swelling noted.  Respiratory: No respiratory distress. No audible wheeze.  Skin: No rash. No ulcer.    Neuro/Psych: Patient with appropriate affect. Alert and oriented x 3.    Gait: Normal.      REVIEW OF LABS AND IMAGING:    No labs or radiology this visit.       ASSESSMENT:     ICD-10-CM ICD-9-CM    1. Other male erectile dysfunction N52.8 607.84    2. 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 Benign prostatic hyperplasia with nocturia N40.1 600.01     R35.1 788.43    4. History of prostatitis Z87.438 V13.89    5. Personal history of urinary (tract) infections Z87.440 V13.02         PLAN:    ?? Cont finasteride  ?? [facility]  ?? Will f/u after receives to teach injections    Patient's BMI is out of the normal parameters. Information about BMI was given and patient was advised to follow-up with their PCP for further management.    Chief Complaint   Patient presents with   ??? Benign Prostatic Hypertrophy   ??? Erectile Dysfunction  Medical documentation provided with the assistance of Lonell Grandchild, medical scribe for [employer], DO, [place].    Donise Woodle [place], DO

## 2018-02-09 NOTE — Progress Notes (Signed)
Progress Notes by Estelle JuneHughart, Zamirah Denny, DO at 02/09/18 1015                Author: Estelle JuneHughart, Lennis Rader, DO  Service: --  Author Type: Physician       Filed: 02/14/18 0927  Encounter Date: 02/09/2018  Status: Signed          Editor: Estelle JuneHughart, Danyale Ridinger, DO (Physician)                          HISTORY OF PRESENT ILLNESS:  Jonathan Durham  is a 72 y.o. male who presents  today for 6 mo f/u BPH and ED. Had c/o retrograde ejaculation w/ lower back pain so told to stop Flomax w/ no change. On finasteride for BPH, s/p UroLift done for hx of UTI, prostatitis, urgency (resolved), and nocturia x1-2. Patient w/ occasional discomfort  at tip of penis which is chronic. Pt w/ ED, increased Sildenafil to 100mg  last visit.       Pt reports that Sildenafil is not effective for ED. Pt reports that he went to a clinic and paid $2K for a stem cell injection. No urinary issues per paid.          AUA Symptom Score  02/09/2018        Over the past month how often have you had the sensation that your bladder was not completely empty after you finished urinating?  1     Over the past month, how often have had to urinate again less than 2 hours after you last finished urinating?  1     Over the past month, how often have you found you stopped and started again several times when you urinated?  1     Over the past month, how often have you found it difficult to postpone urination?  0     Over the past month, how often have you had a weak urinary stream?  1     Over the past month, how often have you had to push or strain to begin urinating?  1     Over the past month, how many times did you most typically get up to urinate from the time you went to bed at night until the time you got up in  the morning?  2     AUA Score  7        If you were to spend the rest of your life with your urinary condition the way it is now, how would you feel about that?  Mostly satisfied             Past Medical History:        Diagnosis  Date         ?  Kidney  stones       ?  Thyroid disease           ?  Urinary tract disease               Past Surgical History:         Procedure  Laterality  Date          ?  HX BACK SURGERY         ?  HX LITHOTRIPSY              ?  HX TURP                 Social History  Tobacco Use         ?  Smoking status:  Former Smoker              Last attempt to quit:  08/01/1997         Years since quitting:  20.5         ?  Smokeless tobacco:  Never Used       Substance Use Topics         ?  Alcohol use:  No         ?  Drug use:  No             Allergies        Allergen  Reactions         ?  Hydromorphone  Itching             Other reaction(s): Confusion         ?  Amitriptyline  Other (comments)             Elavil makes him "hyper".         ?  Azithromycin  Other (comments)             Causes ulcers   Other reaction(s): Other (See Comments)   Causes ulcers         ?  Erythromycin  Nausea and Vomiting     ?  Erythromycin (Bulk)  Other (comments)             Caused ulcer.         ?  Mirabegron  Other (comments)             Headache   Other reaction(s): Other (See Comments)   Headache         ?  Prednisone  Other (comments)             Numbness, pt states feet felt like they were on fire             Family History         Problem  Relation  Age of Onset          ?  No Known Problems  Mother       ?  No Known Problems  Father       ?  No Known Problems  Sister            ?  No Known Problems  Brother               Current Outpatient Medications          Medication  Sig  Dispense  Refill           ?  sildenafil, antihypertensive, (REVATIO) 20 mg tablet  Take five tablets by mouth 30 minutes to one hour prior to sexual activity on an empty stomach.  30 Tab  12     ?  aspirin delayed-release 81 mg tablet  Take  by mouth.         ?  levothyroxine (SYNTHROID) 137 mcg tablet  Take  by mouth Daily (before breakfast).         ?  atorvastatin (LIPITOR) 20 mg tablet  Take  by mouth daily.         ?  chlorhexidine (PERIDEX) 0.12 % solution  RINSE 15  ML'S TWICE DAILY AFTER BREAKFAST/BEFORE BEDTIME FOLLOWING BRUSHING AND FLOSSING *04/06*    0     ?  finasteride (PROSCAR)  5 mg tablet  Take 1 Tab by mouth daily.  30 Tab  12     ?  sertraline (ZOLOFT) 100 mg tablet  Take  by mouth daily.         ?  hydroCHLOROthiazide (HYDRODIURIL) 25 mg tablet  Take 25 mg by mouth daily.         ?  LORazepam (ATIVAN) 1 mg tablet  Take  by mouth every four (4) hours as needed for Anxiety.         ?  cyanocobalamin (VITAMIN B12) 1,000 mcg/mL injection  1,000 mcg by IntraMUSCular route once.         ?  acetaminophen (TYLENOL) 325 mg tablet  Take  by mouth every four (4) hours as needed for Pain.         ?  Cholecalciferol, Vitamin D3, 3,000 unit tab  Take  by mouth.               ?  ibuprofen 200 mg cap  Take  by mouth.               REVIEW OF SYSTEMS:   Constitutional: Fever: No   Skin: Rash: Yes   HEENT: Hearing difficulty: No   Eyes: Blurred vision: No   Cardiovascular: Chest pain: No   Respiratory: Shortness of breath: No   Gastrointestinal: Nausea/vomiting: No   Musculoskeletal: Back pain: Yes   Neurological: Weakness: No   Psychological: Memory loss: No   Comments/additional findings:          PHYSICAL EXAMINATION:    Visit Vitals      BP  (!) 161/98     Pulse  78     Temp  96.7 ??F (35.9 ??C)     Resp  18     Ht  6\' 4"  (1.93 m)     Wt  269 lb (122 kg)        BMI  32.74 kg/m??        Constitutional: Well developed, no acute distress.    Eyes: Conjunctiva normal.   Ears: External ear normal.   Nose/Throat: External nose normal.   CV: Heart rate regular.  No peripheral swelling noted.   Respiratory: No respiratory distress. No audible wheeze.   Skin: No rash. No ulcer.      Neuro/Psych: Patient with appropriate affect. Alert and oriented x 3.      Gait: Normal.         REVIEW OF LABS AND IMAGING:     No labs or radiology this visit.          ASSESSMENT:              ICD-10-CM  ICD-9-CM             1.  Other male erectile dysfunction  N52.8  607.84       2.  Retrograde ejaculation   N53.14  608.87       3.  Benign prostatic hyperplasia with nocturia  N40.1  600.01              R35.1  788.43             4.  History of prostatitis  Z87.438  V13.89             5.  Personal history of urinary (tract) infections  Z87.440  V13.02              PLAN:     ??  Cont finasteride   ??  Rx Alprostadil through [facility]   ??  Will f/u after receives to teach injections      Patient's BMI is out of the normal parameters. Information about BMI was given and patient was advised to follow-up with their PCP for further management.        Chief Complaint       Patient presents with        ?  Benign Prostatic Hypertrophy        ?  Erectile Dysfunction        Medical documentation provided with the assistance of Isac Sarna, medical  scribe for [facility], DO, [place].      Mitsuye Schrodt [place], DO

## 2018-02-15 ENCOUNTER — Ambulatory Visit: Admit: 2018-02-15 | Discharge: 2018-02-15 | Attending: Urology

## 2018-02-15 ENCOUNTER — Ambulatory Visit: Attending: Urology

## 2018-02-15 DIAGNOSIS — N528 Other male erectile dysfunction: Secondary | ICD-10-CM

## 2018-02-15 NOTE — Progress Notes (Signed)
HISTORY OF PRESENT ILLNESS:  Jonathan CritchleyGary D Durham is a 72 y.o. male who presents today to teach TriMix injections for ED not responsive to PO meds. On finasteride for BPH, s/p UroLift done for hx of UTI, prostatitis, urgency (resolved), and nocturia x1-2. Patient??w/??occasional discomfort at tip of penis which is chronic.     AUA Symptom Score 02/09/2018   Over the past month how often have you had the sensation that your bladder was not completely empty after you finished urinating? 1   Over the past month, how often have had to urinate again less than 2 hours after you last finished urinating? 1   Over the past month, how often have you found you stopped and started again several times when you urinated? 1   Over the past month, how often have you found it difficult to postpone urination? 0   Over the past month, how often have you had a weak urinary stream? 1   Over the past month, how often have you had to push or strain to begin urinating? 1   Over the past month, how many times did you most typically get up to urinate from the time you went to bed at night until the time you got up in the morning? 2   AUA Score 7   If you were to spend the rest of your life with your urinary condition the way it is now, how would you feel about that? Mostly satisfied       Past Medical History:   Diagnosis Date   ??? Kidney stones    ??? Thyroid disease    ??? Urinary tract disease        Past Surgical History:   Procedure Laterality Date   ??? HX BACK SURGERY     ??? HX LITHOTRIPSY     ??? HX TURP         Social History     Tobacco Use   ??? Smoking status: Former Smoker     Last attempt to quit: 08/01/1997     Years since quitting: 20.5   ??? Smokeless tobacco: Never Used   Substance Use Topics   ??? Alcohol use: No   ??? Drug use: No       Allergies   Allergen Reactions   ??? Hydromorphone Itching     Other reaction(s): Confusion   ??? Amitriptyline Other (comments)     Elavil makes him "hyper".   ??? Azithromycin Other (comments)     Causes ulcers   Other reaction(s): Other (See Comments)  Causes ulcers   ??? Erythromycin Nausea and Vomiting   ??? Erythromycin (Bulk) Other (comments)     Caused ulcer.   ??? Mirabegron Other (comments)     Headache  Other reaction(s): Other (See Comments)  Headache   ??? Prednisone Other (comments)     Numbness, pt states feet felt like they were on fire       Family History   Problem Relation Age of Onset   ??? No Known Problems Mother    ??? No Known Problems Father    ??? No Known Problems Sister    ??? No Known Problems Brother        Current Outpatient Medications   Medication Sig Dispense Refill   ??? aspirin delayed-release 81 mg tablet Take  by mouth.     ??? levothyroxine (SYNTHROID) 137 mcg tablet Take  by mouth Daily (before breakfast).     ??? atorvastatin (LIPITOR) 20  mg tablet Take  by mouth daily.     ??? chlorhexidine (PERIDEX) 0.12 % solution RINSE 15 ML'S TWICE DAILY AFTER BREAKFAST/BEFORE BEDTIME FOLLOWING BRUSHING AND FLOSSING *04/06*  0   ??? finasteride (PROSCAR) 5 mg tablet Take 1 Tab by mouth daily. 30 Tab 12   ??? sertraline (ZOLOFT) 100 mg tablet Take  by mouth daily.     ??? hydroCHLOROthiazide (HYDRODIURIL) 25 mg tablet Take 25 mg by mouth daily.     ??? LORazepam (ATIVAN) 1 mg tablet Take  by mouth every four (4) hours as needed for Anxiety.     ??? cyanocobalamin (VITAMIN B12) 1,000 mcg/mL injection 1,000 mcg by IntraMUSCular route once.     ??? acetaminophen (TYLENOL) 325 mg tablet Take  by mouth every four (4) hours as needed for Pain.     ??? Cholecalciferol, Vitamin D3, 3,000 unit tab Take  by mouth.     ??? ibuprofen 200 mg cap Take  by mouth.         REVIEW OF SYSTEMS:  Constitutional: Fever: No  Skin: Rash: No  HEENT: Hearing difficulty: No  Eyes: Blurred vision: No  Cardiovascular: Chest pain: No  Respiratory: Shortness of breath: No  Gastrointestinal: Nausea/vomiting: No  Musculoskeletal: Back pain: No  Neurological: Weakness: No  Psychological: Memory loss: No  Comments/additional findings:       PHYSICAL EXAMINATION:    Visit Vitals  BP (!) 162/105   Pulse 90   Temp 98.4 ??F (36.9 ??C)   Resp 18   Ht 6\' 4"  (1.93 m)   Wt 268 lb (121.6 kg)   BMI 32.62 kg/m??     Constitutional: Well developed, no acute distress.   Eyes: Conjunctiva normal.  Ears: External ear normal.  Nose/Throat: External nose normal.  CV: Heart rate regular. No peripheral swelling noted.  Respiratory: No respiratory distress. No audible wheeze.  Skin: No rash. No ulcer.    Neuro/Psych: Patient with appropriate affect.  Alert and oriented x 3.    Gait: Normal.      REVIEW OF LABS AND IMAGING:    No labs or radiology this visit.       ASSESSMENT:     ICD-10-CM ICD-9-CM    1. Other male erectile dysfunction N52.8 607.84 PR INJECT CORPORA CAVERN,PHARM AGNT   2. Benign prostatic hyperplasia with nocturia N40.1 600.01     R35.1 788.43    3. History of prostatitis Z87.438 V13.89         PLAN:    ?? Taught to do TriMix 0.33mL injections - pt supplied his own medication  ?? F/u 3 wks to reassess    Patient's BMI is out of the normal parameters.  Information about BMI was given and patient was advised to follow-up with their PCP for further management.    Chief Complaint   Patient presents with   ??? Erectile Dysfunction     Medical documentation provided with the assistance of Isac Sarna, medical scribe for [facility], [place], [place].    Imara Standiford [place], DO

## 2018-02-15 NOTE — Progress Notes (Signed)
Progress Notes by [name], DO at 02/15/18 1415                Author: [name], DO  Service: --  Author Type: Physician       Filed: 02/16/18 1002  Encounter Date: 02/15/2018  Status: Signed          Editor: [name], DO (Physician)                          HISTORY OF PRESENT ILLNESS:  Jonathan Durham  is a 72 y.o. male who presents  today to teach TriMix injections for ED not responsive to PO meds. On finasteride for BPH, s/p UroLift done for hx of UTI, prostatitis, urgency (resolved), and nocturia x1-2. Patient??w/??occasional discomfort at tip of penis which is chronic.          AUA Symptom Score  02/09/2018        Over the past month how often have you had the sensation that your bladder was not completely empty after you finished urinating?  1     Over the past month, how often have had to urinate again less than 2 hours after you last finished urinating?  1     Over the past month, how often have you found you stopped and started again several times when you urinated?  1     Over the past month, how often have you found it difficult to postpone urination?  0     Over the past month, how often have you had a weak urinary stream?  1     Over the past month, how often have you had to push or strain to begin urinating?  1     Over the past month, how many times did you most typically get up to urinate from the time you went to bed at night until the time you got up in  the morning?  2     AUA Score  7        If you were to spend the rest of your life with your urinary condition the way it is now, how would you feel about that?  Mostly satisfied             Past Medical History:        Diagnosis  Date         ?  Kidney stones       ?  Thyroid disease           ?  Urinary tract disease               Past Surgical History:         Procedure  Laterality  Date          ?  HX BACK SURGERY         ?  HX LITHOTRIPSY              ?  HX TURP                 Social History          Tobacco Use          ?  Smoking status:  Former Smoker              Last attempt to quit:  08/01/1997         Years since quitting:  20.5         ?  Smokeless tobacco:  Never Used       Substance Use Topics         ?  Alcohol use:  No         ?  Drug use:  No             Allergies        Allergen  Reactions         ?  Hydromorphone  Itching             Other reaction(s): Confusion         ?  Amitriptyline  Other (comments)             Elavil makes him "hyper".         ?  Azithromycin  Other (comments)             Causes ulcers   Other reaction(s): Other (See Comments)   Causes ulcers         ?  Erythromycin  Nausea and Vomiting     ?  Erythromycin (Bulk)  Other (comments)             Caused ulcer.         ?  Mirabegron  Other (comments)             Headache   Other reaction(s): Other (See Comments)   Headache         ?  Prednisone  Other (comments)             Numbness, pt states feet felt like they were on fire             Family History         Problem  Relation  Age of Onset          ?  No Known Problems  Mother       ?  No Known Problems  Father       ?  No Known Problems  Sister            ?  No Known Problems  Brother               Current Outpatient Medications          Medication  Sig  Dispense  Refill           ?  aspirin delayed-release 81 mg tablet  Take  by mouth.         ?  levothyroxine (SYNTHROID) 137 mcg tablet  Take  by mouth Daily (before breakfast).         ?  atorvastatin (LIPITOR) 20 mg tablet  Take  by mouth daily.         ?  chlorhexidine (PERIDEX) 0.12 % solution  RINSE 15 ML'S TWICE DAILY AFTER BREAKFAST/BEFORE BEDTIME FOLLOWING BRUSHING AND FLOSSING *04/06*    0     ?  finasteride (PROSCAR) 5 mg tablet  Take 1 Tab by mouth daily.  30 Tab  12     ?  sertraline (ZOLOFT) 100 mg tablet  Take  by mouth daily.         ?  hydroCHLOROthiazide (HYDRODIURIL) 25 mg tablet  Take 25 mg by mouth daily.         ?  LORazepam (ATIVAN) 1 mg tablet  Take  by mouth every four (4) hours as needed for Anxiety.               ?  cyanocobalamin (VITAMIN B12) 1,000 mcg/mL injection  1,000 mcg by IntraMUSCular route once.               ?  acetaminophen (TYLENOL) 325 mg tablet  Take  by mouth every four (4) hours as needed for Pain.         ?  Cholecalciferol, Vitamin D3, 3,000 unit tab  Take  by mouth.               ?  ibuprofen 200 mg cap  Take  by mouth.               REVIEW OF SYSTEMS:   Constitutional: Fever: No   Skin: Rash: No   HEENT: Hearing difficulty: No   Eyes: Blurred vision: No   Cardiovascular: Chest pain: No   Respiratory: Shortness of breath: No   Gastrointestinal: Nausea/vomiting: No   Musculoskeletal: Back pain: No   Neurological: Weakness: No   Psychological: Memory loss: No   Comments/additional findings:          PHYSICAL EXAMINATION:    Visit Vitals      BP  (!) 162/105     Pulse  90     Temp  98.4 ??F (36.9 ??C)     Resp  18     Ht  6\' 4"  (1.93 m)     Wt  268 lb (121.6 kg)        BMI  32.62 kg/m??        Constitutional: Well developed, no acute distress.    Eyes: Conjunctiva normal.   Ears: External ear normal.   Nose/Throat: External nose normal.   CV: Heart rate regular.  No peripheral swelling noted.   Respiratory: No respiratory distress. No audible wheeze.   Skin: No rash. No ulcer.      Neuro/Psych: Patient with appropriate affect.  Alert and oriented x 3.      Gait: Normal.         REVIEW OF LABS AND IMAGING:     No labs or radiology this visit.          ASSESSMENT:              ICD-10-CM  ICD-9-CM             1.  Other male erectile dysfunction  N52.8  607.84  PR INJECT CORPORA CAVERN,PHARM AGNT     2.  Benign prostatic hyperplasia with nocturia  N40.1  600.01              R35.1  788.43             3.  History of prostatitis  Z87.438  V13.89              PLAN:     ??  Taught to do TriMix 0.73mL injections - pt supplied his own medication   ??  F/u 3 wks to reassess      Patient's BMI is out of the normal parameters.  Information about BMI was given and patient was advised to follow-up with their PCP for further  management.        Chief Complaint       Patient presents with        ?  Erectile Dysfunction        Medical documentation provided with the assistance of Isac Sarna, medical  scribe for [facility], DO, [place].      Alichia Alridge [place], DO

## 2018-03-01 ENCOUNTER — Encounter: Attending: Urology

## 2018-03-09 ENCOUNTER — Ambulatory Visit: Admit: 2018-03-09 | Discharge: 2018-03-09 | Attending: Urology

## 2018-03-09 ENCOUNTER — Ambulatory Visit: Attending: Urology

## 2018-03-09 DIAGNOSIS — N528 Other male erectile dysfunction: Secondary | ICD-10-CM

## 2018-03-09 NOTE — Progress Notes (Signed)
HISTORY OF PRESENT ILLNESS:  Jonathan Durham is a 72 y.o. male who presents today for f/u ED. Taught last visit to do TriMix 0.6825mL injections for ED not responsive to PO meds. On finasteride for BPH, s/p UroLift done for hx of UTI, prostatitis, urgency (resolved), and nocturia x1-2. Patient??w/??occasional discomfort at tip of penis which is chronic.     Pt reports doing well. Has increased dosage to 0.5945mL and is working well. No urinary complaints.     AUA Symptom Score 03/09/2018   Over the past month how often have you had the sensation that your bladder was not completely empty after you finished urinating? 1   Over the past month, how often have had to urinate again less than 2 hours after you last finished urinating? 1   Over the past month, how often have you found you stopped and started again several times when you urinated? 1   Over the past month, how often have you found it difficult to postpone urination? 0   Over the past month, how often have you had a weak urinary stream? 1   Over the past month, how often have you had to push or strain to begin urinating? 1   Over the past month, how many times did you most typically get up to urinate from the time you went to bed at night until the time you got up in the morning? 2   AUA Score 7   If you were to spend the rest of your life with your urinary condition the way it is now, how would you feel about that? Mostly satisfied       Past Medical History:   Diagnosis Date   ??? Kidney stones    ??? Thyroid disease    ??? Urinary tract disease        Past Surgical History:   Procedure Laterality Date   ??? HX BACK SURGERY     ??? HX LITHOTRIPSY     ??? HX TURP         Social History     Tobacco Use   ??? Smoking status: Former Smoker     Last attempt to quit: 08/01/1997     Years since quitting: 20.6   ??? Smokeless tobacco: Never Used   Substance Use Topics   ??? Alcohol use: No   ??? Drug use: No       Allergies   Allergen Reactions   ??? Hydromorphone Itching      Other reaction(s): Confusion   ??? Amitriptyline Other (comments)     Elavil makes him "hyper".   ??? Azithromycin Other (comments)     Causes ulcers  Other reaction(s): Other (See Comments)  Causes ulcers   ??? Erythromycin Nausea and Vomiting   ??? Erythromycin (Bulk) Other (comments)     Caused ulcer.   ??? Mirabegron Other (comments)     Headache  Other reaction(s): Other (See Comments)  Headache   ??? Prednisone Other (comments)     Numbness, pt states feet felt like they were on fire       Family History   Problem Relation Age of Onset   ??? No Known Problems Mother    ??? No Known Problems Father    ??? No Known Problems Sister    ??? No Known Problems Brother        Current Outpatient Medications   Medication Sig Dispense Refill   ??? aspirin delayed-release 81 mg tablet Take  by mouth.     ??? levothyroxine (SYNTHROID) 137 mcg tablet Take  by mouth Daily (before breakfast).     ??? atorvastatin (LIPITOR) 20 mg tablet Take  by mouth daily.     ??? chlorhexidine (PERIDEX) 0.12 % solution RINSE 15 ML'S TWICE DAILY AFTER BREAKFAST/BEFORE BEDTIME FOLLOWING BRUSHING AND FLOSSING *04/06*  0   ??? finasteride (PROSCAR) 5 mg tablet Take 1 Tab by mouth daily. 30 Tab 12   ??? sertraline (ZOLOFT) 100 mg tablet Take  by mouth daily.     ??? hydroCHLOROthiazide (HYDRODIURIL) 25 mg tablet Take 25 mg by mouth daily.     ??? LORazepam (ATIVAN) 1 mg tablet Take  by mouth every four (4) hours as needed for Anxiety.     ??? cyanocobalamin (VITAMIN B12) 1,000 mcg/mL injection 1,000 mcg by IntraMUSCular route once.     ??? acetaminophen (TYLENOL) 325 mg tablet Take  by mouth every four (4) hours as needed for Pain.     ??? Cholecalciferol, Vitamin D3, 3,000 unit tab Take  by mouth.     ??? ibuprofen 200 mg cap Take  by mouth.         REVIEW OF SYSTEMS:  Constitutional: Fever: No  Skin: Rash: No  HEENT: Hearing difficulty: No  Eyes: Blurred vision: No  Cardiovascular: Chest pain: No  Respiratory: Shortness of breath: No  Gastrointestinal: Nausea/vomiting: No   Musculoskeletal: Back pain: Yes  Neurological: Weakness: No  Psychological: Memory loss: No  Comments/additional findings:       PHYSICAL EXAMINATION:   Visit Vitals  BP 167/85   Pulse 74   Temp 98.4 ??F (36.9 ??C)   Resp 18   Ht 6\' 4"  (1.93 m)   Wt 268 lb 9.6 oz (121.8 kg)   BMI 32.70 kg/m??     Constitutional: Well developed, no acute distress.   Eyes: Conjunctiva normal.  Ears: External ear normal.  Nose/Throat: External nose normal.  CV: Heart rate regular. No peripheral swelling noted.  Respiratory: No respiratory distress. No audible wheeze.  Skin: No rash. No ulcer.    Neuro/Psych: Patient with appropriate affect. Alert and oriented x 3.    Gait: Normal.      REVIEW OF LABS AND IMAGING:    No labs or radiology this visit.       ASSESSMENT:     ICD-10-CM ICD-9-CM    1. Other male erectile dysfunction N52.8 607.84    2. Benign prostatic hyperplasia with nocturia N40.1 600.01     R35.1 788.43    3. History of prostatitis Z87.438 V13.89         PLAN:    ?? Cont TriMix 0.63mL and finasteride  ?? F/u 6 months to reassess    Patient's BMI is out of the normal parameters.  Information about BMI was given and patient was advised to follow-up with their PCP for further management.    Chief Complaint   Patient presents with   ??? Erectile Dysfunction     Medical documentation provided with the assistance of Isac Sarna, medical scribe for [facility], [place], [place].    Kordell Jafri [place], DO

## 2018-03-09 NOTE — Progress Notes (Signed)
Progress Notes by Kaelyn Nauta, DO at 03/09/18 1015       [name]         Author: Estelle JuneHughart, Travonna Swindle, DO  Service: --  Author Type: Physician       Filed: 03/13/18 0919  Encounter Date: 03/09/2018  Status: Signed          Editor: Estelle JuneHughart, Lillias Difrancesco, DO (Physician)                          HISTORY OF PRESENT ILLNESS:  Jonathan Durham  is a 72 y.o. male who presents  today for f/u ED. Taught last visit to do TriMix 0.7525mL injections for ED not responsive to PO meds. On finasteride for BPH, s/p UroLift done for hx of UTI, prostatitis, urgency (resolved), and nocturia x1-2. Patient??w/??occasional discomfort at  tip of penis which is chronic.       Pt reports doing well. Has increased dosage to 0.4545mL and is working well. No urinary complaints.          AUA Symptom Score  03/09/2018        Over the past month how often have you had the sensation that your bladder was not completely empty after you finished urinating?  1     Over the past month, how often have had to urinate again less than 2 hours after you last finished urinating?  1     Over the past month, how often have you found you stopped and started again several times when you urinated?  1     Over the past month, how often have you found it difficult to postpone urination?  0     Over the past month, how often have you had a weak urinary stream?  1     Over the past month, how often have you had to push or strain to begin urinating?  1     Over the past month, how many times did you most typically get up to urinate from the time you went to bed at night until the time you got up in  the morning?  2     AUA Score  7        If you were to spend the rest of your life with your urinary condition the way it is now, how would you feel about that?  Mostly satisfied             Past Medical History:        Diagnosis  Date         ?  Kidney stones       ?  Thyroid disease           ?  Urinary tract disease               Past Surgical History:         Procedure  Laterality   Date          ?  HX BACK SURGERY         ?  HX LITHOTRIPSY              ?  HX TURP                 Social History          Tobacco Use         ?  Smoking status:  Former Smoker  Last attempt to quit:  08/01/1997         Years since quitting:  20.6         ?  Smokeless tobacco:  Never Used       Substance Use Topics         ?  Alcohol use:  No         ?  Drug use:  No             Allergies        Allergen  Reactions         ?  Hydromorphone  Itching             Other reaction(s): Confusion         ?  Amitriptyline  Other (comments)             Elavil makes him "hyper".         ?  Azithromycin  Other (comments)             Causes ulcers   Other reaction(s): Other (See Comments)   Causes ulcers         ?  Erythromycin  Nausea and Vomiting     ?  Erythromycin (Bulk)  Other (comments)             Caused ulcer.         ?  Mirabegron  Other (comments)             Headache   Other reaction(s): Other (See Comments)   Headache         ?  Prednisone  Other (comments)             Numbness, pt states feet felt like they were on fire             Family History         Problem  Relation  Age of Onset          ?  No Known Problems  Mother       ?  No Known Problems  Father       ?  No Known Problems  Sister            ?  No Known Problems  Brother               Current Outpatient Medications          Medication  Sig  Dispense  Refill           ?  aspirin delayed-release 81 mg tablet  Take  by mouth.         ?  levothyroxine (SYNTHROID) 137 mcg tablet  Take  by mouth Daily (before breakfast).         ?  atorvastatin (LIPITOR) 20 mg tablet  Take  by mouth daily.         ?  chlorhexidine (PERIDEX) 0.12 % solution  RINSE 15 ML'S TWICE DAILY AFTER BREAKFAST/BEFORE BEDTIME FOLLOWING BRUSHING AND FLOSSING *04/06*    0     ?  finasteride (PROSCAR) 5 mg tablet  Take 1 Tab by mouth daily.  30 Tab  12     ?  sertraline (ZOLOFT) 100 mg tablet  Take  by mouth daily.         ?  hydroCHLOROthiazide (HYDRODIURIL) 25 mg tablet  Take 25  mg by mouth daily.         ?  LORazepam (ATIVAN)  1 mg tablet  Take  by mouth every four (4) hours as needed for Anxiety.         ?  cyanocobalamin (VITAMIN B12) 1,000 mcg/mL injection  1,000 mcg by IntraMUSCular route once.         ?  acetaminophen (TYLENOL) 325 mg tablet  Take  by mouth every four (4) hours as needed for Pain.         ?  Cholecalciferol, Vitamin D3, 3,000 unit tab  Take  by mouth.               ?  ibuprofen 200 mg cap  Take  by mouth.               REVIEW OF SYSTEMS:   Constitutional: Fever: No   Skin: Rash: No   HEENT: Hearing difficulty: No   Eyes: Blurred vision: No   Cardiovascular: Chest pain: No   Respiratory: Shortness of breath: No   Gastrointestinal: Nausea/vomiting: No   Musculoskeletal: Back pain: Yes   Neurological: Weakness: No   Psychological: Memory loss: No   Comments/additional findings:          PHYSICAL EXAMINATION:    Visit Vitals      BP  167/85     Pulse  74     Temp  98.4 ??F (36.9 ??C)     Resp  18     Ht  6\' 4"  (1.93 m)     Wt  268 lb 9.6 oz (121.8 kg)        BMI  32.70 kg/m??        Constitutional: Well developed, no acute distress.    Eyes: Conjunctiva normal.   Ears: External ear normal.   Nose/Throat: External nose normal.   CV: Heart rate regular.  No peripheral swelling noted.   Respiratory: No respiratory distress. No audible wheeze.   Skin: No rash. No ulcer.      Neuro/Psych: Patient with appropriate affect. Alert and oriented x 3.      Gait: Normal.         REVIEW OF LABS AND IMAGING:     No labs or radiology this visit.          ASSESSMENT:              ICD-10-CM  ICD-9-CM             1.  Other male erectile dysfunction  N52.8  607.84       2.  Benign prostatic hyperplasia with nocturia  N40.1  600.01              R35.1  788.43             3.  History of prostatitis  Z87.438  V13.89              PLAN:     ??  Cont TriMix 0.77mL and finasteride   ??  F/u 6 months to reassess      Patient's BMI is out of the normal parameters.  Information about BMI was given and  patient was advised to follow-up with their PCP for further management.        Chief Complaint       Patient presents with        ?  Erectile Dysfunction        Medical documentation provided with the assistance of Isac Sarna, medical  scribe for [facility], DO, [address].  Takeya Marquis [place], DO

## 2018-09-12 ENCOUNTER — Encounter: Attending: Urology

## 2018-09-12 ENCOUNTER — Ambulatory Visit: Admit: 2018-09-12 | Discharge: 2018-09-12 | Payer: MEDICARE | Attending: Urology

## 2018-09-12 ENCOUNTER — Ambulatory Visit: Attending: Urology

## 2018-09-12 DIAGNOSIS — N529 Male erectile dysfunction, unspecified: Secondary | ICD-10-CM

## 2018-09-12 NOTE — Progress Notes (Signed)
HISTORY OF PRESENT ILLNESS:  Jonathan Durham is a 73 y.o. male who presents today for 6 month f/u of BPH and ED. Taught 02/15/2018 to do TriMix 0.10mL injections for ED not responsive to PO meds. At his last visit he reported increasing his dose to 0.44mL with benefit. On finasteride for BPH, s/p UroLift done for hx of UTI, prostatitis, urgency (resolved), and nocturia x1-2. Patient??w/??occasional discomfort at tip of penis which is chronic.     09/12/18-  Patient notes mild dysuria but much less than usual.  No GH.  Patient has not tried increased trimix.  Nocturia 1-2.      AUA Symptom Score 09/12/2018   Over the past month how often have you had the sensation that your bladder was not completely empty after you finished urinating? 1   Over the past month, how often have had to urinate again less than 2 hours after you last finished urinating? 1   Over the past month, how often have you found you stopped and started again several times when you urinated? 1   Over the past month, how often have you found it difficult to postpone urination? 0   Over the past month, how often have you had a weak urinary stream? 1   Over the past month, how often have you had to push or strain to begin urinating? 1   Over the past month, how many times did you most typically get up to urinate from the time you went to bed at night until the time you got up in the morning? 2   AUA Score 7   If you were to spend the rest of your life with your urinary condition the way it is now, how would you feel about that? Mostly satisfied       Past Medical History:   Diagnosis Date   ??? Kidney stones    ??? Thyroid disease    ??? Urinary tract disease        Past Surgical History:   Procedure Laterality Date   ??? HX BACK SURGERY     ??? HX LITHOTRIPSY     ??? HX TURP         Social History     Tobacco Use   ??? Smoking status: Former Smoker     Last attempt to quit: 08/01/1997     Years since quitting: 21.1   ??? Smokeless tobacco: Never Used    Substance Use Topics   ??? Alcohol use: No   ??? Drug use: No       Allergies   Allergen Reactions   ??? Hydromorphone Itching     Other reaction(s): Confusion   ??? Amitriptyline Other (comments)     Elavil makes him "hyper".   ??? Azithromycin Other (comments)     Causes ulcers  Other reaction(s): Other (See Comments)  Causes ulcers   ??? Erythromycin Nausea and Vomiting   ??? Erythromycin (Bulk) Other (comments)     Caused ulcer.   ??? Mirabegron Other (comments)     Headache  Other reaction(s): Other (See Comments)  Headache   ??? Prednisone Other (comments)     Numbness, pt states feet felt like they were on fire       Family History   Problem Relation Age of Onset   ??? No Known Problems Mother    ??? No Known Problems Father    ??? No Known Problems Sister    ??? No Known Problems Brother  Current Outpatient Medications   Medication Sig Dispense Refill   ??? aspirin delayed-release 81 mg tablet Take  by mouth.     ??? levothyroxine (SYNTHROID) 137 mcg tablet Take  by mouth Daily (before breakfast).     ??? atorvastatin (LIPITOR) 20 mg tablet Take  by mouth daily.     ??? chlorhexidine (PERIDEX) 0.12 % solution RINSE 15 ML'S TWICE DAILY AFTER BREAKFAST/BEFORE BEDTIME FOLLOWING BRUSHING AND FLOSSING *04/06*  0   ??? finasteride (PROSCAR) 5 mg tablet Take 1 Tab by mouth daily. 30 Tab 12   ??? sertraline (ZOLOFT) 100 mg tablet Take  by mouth daily.     ??? hydroCHLOROthiazide (HYDRODIURIL) 25 mg tablet Take 25 mg by mouth daily.     ??? LORazepam (ATIVAN) 1 mg tablet Take  by mouth every four (4) hours as needed for Anxiety.     ??? cyanocobalamin (VITAMIN B12) 1,000 mcg/mL injection 1,000 mcg by IntraMUSCular route once.     ??? acetaminophen (TYLENOL) 325 mg tablet Take  by mouth every four (4) hours as needed for Pain.     ??? Cholecalciferol, Vitamin D3, 3,000 unit tab Take  by mouth.     ??? ibuprofen 200 mg cap Take  by mouth.         REVIEW OF SYSTEMS:  Constitutional: Fever: No  Skin: Rash: No  HEENT: Hearing difficulty: No   Eyes: Blurred vision: No  Cardiovascular: Chest pain: No  Respiratory: Shortness of breath: No  Gastrointestinal: Nausea/vomiting: No  Musculoskeletal: Back pain: Yes  Neurological: Weakness: No  Psychological: Memory loss: No  Comments/additional findings:       PHYSICAL EXAMINATION:   Visit Vitals  BP (!) 167/96   Pulse 95   Temp 97.6 ??F (36.4 ??C)   Resp 18   Ht 6\' 4"  (1.93 m)   Wt 282 lb (127.9 kg)   BMI 34.33 kg/m??     Constitutional: Well developed, no acute distress.   Eyes: Conjunctiva normal.  Ears: External ear normal.  Nose/Throat: External nose normal.  CV: Heart rate regular. No peripheral swelling noted.  Respiratory: No respiratory distress. No audible wheeze.  Skin: No rash. No ulcer.    Neuro/Psych: Patient with appropriate affect. Alert and oriented x 3.    Gait: Normal.      REVIEW OF LABS AND IMAGING:    No labs or radiology this visit.       ASSESSMENT:     ICD-10-CM ICD-9-CM    1. Erectile dysfunction of organic origin N52.9 607.84    2. BPH with obstruction/lower urinary tract symptoms N40.1 600.01     N13.8 599.69         PLAN:    ?? Cont TriMix 0.63mL and finasteride  ?? F/u 6 months with exam    Patient's BMI is out of the normal parameters.  Information about BMI was given and patient was advised to follow-up with their PCP for further management.    Chief Complaint   Patient presents with   ??? Benign Prostatic Hypertrophy     Medical documentation provided with the assistance of Isac Sarna, medical scribe for [employer], [place], [place].    Isma Tietje [place], DO

## 2018-09-12 NOTE — Progress Notes (Signed)
Progress Notes by [name], DO at 09/12/18 0945                Author: [name], DO  Service: --  Author Type: Physician       Filed: 09/14/18 1000  Encounter Date: 09/12/2018  Status: Signed          Editor: [name], DO (Physician)                          HISTORY OF PRESENT ILLNESS:  Jonathan Durham  is a 73 y.o. male who presents  today for 6 month f/u of BPH and ED. Taught 02/15/2018 to do TriMix 0.39mL injections for ED not responsive to PO meds. At his last visit he reported increasing his dose to 0.50mL with benefit. On finasteride for BPH, s/p UroLift done for hx of UTI, prostatitis,  urgency (resolved), and nocturia x1-2. Patient??w/??occasional discomfort at tip of penis which is chronic.       09/12/18-  Patient notes mild dysuria but much less than usual.  No GH.  Patient has not tried increased trimix.  Nocturia 1-2.           AUA Symptom Score  09/12/2018        Over the past month how often have you had the sensation that your bladder was not completely empty after you finished urinating?  1     Over the past month, how often have had to urinate again less than 2 hours after you last finished urinating?  1     Over the past month, how often have you found you stopped and started again several times when you urinated?  1     Over the past month, how often have you found it difficult to postpone urination?  0     Over the past month, how often have you had a weak urinary stream?  1     Over the past month, how often have you had to push or strain to begin urinating?  1     Over the past month, how many times did you most typically get up to urinate from the time you went to bed at night until the time you got up in  the morning?  2     AUA Score  7        If you were to spend the rest of your life with your urinary condition the way it is now, how would you feel about that?  Mostly satisfied             Past Medical History:        Diagnosis  Date         ?  Kidney stones        ?  Thyroid disease           ?  Urinary tract disease               Past Surgical History:         Procedure  Laterality  Date          ?  HX BACK SURGERY         ?  HX LITHOTRIPSY              ?  HX TURP                 Social History  Tobacco Use         ?  Smoking status:  Former Smoker              Last attempt to quit:  08/01/1997         Years since quitting:  21.1         ?  Smokeless tobacco:  Never Used       Substance Use Topics         ?  Alcohol use:  No         ?  Drug use:  No             Allergies        Allergen  Reactions         ?  Hydromorphone  Itching             Other reaction(s): Confusion         ?  Amitriptyline  Other (comments)             Elavil makes him "hyper".         ?  Azithromycin  Other (comments)             Causes ulcers   Other reaction(s): Other (See Comments)   Causes ulcers         ?  Erythromycin  Nausea and Vomiting     ?  Erythromycin (Bulk)  Other (comments)             Caused ulcer.         ?  Mirabegron  Other (comments)             Headache   Other reaction(s): Other (See Comments)   Headache         ?  Prednisone  Other (comments)             Numbness, pt states feet felt like they were on fire             Family History         Problem  Relation  Age of Onset          ?  No Known Problems  Mother       ?  No Known Problems  Father       ?  No Known Problems  Sister            ?  No Known Problems  Brother               Current Outpatient Medications          Medication  Sig  Dispense  Refill           ?  aspirin delayed-release 81 mg tablet  Take  by mouth.         ?  levothyroxine (SYNTHROID) 137 mcg tablet  Take  by mouth Daily (before breakfast).         ?  atorvastatin (LIPITOR) 20 mg tablet  Take  by mouth daily.         ?  chlorhexidine (PERIDEX) 0.12 % solution  RINSE 15 ML'S TWICE DAILY AFTER BREAKFAST/BEFORE BEDTIME FOLLOWING BRUSHING AND FLOSSING *04/06*    0     ?  finasteride (PROSCAR) 5 mg tablet  Take 1 Tab by mouth daily.  30 Tab  12     ?   sertraline (ZOLOFT) 100 mg tablet  Take  by mouth daily.         ?  hydroCHLOROthiazide (HYDRODIURIL) 25 mg tablet  Take 25 mg by mouth daily.         ?  LORazepam (ATIVAN) 1 mg tablet  Take  by mouth every four (4) hours as needed for Anxiety.         ?  cyanocobalamin (VITAMIN B12) 1,000 mcg/mL injection  1,000 mcg by IntraMUSCular route once.         ?  acetaminophen (TYLENOL) 325 mg tablet  Take  by mouth every four (4) hours as needed for Pain.         ?  Cholecalciferol, Vitamin D3, 3,000 unit tab  Take  by mouth.               ?  ibuprofen 200 mg cap  Take  by mouth.               REVIEW OF SYSTEMS:   Constitutional: Fever: No   Skin: Rash: No   HEENT: Hearing difficulty: No   Eyes: Blurred vision: No   Cardiovascular: Chest pain: No   Respiratory: Shortness of breath: No   Gastrointestinal: Nausea/vomiting: No   Musculoskeletal: Back pain: Yes   Neurological: Weakness: No   Psychological: Memory loss: No   Comments/additional findings:          PHYSICAL EXAMINATION:    Visit Vitals      BP  (!) 167/96     Pulse  95     Temp  97.6 ??F (36.4 ??C)     Resp  18     Ht  6\' 4"  (1.93 m)     Wt  282 lb (127.9 kg)        BMI  34.33 kg/m??        Constitutional: Well developed, no acute distress.    Eyes: Conjunctiva normal.   Ears: External ear normal.   Nose/Throat: External nose normal.   CV: Heart rate regular.  No peripheral swelling noted.   Respiratory: No respiratory distress. No audible wheeze.   Skin: No rash. No ulcer.      Neuro/Psych: Patient with appropriate affect. Alert and oriented x 3.      Gait: Normal.         REVIEW OF LABS AND IMAGING:     No labs or radiology this visit.          ASSESSMENT:              ICD-10-CM  ICD-9-CM             1.  Erectile dysfunction of organic origin  N52.9  607.84       2.  BPH with obstruction/lower urinary tract symptoms  N40.1  600.01              N13.8  599.69              PLAN:     ??  Cont TriMix 0.13mL and finasteride   ??  F/u 6 months with exam      Patient's BMI  is out of the normal parameters.  Information about BMI was given and patient was advised to follow-up with their PCP for further management.        Chief Complaint       Patient presents with        ?  Benign Prostatic Hypertrophy        Medical documentation provided with the assistance of Isac Sarna, medical  scribe for [employer],  DO, FACOS.      Tulsi Crossett [place]iders[place]Hughart, DO

## 2019-03-13 ENCOUNTER — Ambulatory Visit: Admit: 2019-03-13 | Discharge: 2019-03-13 | Payer: MEDICARE | Attending: Urology

## 2019-03-13 ENCOUNTER — Ambulatory Visit: Attending: Urology

## 2019-03-13 DIAGNOSIS — N138 Other obstructive and reflux uropathy: Secondary | ICD-10-CM

## 2019-03-13 DIAGNOSIS — N401 Enlarged prostate with lower urinary tract symptoms: Secondary | ICD-10-CM

## 2019-03-13 NOTE — Progress Notes (Signed)
HISTORY OF PRESENT ILLNESS:  Jonathan Durham is a 73 y.o. male who presents today for 6 month f/u of BPH and ED. Taught 02/15/2018 to do TriMix 0.67mL injections for ED not responsive to PO meds. At his last visit he reported increasing his dose to 0.40mL with benefit. On finasteride for BPH, s/p UroLift done for hx of UTI, prostatitis, urgency (resolved), and nocturia x1-2. Patient??w/??occasional discomfort at tip of penis which is chronic.     09/12/18-  Patient notes mild dysuria but much less than usual.  No GH.  Patient has not tried increased trimix.  Nocturia 1-2.      03/13/19-patient reports no dysuria.  Trimix is still working for erectile dysfunction.  Patient reports no changes otherwise in other above symptoms since last visit when reviewed.      AUA Symptom Score 03/13/2019   Over the past month how often have you had the sensation that your bladder was not completely empty after you finished urinating? 1   Over the past month, how often have had to urinate again less than 2 hours after you last finished urinating? 1   Over the past month, how often have you found you stopped and started again several times when you urinated? 2   Over the past month, how often have you found it difficult to postpone urination? 0   Over the past month, how often have you had a weak urinary stream? 2   Over the past month, how often have you had to push or strain to begin urinating? 1   Over the past month, how many times did you most typically get up to urinate from the time you went to bed at night until the time you got up in the morning? 2   AUA Score 9   If you were to spend the rest of your life with your urinary condition the way it is now, how would you feel about that? Mostly satisfied       Past Medical History:   Diagnosis Date   ??? Kidney stones    ??? Thyroid disease    ??? Urinary tract disease        Past Surgical History:   Procedure Laterality Date   ??? HX BACK SURGERY     ??? HX LITHOTRIPSY     ??? HX TURP          Social History     Tobacco Use   ??? Smoking status: Former Smoker     Last attempt to quit: 08/01/1997     Years since quitting: 21.6   ??? Smokeless tobacco: Never Used   Substance Use Topics   ??? Alcohol use: No   ??? Drug use: No       Allergies   Allergen Reactions   ??? Hydromorphone Itching     Other reaction(s): Confusion   ??? Amitriptyline Other (comments)     Elavil makes him "hyper".   ??? Azithromycin Other (comments)     Causes ulcers  Other reaction(s): Other (See Comments)  Causes ulcers   ??? Erythromycin Nausea and Vomiting   ??? Erythromycin (Bulk) Other (comments)     Caused ulcer.   ??? Mirabegron Other (comments)     Headache  Other reaction(s): Other (See Comments)  Headache   ??? Prednisone Other (comments)     Numbness, pt states feet felt like they were on fire       Family History   Problem Relation Age of  Onset   ??? No Known Problems Mother    ??? No Known Problems Father    ??? No Known Problems Sister    ??? No Known Problems Brother        Current Outpatient Medications   Medication Sig Dispense Refill   ??? gabapentin (NEURONTIN) 300 mg capsule      ??? aspirin delayed-release 81 mg tablet Take  by mouth.     ??? levothyroxine (SYNTHROID) 137 mcg tablet Take  by mouth Daily (before breakfast).     ??? atorvastatin (LIPITOR) 20 mg tablet Take  by mouth daily.     ??? chlorhexidine (PERIDEX) 0.12 % solution RINSE 15 ML'S TWICE DAILY AFTER BREAKFAST/BEFORE BEDTIME FOLLOWING BRUSHING AND FLOSSING *04/06*  0   ??? finasteride (PROSCAR) 5 mg tablet Take 1 Tab by mouth daily. 30 Tab 12   ??? sertraline (ZOLOFT) 100 mg tablet Take  by mouth daily.     ??? hydroCHLOROthiazide (HYDRODIURIL) 25 mg tablet Take 25 mg by mouth daily.     ??? LORazepam (ATIVAN) 1 mg tablet Take  by mouth every four (4) hours as needed for Anxiety.     ??? cyanocobalamin (VITAMIN B12) 1,000 mcg/mL injection 1,000 mcg by IntraMUSCular route once.     ??? acetaminophen (TYLENOL) 325 mg tablet Take  by mouth every four (4) hours as needed for Pain.      ??? Cholecalciferol, Vitamin D3, 3,000 unit tab Take  by mouth.     ??? ibuprofen 200 mg cap Take  by mouth.         REVIEW OF SYSTEMS:  Constitutional: Fever: No  Skin: Rash: No  HEENT: Hearing difficulty: No  Eyes: Blurred vision: No  Cardiovascular: Chest pain: No  Respiratory: Shortness of breath: No  Gastrointestinal: Nausea/vomiting: No  Musculoskeletal: Back pain: No  Neurological: Weakness: No  Psychological: Memory loss: No  Comments/additional findings:       PHYSICAL EXAMINATION:   Visit Vitals  Temp 97.1 ??F (36.2 ??C) (Temporal)   Ht 6\' 1"  (1.854 m)   Wt 285 lb (129.3 kg)   BMI 37.60 kg/m??     Constitutional: Well developed, no acute distress.   Eyes: Conjunctiva normal.  Ears: External ear normal.  Nose/Throat: External nose normal.  CV: Heart rate regular. No peripheral swelling noted.  Respiratory: No respiratory distress. No audible wheeze.  Skin: No rash. No ulcer.    Neuro/Psych: Patient with appropriate affect. Alert and oriented x 3.    Gait: Normal.  GU: Prostate severely enlarged.  Testes within normal limits.    REVIEW OF LABS AND IMAGING:    No labs or radiology this visit.       ASSESSMENT:     ICD-10-CM ICD-9-CM    1. BPH with obstruction/lower urinary tract symptoms  N40.1 600.01     N13.8 599.69    2. Erectile dysfunction of organic origin  N52.9 607.84    3. Personal history of urinary (tract) infections  Z87.440 V13.02         PLAN:    ?? Cont TriMix 0.5645mL and finasteride  ?? Follow-up in 1 year    Patient's BMI is out of the normal parameters.  Information about BMI was given and patient was advised to follow-up with their PCP for further management.    Chief Complaint   Patient presents with   ??? Follow-up     6 mo f/u BPH & ED   ??? Benign Prostatic Hypertrophy  Jonathan Marquis Eldon, DO

## 2019-03-13 NOTE — Progress Notes (Signed)
Progress Notes by Estelle JuneHughart, Hanan Moen, DO at 03/13/19 0900                Author: Estelle JuneHughart, Analea Muller, DO  Service: --  Author Type: Physician       Filed: 04/04/19 1743  Encounter Date: 03/13/2019  Status: Signed          Editor: Estelle JuneHughart, Tima Curet, DO (Physician)                          HISTORY OF PRESENT ILLNESS:  Jonathan Durham  is a 73 y.o. male who presents  today for 6 month f/u of BPH and ED. Taught 02/15/2018 to do TriMix 0.125mL injections for ED not responsive to PO meds. At his last visit he reported increasing his dose to 0.2145mL with benefit. On finasteride for BPH, s/p UroLift done for hx of UTI, prostatitis,  urgency (resolved), and nocturia x1-2. Patient??w/??occasional discomfort at tip of penis which is chronic.       09/12/18-  Patient notes mild dysuria but much less than usual.  No GH.  Patient has not tried increased trimix.  Nocturia 1-2.        03/13/19-patient reports no dysuria.  Trimix is still working for erectile dysfunction.  Patient reports no changes otherwise in other above symptoms since last visit when reviewed.            AUA Symptom Score  03/13/2019        Over the past month how often have you had the sensation that your bladder was not completely empty after you finished urinating?  1     Over the past month, how often have had to urinate again less than 2 hours after you last finished urinating?  1     Over the past month, how often have you found you stopped and started again several times when you urinated?  2     Over the past month, how often have you found it difficult to postpone urination?  0     Over the past month, how often have you had a weak urinary stream?  2     Over the past month, how often have you had to push or strain to begin urinating?  1     Over the past month, how many times did you most typically get up to urinate from the time you went to bed at night until the time you got up in  the morning?  2     AUA Score  9        If you were to spend the rest of  your life with your urinary condition the way it is now, how would you feel about that?  Mostly satisfied             Past Medical History:        Diagnosis  Date         ?  Kidney stones       ?  Thyroid disease           ?  Urinary tract disease               Past Surgical History:         Procedure  Laterality  Date          ?  HX BACK SURGERY         ?  HX LITHOTRIPSY              ?  HX TURP                 Social History          Tobacco Use         ?  Smoking status:  Former Smoker              Last attempt to quit:  08/01/1997         Years since quitting:  21.6         ?  Smokeless tobacco:  Never Used       Substance Use Topics         ?  Alcohol use:  No         ?  Drug use:  No             Allergies        Allergen  Reactions         ?  Hydromorphone  Itching             Other reaction(s): Confusion         ?  Amitriptyline  Other (comments)             Elavil makes him "hyper".         ?  Azithromycin  Other (comments)             Causes ulcers   Other reaction(s): Other (See Comments)   Causes ulcers         ?  Erythromycin  Nausea and Vomiting     ?  Erythromycin (Bulk)  Other (comments)             Caused ulcer.         ?  Mirabegron  Other (comments)             Headache   Other reaction(s): Other (See Comments)   Headache         ?  Prednisone  Other (comments)             Numbness, pt states feet felt like they were on fire             Family History         Problem  Relation  Age of Onset          ?  No Known Problems  Mother       ?  No Known Problems  Father       ?  No Known Problems  Sister            ?  No Known Problems  Brother               Current Outpatient Medications          Medication  Sig  Dispense  Refill           ?  gabapentin (NEURONTIN) 300 mg capsule           ?  aspirin delayed-release 81 mg tablet  Take  by mouth.         ?  levothyroxine (SYNTHROID) 137 mcg tablet  Take  by mouth Daily (before breakfast).         ?  atorvastatin (LIPITOR) 20 mg tablet  Take  by mouth daily.                ?  chlorhexidine (PERIDEX) 0.12 % solution  RINSE 15 ML'S TWICE DAILY AFTER BREAKFAST/BEFORE BEDTIME FOLLOWING  BRUSHING AND FLOSSING *04/06*    0           ?  finasteride (PROSCAR) 5 mg tablet  Take 1 Tab by mouth daily.  30 Tab  12     ?  sertraline (ZOLOFT) 100 mg tablet  Take  by mouth daily.         ?  hydroCHLOROthiazide (HYDRODIURIL) 25 mg tablet  Take 25 mg by mouth daily.         ?  LORazepam (ATIVAN) 1 mg tablet  Take  by mouth every four (4) hours as needed for Anxiety.         ?  cyanocobalamin (VITAMIN B12) 1,000 mcg/mL injection  1,000 mcg by IntraMUSCular route once.         ?  acetaminophen (TYLENOL) 325 mg tablet  Take  by mouth every four (4) hours as needed for Pain.         ?  Cholecalciferol, Vitamin D3, 3,000 unit tab  Take  by mouth.               ?  ibuprofen 200 mg cap  Take  by mouth.               REVIEW OF SYSTEMS:   Constitutional: Fever: No   Skin: Rash: No   HEENT: Hearing difficulty: No   Eyes: Blurred vision: No   Cardiovascular: Chest pain: No   Respiratory: Shortness of breath: No   Gastrointestinal: Nausea/vomiting: No   Musculoskeletal: Back pain: No   Neurological: Weakness: No   Psychological: Memory loss: No   Comments/additional findings:          PHYSICAL EXAMINATION:    Visit Vitals      Temp  97.1 ??F (36.2 ??C) (Temporal)     Ht  6\' 1"  (1.854 m)     Wt  285 lb (129.3 kg)        BMI  37.60 kg/m??        Constitutional: Well developed, no acute distress.    Eyes: Conjunctiva normal.   Ears: External ear normal.   Nose/Throat: External nose normal.   CV: Heart rate regular.  No peripheral swelling noted.   Respiratory: No respiratory distress. No audible wheeze.   Skin: No rash. No ulcer.      Neuro/Psych: Patient with appropriate affect. Alert and oriented x 3.      Gait: Normal.   GU: Prostate severely enlarged.  Testes within normal limits.      REVIEW OF LABS AND IMAGING:     No labs or radiology this visit.          ASSESSMENT:              ICD-10-CM  ICD-9-CM              1.  BPH with obstruction/lower urinary tract symptoms   N40.1  600.01              N13.8  599.69             2.  Erectile dysfunction of organic origin   N52.9  607.84             3.  Personal history of urinary (tract) infections   Z87.440  V13.02              PLAN:     ??  Cont TriMix 0.5945mL and finasteride   ??  Follow-up in 1 year  Patient's BMI is out of the normal parameters.  Information about BMI was given and patient was advised to follow-up with their PCP for further management.        Chief Complaint       Patient presents with        ?  Follow-up             6 mo f/u BPH & ED        ?  Benign Prostatic Hypertrophy        [employer] Life[employer]i Taylor Levick, DO

## 2019-12-31 DIAGNOSIS — R7303 Prediabetes: Secondary | ICD-10-CM | POA: Insufficient documentation

## 2020-03-12 ENCOUNTER — Encounter: Payer: MEDICARE | Attending: Urology

## 2020-06-04 DIAGNOSIS — M2040 Other hammer toe(s) (acquired), unspecified foot: Secondary | ICD-10-CM | POA: Insufficient documentation

## 2020-06-04 DIAGNOSIS — M2010 Hallux valgus (acquired), unspecified foot: Secondary | ICD-10-CM | POA: Insufficient documentation

## 2020-06-04 DIAGNOSIS — M79673 Pain in unspecified foot: Secondary | ICD-10-CM | POA: Insufficient documentation

## 2020-06-04 DIAGNOSIS — L600 Ingrowing nail: Secondary | ICD-10-CM | POA: Insufficient documentation

## 2020-09-28 ENCOUNTER — Telehealth: Payer: Self-pay | Admitting: "Endocrinology

## 2020-09-28 NOTE — Telephone Encounter (Signed)
Pt called the after hour line, I contacted pt, he wants to set up an appt. Informed him we would need a referral and review it and contact him once we received it

## 2020-11-30 DIAGNOSIS — Z85828 Personal history of other malignant neoplasm of skin: Secondary | ICD-10-CM | POA: Insufficient documentation

## 2021-04-23 ENCOUNTER — Emergency Department (HOSPITAL_COMMUNITY)
Admission: EM | Admit: 2021-04-23 | Discharge: 2021-04-23 | Disposition: A | Discharge status: Home / Self Care | Payer: No Typology Code available for payment source | Attending: Emergency Medicine | Admitting: Emergency Medicine

## 2021-04-23 ENCOUNTER — Emergency Department (HOSPITAL_COMMUNITY): Payer: No Typology Code available for payment source

## 2021-04-23 ENCOUNTER — Encounter (HOSPITAL_COMMUNITY): Payer: Self-pay

## 2021-04-23 ENCOUNTER — Other Ambulatory Visit: Payer: Self-pay

## 2021-04-23 DIAGNOSIS — K6289 Other specified diseases of anus and rectum: Secondary | ICD-10-CM | POA: Diagnosis not present

## 2021-04-23 DIAGNOSIS — R600 Localized edema: Secondary | ICD-10-CM | POA: Insufficient documentation

## 2021-04-23 DIAGNOSIS — K5900 Constipation, unspecified: Secondary | ICD-10-CM | POA: Diagnosis not present

## 2021-04-23 DIAGNOSIS — R309 Painful micturition, unspecified: Secondary | ICD-10-CM | POA: Diagnosis not present

## 2021-04-23 DIAGNOSIS — Z79899 Other long term (current) drug therapy: Secondary | ICD-10-CM | POA: Diagnosis not present

## 2021-04-23 DIAGNOSIS — M7989 Other specified soft tissue disorders: Secondary | ICD-10-CM

## 2021-04-23 DIAGNOSIS — I10 Essential (primary) hypertension: Secondary | ICD-10-CM | POA: Insufficient documentation

## 2021-04-23 HISTORY — DX: Unspecified osteoarthritis, unspecified site: M19.90

## 2021-04-23 HISTORY — DX: Essential (primary) hypertension: I10

## 2021-04-23 LAB — URINALYSIS, ROUTINE W REFLEX MICROSCOPIC
Bilirubin Urine: NEGATIVE
Glucose, UA: NEGATIVE mg/dL
Hgb urine dipstick: NEGATIVE
Ketones, ur: NEGATIVE mg/dL
Leukocytes,Ua: NEGATIVE
Nitrite: NEGATIVE
Protein, ur: NEGATIVE mg/dL
Specific Gravity, Urine: 1.017 (ref 1.005–1.030)
pH: 5 (ref 5.0–8.0)

## 2021-04-23 LAB — CBC WITH DIFFERENTIAL/PLATELET
Abs Immature Granulocytes: 0 10*3/uL (ref 0.00–0.07)
Basophils Absolute: 0 10*3/uL (ref 0.0–0.1)
Basophils Relative: 1 %
Eosinophils Absolute: 0.2 10*3/uL (ref 0.0–0.5)
Eosinophils Relative: 3 %
HCT: 39.1 % (ref 39.0–52.0)
Hemoglobin: 13 g/dL (ref 13.0–17.0)
Immature Granulocytes: 0 %
Lymphocytes Relative: 24 %
Lymphs Abs: 1.3 10*3/uL (ref 0.7–4.0)
MCH: 32.3 pg (ref 26.0–34.0)
MCHC: 33.2 g/dL (ref 30.0–36.0)
MCV: 97.3 fL (ref 80.0–100.0)
Monocytes Absolute: 0.5 10*3/uL (ref 0.1–1.0)
Monocytes Relative: 10 %
Neutro Abs: 3.4 10*3/uL (ref 1.7–7.7)
Neutrophils Relative %: 62 %
Platelets: 165 10*3/uL (ref 150–400)
RBC: 4.02 MIL/uL — ABNORMAL LOW (ref 4.22–5.81)
RDW: 13 % (ref 11.5–15.5)
WBC: 5.5 10*3/uL (ref 4.0–10.5)
nRBC: 0 % (ref 0.0–0.2)

## 2021-04-23 LAB — COMPREHENSIVE METABOLIC PANEL
ALT: 24 U/L (ref 0–44)
AST: 28 U/L (ref 15–41)
Albumin: 4.2 g/dL (ref 3.5–5.0)
Alkaline Phosphatase: 53 U/L (ref 38–126)
Anion gap: 5 (ref 5–15)
BUN: 22 mg/dL (ref 8–23)
CO2: 25 mmol/L (ref 22–32)
Calcium: 9.2 mg/dL (ref 8.9–10.3)
Chloride: 105 mmol/L (ref 98–111)
Creatinine, Ser: 1.14 mg/dL (ref 0.61–1.24)
GFR, Estimated: >60 mL/min (ref >60)
Glucose, Bld: 104 mg/dL — ABNORMAL HIGH (ref 70–99)
Potassium: 4.2 mmol/L (ref 3.5–5.1)
Sodium: 135 mmol/L (ref 135–145)
Total Bilirubin: 0.9 mg/dL (ref 0.3–1.2)
Total Protein: 8.2 g/dL — ABNORMAL HIGH (ref 6.5–8.1)

## 2021-04-23 LAB — LIPASE, BLOOD: Lipase: 24 U/L (ref 11–51)

## 2021-04-23 MED ORDER — IOHEXOL 300 MG/ML  SOLN
100.0000 mL | Freq: Once | INTRAMUSCULAR | Status: AC | PRN
  Administered 2021-04-23: 100 mL via INTRAVENOUS

## 2021-04-23 NOTE — ED Triage Notes (Signed)
Pt to er room number 11 via wheel chair, states that for the past week he has had some rectal pain when he has a bowel movement, states that he also has penile pain when he urinates.  States that in the beginning of September he was treated for cellulitis with bactrim, states that he had a reaction to bactrim and he started having constipation.  States that with the constipation and hemorrhoids and fatigue.  States that he saw the gi doc and was given steroids without relief.

## 2021-04-23 NOTE — Discharge Instructions (Addendum)
Take the MiraLAX 4 times a day.  For the constipation moderate amount seen on CT scan.  Follow-up with your gastroenterologist in [place] area.  Make an appointment to follow-up with cardiology here.  Based on the chronic bilateral lower extremity swelling.  Suspect there may be a degree of congestive heart failure.  Continue your current medications.

## 2021-04-23 NOTE — ED Notes (Signed)
Checked on pt. Put a Pure wick on pt.

## 2021-04-23 NOTE — ED Provider Notes (Signed)
[facility] EMERGENCY DEPARTMENT Provider Note   CSN: [id number] Arrival date & time: 04/23/21  1038     History Chief Complaint  Patient presents with   Rectal Pain    Patrick Kerr is a 76 y.o. male.  Patient accompanied by his daughter.  Patient has complaint of burning with urination and also has pain in the rectal area with bowel movements.  This is been going on for a while.  Patient has been seen by his gastroenterologist in [place].  Felt that he had hemorrhoids and constipation and is taking stool softeners MiraLAX and he is also on steroids for the rectal pain.  All seem to start in the patient's mind when he was treated for bilateral lower extremity cellulitis with Bactrim.  Seen dermatology they felt this is chronic lower extremity edema.  Not cellulitis.  Patient not getting any better that is why he is here today.  Patient not followed by cardiology.  Used to be followed by the [facility].      Past Medical History:  Diagnosis Date   Arthritis    Hypertension     Patient Active Problem List   Diagnosis Date Noted   Protrusion of cervical intervertebral disc 07/20/2017   Status post lumbar spinal fusion 07/20/2017    Past Surgical History:  Procedure Laterality Date   TRANSURETHRAL RESECTION OF PROSTATE         History reviewed. No pertinent family history.  Social History   Tobacco Use   Smoking status: Never   Smokeless tobacco: Never  Vaping Use   Vaping Use: Every day  Substance Use Topics   Alcohol use: No   Drug use: No    Home Medications Prior to Admission medications   Medication Sig Start Date End Date Taking? Authorizing Provider  cyanocobalamin (,VITAMIN B-12,) 1000 MCG/ML injection Inject into the muscle.    [provider]  hydrochlorothiazide (HYDRODIURIL) 25 MG tablet Take by mouth.    [provider]  levothyroxine (SYNTHROID, LEVOTHROID) 137 MCG tablet Take by mouth.    [provider]   LORazepam (ATIVAN) 1 MG tablet Take by mouth.    [provider]  sertraline (ZOLOFT) 100 MG tablet Take by mouth.    [provider]    Allergies    Amitriptyline, Azithromycin, Bactrim [sulfamethoxazole-trimethoprim], Erythromycin, Mirabegron, Prednisone, and Hydromorphone  Review of Systems   Review of Systems  Constitutional:  Negative for chills and fever.  HENT:  Negative for ear pain and sore throat.   Eyes:  Negative for pain and visual disturbance.  Respiratory:  Negative for cough and shortness of breath.   Cardiovascular:  Positive for leg swelling. Negative for chest pain and palpitations.  Gastrointestinal:  Positive for constipation and rectal pain. Negative for abdominal pain, blood in stool and vomiting.  Genitourinary:  Positive for dysuria and penile pain. Negative for hematuria.  Musculoskeletal:  Negative for arthralgias and back pain.  Skin:  Negative for color change and rash.  Neurological:  Negative for seizures and syncope.  All other systems reviewed and are negative.  Physical Exam Updated Vital Signs BP 120/63 (BP Location: Right Arm)   Pulse 90   Temp 98.1 F (36.7 C) (Oral)   Resp 20   Ht 1.93 m (6\' 4" )   Wt 127 kg   SpO2 97%   BMI 34.08 kg/m   Physical Exam Vitals and nursing note reviewed.  Constitutional:      Appearance: Normal appearance. He  is well-developed.  HENT:     Head: Normocephalic and atraumatic.  Eyes:     Extraocular Movements: Extraocular movements intact.     Conjunctiva/sclera: Conjunctivae normal.     Pupils: Pupils are equal, round, and reactive to light.  Cardiovascular:     Rate and Rhythm: Normal rate and regular rhythm.     Heart sounds: No murmur heard. Pulmonary:     Effort: Pulmonary effort is normal. No respiratory distress.     Breath sounds: Normal breath sounds.  Abdominal:     Palpations: Abdomen is soft.     Tenderness: There is no abdominal tenderness.  Genitourinary:     Comments: Rectal area without any external hemorrhoids there is some excoriation around the perianal area.  No prolapsed internal hemorrhoids.  Some discomfort in the rectal area.  GU area penis without any erythema and nontender.  No bloody discharge.  Scrotum without any tenderness or swelling. Musculoskeletal:        General: Swelling present. Normal range of motion.     Cervical back: Normal range of motion and neck supple.     Right lower leg: Edema present.     Left lower leg: Edema present.     Comments: Erythema both lower extremities chronic skin changes secondary to the edema.  Good cap refill to the toes.  Skin:    General: Skin is warm and dry.     Capillary Refill: Capillary refill takes less than 2 seconds.  Neurological:     General: No focal deficit present.     Mental Status: He is alert and oriented to person, place, and time.     Cranial Nerves: No cranial nerve deficit.     Sensory: No sensory deficit.     Motor: No weakness.    ED Results / Procedures / Treatments   Labs (all labs ordered are listed, but only abnormal results are displayed) Labs Reviewed  CBC WITH DIFFERENTIAL/PLATELET - Abnormal; Notable for the following components:      Result Value   RBC 4.02 (*)    All other components within normal limits  COMPREHENSIVE METABOLIC PANEL - Abnormal; Notable for the following components:   Glucose, Bld 104 (*)    Total Protein 8.2 (*)    All other components within normal limits  LIPASE, BLOOD  URINALYSIS, ROUTINE W REFLEX MICROSCOPIC    EKG EKG Interpretation  Date/Time:  Friday April 23 2021 11:02:10 EDT Ventricular Rate:  90 PR Interval:  160 QRS Duration: 169 QT Interval:  399 QTC Calculation: 489 R Axis:   -83 Text Interpretation: Sinus rhythm RBBB and LAFB No previous ECGs available Confirmed by Vanetta Mulders [phone]) on 04/23/2021 11:51:25 AM  Radiology CT Abdomen Pelvis W Contrast  Result Date: 04/23/2021 CLINICAL DATA:  Abdominal  pain, acute, nonlocalized. Rectal pain. Penile pain EXAM: CT ABDOMEN AND PELVIS WITH CONTRAST TECHNIQUE: Multidetector CT imaging of the abdomen and pelvis was performed using the standard protocol following bolus administration of intravenous contrast. CONTRAST:  OMNIPAQUE IOHEXOL 300 MG/ML  SOLN COMPARISON:  None. FINDINGS: Lower chest: Mild bibasilar atelectasis. Heart size within normal limits. Hepatobiliary: No focal liver abnormality is seen. No gallstones, gallbladder wall thickening, or biliary dilatation. Pancreas: Unremarkable. No pancreatic ductal dilatation or surrounding inflammatory changes. Spleen: Normal in size without focal abnormality. Adrenals/Urinary Tract: Slightly nodular configuration of the bilateral adrenal glands, likely reflecting adrenal hyperplasia. 4 mm nonobstructing stone within the lower pole of the left kidney. Kidneys are otherwise within normal  limits. No hydronephrosis. Bilateral ureters are unremarkable. Trabeculated appearance of the urinary bladder. Stomach/Bowel: Stomach is within normal limits. Appendix appears normal (series 2, image 54). Minimal scattered colonic diverticulosis. No evidence of bowel wall thickening, distention, or inflammatory changes. Moderate volume of stool throughout the colon. Vascular/Lymphatic: Extensive atherosclerotic calcifications throughout the aortoiliac axis. No aneurysm. No abdominopelvic lymphadenopathy. Reproductive: Brachytherapy seeds within the prostate bed. Other: No free fluid. No abdominopelvic fluid collection. No pneumoperitoneum. Musculoskeletal: Postsurgical changes of prior L3-L5 spinal fusion. Bridging anterior osteophytes of the thoracolumbar spine compatible with DISH. No acute bony findings. IMPRESSION: 1. No acute abdominopelvic findings. 2. Moderate volume of stool throughout the colon. 3. Nonobstructing 4 mm left renal stone. Aortic Atherosclerosis (ICD10-I70.0). Electronically Signed   By: Duanne Guess D.O.    On: 04/23/2021 14:40    Procedures Procedures   Medications Ordered in ED Medications  iohexol (OMNIPAQUE) 300 MG/ML solution 100 mL (100 mLs Intravenous Contrast Given 04/23/21 1347)    ED Course  I have reviewed the triage vital signs and the nursing notes.  Pertinent labs & imaging results that were available during my care of the patient were reviewed by me and considered in my medical decision making (see chart for details).    MDM Rules/Calculators/A&P                           CT scan showed moderate stool burden.  No signs of obstruction.  No other acute abnormalities.  Urinalysis normal no leukocytosis.  Hemoglobin stable at 13.  Patient's lipase normal renal function normal liver function test normal.  Clinically suspect with the bilateral lower extremity swelling patient has a component of some heart failure.  We will have him follow-up with cardiology.  Based on the CT scan of the constipation we will have him follow back up with his gastroenterologist in [place].  And also have him follow-up with cardiology here for further work-up.  We will have patient use MiraLAX 4 times a day.  And follow back up with his gastroenterologist.   Final Clinical Impression(s) / ED Diagnoses Final diagnoses:  Rectal pain  Constipation, unspecified constipation type  Leg swelling    Rx / DC Orders ED Discharge Orders     None        Vanetta Mulders, MD 04/23/21 1528

## 2021-05-25 HISTORY — PX: CIRCUMCISION: SUR203

## 2021-07-09 ENCOUNTER — Ambulatory Visit: Payer: No Typology Code available for payment source | Admitting: Urology

## 2021-07-16 ENCOUNTER — Ambulatory Visit: Payer: No Typology Code available for payment source | Admitting: Urology

## 2021-07-17 ENCOUNTER — Other Ambulatory Visit: Payer: Self-pay

## 2021-07-17 ENCOUNTER — Inpatient Hospital Stay (HOSPITAL_COMMUNITY)
Admission: EM | Admit: 2021-07-17 | Discharge: 2021-07-29 | DRG: 871 | Disposition: A | Discharge status: Home Health | Payer: No Typology Code available for payment source | Attending: Family Medicine | Admitting: Family Medicine

## 2021-07-17 ENCOUNTER — Emergency Department (HOSPITAL_COMMUNITY): Payer: No Typology Code available for payment source

## 2021-07-17 ENCOUNTER — Encounter (HOSPITAL_COMMUNITY): Payer: Self-pay

## 2021-07-17 DIAGNOSIS — Z01818 Encounter for other preprocedural examination: Secondary | ICD-10-CM

## 2021-07-17 DIAGNOSIS — Z79890 Hormone replacement therapy (postmenopausal): Secondary | ICD-10-CM

## 2021-07-17 DIAGNOSIS — D696 Thrombocytopenia, unspecified: Secondary | ICD-10-CM | POA: Diagnosis present

## 2021-07-17 DIAGNOSIS — R059 Cough, unspecified: Secondary | ICD-10-CM

## 2021-07-17 DIAGNOSIS — R6521 Severe sepsis with septic shock: Secondary | ICD-10-CM | POA: Diagnosis present

## 2021-07-17 DIAGNOSIS — N138 Other obstructive and reflux uropathy: Secondary | ICD-10-CM | POA: Diagnosis present

## 2021-07-17 DIAGNOSIS — N401 Enlarged prostate with lower urinary tract symptoms: Secondary | ICD-10-CM | POA: Diagnosis present

## 2021-07-17 DIAGNOSIS — Z883 Allergy status to other anti-infective agents status: Secondary | ICD-10-CM

## 2021-07-17 DIAGNOSIS — F4310 Post-traumatic stress disorder, unspecified: Secondary | ICD-10-CM | POA: Diagnosis present

## 2021-07-17 DIAGNOSIS — Z6834 Body mass index (BMI) 34.0-34.9, adult: Secondary | ICD-10-CM

## 2021-07-17 DIAGNOSIS — N201 Calculus of ureter: Secondary | ICD-10-CM

## 2021-07-17 DIAGNOSIS — Z20822 Contact with and (suspected) exposure to covid-19: Secondary | ICD-10-CM | POA: Diagnosis present

## 2021-07-17 DIAGNOSIS — R451 Restlessness and agitation: Secondary | ICD-10-CM | POA: Diagnosis not present

## 2021-07-17 DIAGNOSIS — R7989 Other specified abnormal findings of blood chemistry: Secondary | ICD-10-CM | POA: Diagnosis present

## 2021-07-17 DIAGNOSIS — R531 Weakness: Secondary | ICD-10-CM | POA: Diagnosis present

## 2021-07-17 DIAGNOSIS — F32A Depression, unspecified: Secondary | ICD-10-CM | POA: Diagnosis present

## 2021-07-17 DIAGNOSIS — G9341 Metabolic encephalopathy: Secondary | ICD-10-CM | POA: Diagnosis present

## 2021-07-17 DIAGNOSIS — N3001 Acute cystitis with hematuria: Secondary | ICD-10-CM

## 2021-07-17 DIAGNOSIS — R55 Syncope and collapse: Secondary | ICD-10-CM | POA: Diagnosis present

## 2021-07-17 DIAGNOSIS — N179 Acute kidney failure, unspecified: Secondary | ICD-10-CM | POA: Diagnosis present

## 2021-07-17 DIAGNOSIS — E876 Hypokalemia: Secondary | ICD-10-CM | POA: Diagnosis present

## 2021-07-17 DIAGNOSIS — K5909 Other constipation: Secondary | ICD-10-CM | POA: Diagnosis present

## 2021-07-17 DIAGNOSIS — Z981 Arthrodesis status: Secondary | ICD-10-CM

## 2021-07-17 DIAGNOSIS — E872 Acidosis, unspecified: Secondary | ICD-10-CM | POA: Diagnosis present

## 2021-07-17 DIAGNOSIS — N320 Bladder-neck obstruction: Secondary | ICD-10-CM | POA: Diagnosis present

## 2021-07-17 DIAGNOSIS — A419 Sepsis, unspecified organism: Secondary | ICD-10-CM

## 2021-07-17 DIAGNOSIS — B001 Herpesviral vesicular dermatitis: Secondary | ICD-10-CM | POA: Diagnosis not present

## 2021-07-17 DIAGNOSIS — K121 Other forms of stomatitis: Secondary | ICD-10-CM | POA: Diagnosis not present

## 2021-07-17 DIAGNOSIS — L8990 Pressure ulcer of unspecified site, unspecified stage: Secondary | ICD-10-CM | POA: Diagnosis present

## 2021-07-17 DIAGNOSIS — L89152 Pressure ulcer of sacral region, stage 2: Secondary | ICD-10-CM | POA: Diagnosis not present

## 2021-07-17 DIAGNOSIS — R1310 Dysphagia, unspecified: Secondary | ICD-10-CM | POA: Diagnosis not present

## 2021-07-17 DIAGNOSIS — Z79899 Other long term (current) drug therapy: Secondary | ICD-10-CM

## 2021-07-17 DIAGNOSIS — M1990 Unspecified osteoarthritis, unspecified site: Secondary | ICD-10-CM | POA: Diagnosis present

## 2021-07-17 DIAGNOSIS — D6489 Other specified anemias: Secondary | ICD-10-CM | POA: Diagnosis present

## 2021-07-17 DIAGNOSIS — Z885 Allergy status to narcotic agent status: Secondary | ICD-10-CM

## 2021-07-17 DIAGNOSIS — F17290 Nicotine dependence, other tobacco product, uncomplicated: Secondary | ICD-10-CM | POA: Diagnosis present

## 2021-07-17 DIAGNOSIS — E039 Hypothyroidism, unspecified: Secondary | ICD-10-CM | POA: Diagnosis present

## 2021-07-17 DIAGNOSIS — Z888 Allergy status to other drugs, medicaments and biological substances status: Secondary | ICD-10-CM

## 2021-07-17 DIAGNOSIS — E669 Obesity, unspecified: Secondary | ICD-10-CM | POA: Diagnosis present

## 2021-07-17 DIAGNOSIS — B009 Herpesviral infection, unspecified: Secondary | ICD-10-CM | POA: Diagnosis present

## 2021-07-17 DIAGNOSIS — I700 Atherosclerosis of aorta: Secondary | ICD-10-CM | POA: Diagnosis present

## 2021-07-17 DIAGNOSIS — R0602 Shortness of breath: Secondary | ICD-10-CM

## 2021-07-17 DIAGNOSIS — Z9079 Acquired absence of other genital organ(s): Secondary | ICD-10-CM

## 2021-07-17 DIAGNOSIS — D539 Nutritional anemia, unspecified: Secondary | ICD-10-CM | POA: Diagnosis present

## 2021-07-17 DIAGNOSIS — B961 Klebsiella pneumoniae [K. pneumoniae] as the cause of diseases classified elsewhere: Secondary | ICD-10-CM | POA: Diagnosis present

## 2021-07-17 DIAGNOSIS — J9601 Acute respiratory failure with hypoxia: Secondary | ICD-10-CM | POA: Diagnosis present

## 2021-07-17 DIAGNOSIS — Z881 Allergy status to other antibiotic agents status: Secondary | ICD-10-CM

## 2021-07-17 DIAGNOSIS — I10 Essential (primary) hypertension: Secondary | ICD-10-CM | POA: Diagnosis present

## 2021-07-17 DIAGNOSIS — A4159 Other Gram-negative sepsis: Principal | ICD-10-CM | POA: Diagnosis present

## 2021-07-17 DIAGNOSIS — J9690 Respiratory failure, unspecified, unspecified whether with hypoxia or hypercapnia: Secondary | ICD-10-CM

## 2021-07-17 LAB — CBC WITH DIFFERENTIAL/PLATELET
Abs Immature Granulocytes: 0.02 10*3/uL (ref 0.00–0.07)
Basophils Absolute: 0 10*3/uL (ref 0.0–0.1)
Basophils Relative: 1 %
Eosinophils Absolute: 0.3 10*3/uL (ref 0.0–0.5)
Eosinophils Relative: 6 %
HCT: 34.7 % — ABNORMAL LOW (ref 39.0–52.0)
Hemoglobin: 11.5 g/dL — ABNORMAL LOW (ref 13.0–17.0)
Immature Granulocytes: 0 %
Lymphocytes Relative: 20 %
Lymphs Abs: 1.1 10*3/uL (ref 0.7–4.0)
MCH: 34.7 pg — ABNORMAL HIGH (ref 26.0–34.0)
MCHC: 33.1 g/dL (ref 30.0–36.0)
MCV: 104.8 fL — ABNORMAL HIGH (ref 80.0–100.0)
Monocytes Absolute: 0.5 10*3/uL (ref 0.1–1.0)
Monocytes Relative: 9 %
Neutro Abs: 3.4 10*3/uL (ref 1.7–7.7)
Neutrophils Relative %: 64 %
Platelets: 140 10*3/uL — ABNORMAL LOW (ref 150–400)
RBC: 3.31 MIL/uL — ABNORMAL LOW (ref 4.22–5.81)
RDW: 12 % (ref 11.5–15.5)
WBC: 5.3 10*3/uL (ref 4.0–10.5)
nRBC: 0 % (ref 0.0–0.2)

## 2021-07-17 LAB — COMPREHENSIVE METABOLIC PANEL
ALT: 17 U/L (ref 0–44)
AST: 19 U/L (ref 15–41)
Albumin: 3.6 g/dL (ref 3.5–5.0)
Alkaline Phosphatase: 49 U/L (ref 38–126)
Anion gap: 9 (ref 5–15)
BUN: 17 mg/dL (ref 8–23)
CO2: 26 mmol/L (ref 22–32)
Calcium: 8.9 mg/dL (ref 8.9–10.3)
Chloride: 103 mmol/L (ref 98–111)
Creatinine, Ser: 0.95 mg/dL (ref 0.61–1.24)
GFR, Estimated: >60 mL/min (ref >60)
Glucose, Bld: 112 mg/dL — ABNORMAL HIGH (ref 70–99)
Potassium: 3 mmol/L — ABNORMAL LOW (ref 3.5–5.1)
Sodium: 138 mmol/L (ref 135–145)
Total Bilirubin: 0.9 mg/dL (ref 0.3–1.2)
Total Protein: 6.9 g/dL (ref 6.5–8.1)

## 2021-07-17 LAB — URINALYSIS, ROUTINE W REFLEX MICROSCOPIC
Bilirubin Urine: NEGATIVE
Glucose, UA: NEGATIVE mg/dL
Ketones, ur: 5 mg/dL — AB
Nitrite: POSITIVE — AB
Protein, ur: 30 mg/dL — AB
RBC / HPF: >50 RBC/hpf — ABNORMAL HIGH (ref 0–5)
Specific Gravity, Urine: >1.046 — ABNORMAL HIGH (ref 1.005–1.030)
WBC, UA: >50 WBC/hpf — ABNORMAL HIGH (ref 0–5)
pH: 5 (ref 5.0–8.0)

## 2021-07-17 LAB — LACTIC ACID, PLASMA
Lactic Acid, Venous: 5.7 mmol/L (ref 0.5–1.9)
Lactic Acid, Venous: 4.4 mmol/L (ref 0.5–1.9)

## 2021-07-17 LAB — RESP PANEL BY RT-PCR (FLU A&B, COVID) ARPGX2
Influenza A by PCR: NEGATIVE
Influenza B by PCR: NEGATIVE
SARS Coronavirus 2 by RT PCR: NEGATIVE

## 2021-07-17 LAB — MAGNESIUM: Magnesium: 1.2 mg/dL — ABNORMAL LOW (ref 1.7–2.4)

## 2021-07-17 LAB — CBG MONITORING, ED
Glucose-Capillary: 107 mg/dL — ABNORMAL HIGH (ref 70–99)
Glucose-Capillary: 127 mg/dL — ABNORMAL HIGH (ref 70–99)

## 2021-07-17 LAB — TSH: TSH: 0.053 u[IU]/mL — ABNORMAL LOW (ref 0.350–4.500)

## 2021-07-17 MED ORDER — SODIUM CHLORIDE 0.9 % IV BOLUS
1000.0000 mL | Freq: Once | INTRAVENOUS | Status: AC
  Administered 2021-07-17: 1000 mL via INTRAVENOUS

## 2021-07-17 MED ORDER — PHENAZOPYRIDINE HCL 200 MG PO TABS: 200.0000 mg | ORAL_TABLET | Freq: Three times a day (TID) | ORAL | 0 refills | Status: DC

## 2021-07-17 MED ORDER — SODIUM CHLORIDE 0.9 % IV BOLUS
500.0000 mL | Freq: Once | INTRAVENOUS | Status: AC
  Administered 2021-07-17: 500 mL via INTRAVENOUS

## 2021-07-17 MED ORDER — LACTATED RINGERS BOLUS PEDS
1000.0000 mL | Freq: Once | INTRAVENOUS | Status: AC
  Administered 2021-07-17: 1000 mL via INTRAVENOUS

## 2021-07-17 MED ORDER — ACETAMINOPHEN 325 MG PO TABS
650.0000 mg | ORAL_TABLET | Freq: Once | ORAL | Status: AC
  Administered 2021-07-17: 650 mg via ORAL
  Filled 2021-07-17: qty 2

## 2021-07-17 MED ORDER — PHENAZOPYRIDINE HCL 100 MG PO TABS
200.0000 mg | ORAL_TABLET | Freq: Once | ORAL | Status: AC
  Administered 2021-07-17: 200 mg via ORAL
  Filled 2021-07-17: qty 2

## 2021-07-17 MED ORDER — SODIUM CHLORIDE 0.9 % IV SOLN
1.0000 g | Freq: Once | INTRAVENOUS | Status: AC
  Administered 2021-07-17: 1 g via INTRAVENOUS
  Filled 2021-07-17: qty 10

## 2021-07-17 MED ORDER — POTASSIUM CHLORIDE 10 MEQ/100ML IV SOLN
10.0000 meq | INTRAVENOUS | Status: AC
  Administered 2021-07-17 (×2): 10 meq via INTRAVENOUS
  Filled 2021-07-17 (×2): qty 100

## 2021-07-17 MED ORDER — MAGNESIUM SULFATE 2 GM/50ML IV SOLN
2.0000 g | Freq: Once | INTRAVENOUS | Status: AC
  Administered 2021-07-17: 2 g via INTRAVENOUS
  Filled 2021-07-17: qty 50

## 2021-07-17 MED ORDER — NOREPINEPHRINE 4 MG/250ML-% IV SOLN
0.0000 ug/min | INTRAVENOUS | Status: DC
  Administered 2021-07-17: 23:00:00 2 ug/min via INTRAVENOUS
  Administered 2021-07-18: 19:00:00 7 ug/min via INTRAVENOUS
  Administered 2021-07-18: 10:00:00 10 ug/min via INTRAVENOUS
  Administered 2021-07-18: 06:00:00 20 ug/min via INTRAVENOUS
  Administered 2021-07-18: 04:00:00 30 ug/min via INTRAVENOUS
  Administered 2021-07-19: 03:00:00 6 ug/min via INTRAVENOUS
  Administered 2021-07-19 (×2): 11 ug/min via INTRAVENOUS
  Administered 2021-07-20: 8 ug/min via INTRAVENOUS
  Filled 2021-07-17 (×9): qty 250

## 2021-07-17 MED ORDER — POTASSIUM CHLORIDE CRYS ER 20 MEQ PO TBCR
40.0000 meq | EXTENDED_RELEASE_TABLET | Freq: Once | ORAL | Status: AC
  Administered 2021-07-17: 40 meq via ORAL
  Filled 2021-07-17: qty 2

## 2021-07-17 MED ORDER — CEPHALEXIN 500 MG PO CAPS: 500.0000 mg | ORAL_CAPSULE | Freq: Four times a day (QID) | ORAL | 0 refills | Status: DC

## 2021-07-17 MED ORDER — IOHEXOL 300 MG/ML  SOLN
100.0000 mL | Freq: Once | INTRAMUSCULAR | Status: AC | PRN
  Administered 2021-07-17: 100 mL via INTRAVENOUS

## 2021-07-17 MED ORDER — ONDANSETRON HCL 4 MG/2ML IJ SOLN
4.0000 mg | Freq: Once | INTRAMUSCULAR | Status: AC
  Administered 2021-07-17: 4 mg via INTRAVENOUS
  Filled 2021-07-17: qty 2

## 2021-07-17 NOTE — ED Notes (Addendum)
Pt saturation 94% in between tremors. Pt placed on 2l per edp request

## 2021-07-17 NOTE — ED Provider Notes (Signed)
6:05 PM - I was called to the patient's room because of a sudden change in his status when they were attempting to discharge him.  He had presented earlier for urinary tract problems.  Nurse that has been with him today states that he had some trouble getting a urinalysis, and urine I and O cath was attempted .  The patient apparently slumped over in a wheelchair, and had to be placed back in the stretcher for evaluation.  For a bit of time he was not talking but then began talking as I entered the room.  Patient appeared pale, but was conversant.  He was somewhat confused and could not give complete history.  Family members with him at this time.  CBG and blood pressure checked acutely and are stable.  Ms. Lincoln Maxin summoned to the room for assessment, since she had seen him and discharged him earlier.  Patient has now developed a fever.  In light of prior evaluation, urosepsis is highly likely.  Will obtain additional labs, arrange for admission  Results for orders placed or performed during the hospital encounter of 07/17/21  Blood culture (routine x 2)   Specimen: Blood  Result Value Ref Range   Specimen Description      RIGHT ANTECUBITAL BOTTLES DRAWN AEROBIC AND ANAEROBIC   Special Requests      Blood Culture adequate volume Performed at [facility], [address] [place], [place] [address]    Culture PENDING    Report Status PENDING   Blood culture (routine x 2)   Specimen: Blood  Result Value Ref Range   Specimen Description      LEFT ANTECUBITAL BOTTLES DRAWN AEROBIC AND ANAEROBIC   Special Requests      Blood Culture results may not be optimal due to an excessive volume of blood received in culture bottles Performed at [facility], [address] [place], [place] [address]    Culture PENDING    Report Status PENDING   Resp Panel by RT-PCR (Flu A&B, Covid) Nasopharyngeal Swab   Specimen: Nasopharyngeal Swab; Nasopharyngeal(NP) swabs in vial transport medium  Result Value Ref  Range   SARS Coronavirus 2 by RT PCR NEGATIVE NEGATIVE   Influenza A by PCR NEGATIVE NEGATIVE   Influenza B by PCR NEGATIVE NEGATIVE  Urinalysis, Routine w reflex microscopic Urine, Clean Catch  Result Value Ref Range   Color, Urine AMBER (A) YELLOW   APPearance CLOUDY (A) CLEAR   Specific Gravity, Urine >1.046 (H) 1.005 - 1.030   pH 5.0 5.0 - 8.0   Glucose, UA NEGATIVE NEGATIVE mg/dL   Hgb urine dipstick LARGE (A) NEGATIVE   Bilirubin Urine NEGATIVE NEGATIVE   Ketones, ur 5 (A) NEGATIVE mg/dL   Protein, ur 30 (A) NEGATIVE mg/dL   Nitrite POSITIVE (A) NEGATIVE   Leukocytes,Ua LARGE (A) NEGATIVE   RBC / HPF >50 (H) 0 - 5 RBC/hpf   WBC, UA >50 (H) 0 - 5 WBC/hpf   Bacteria, UA MANY (A) NONE SEEN   Squamous Epithelial / LPF 0-5 0 - 5   WBC Clumps PRESENT    Mucus PRESENT    Hyaline Casts, UA PRESENT    Granular Casts, UA PRESENT    Non Squamous Epithelial 0-5 (A) NONE SEEN  Comprehensive metabolic panel  Result Value Ref Range   Sodium 138 135 - 145 mmol/L   Potassium 3.0 (L) 3.5 - 5.1 mmol/L   Chloride 103 98 - 111 mmol/L   CO2 26 22 - 32 mmol/L  Glucose, Bld 112 (H) 70 - 99 mg/dL   BUN 17 8 - 23 mg/dL   Creatinine, Ser 7.58 0.61 - 1.24 mg/dL   Calcium 8.9 8.9 - 83.2 mg/dL   Total Protein 6.9 6.5 - 8.1 g/dL   Albumin 3.6 3.5 - 5.0 g/dL   AST 19 15 - 41 U/L   ALT 17 0 - 44 U/L   Alkaline Phosphatase 49 38 - 126 U/L   Total Bilirubin 0.9 0.3 - 1.2 mg/dL   GFR, Estimated >54 >98 mL/min   Anion gap 9 5 - 15  CBC with Differential/Platelet  Result Value Ref Range   WBC 5.3 4.0 - 10.5 K/uL   RBC 3.31 (L) 4.22 - 5.81 MIL/uL   Hemoglobin 11.5 (L) 13.0 - 17.0 g/dL   HCT 26.4 (L) 15.8 - 30.9 %   MCV 104.8 (H) 80.0 - 100.0 fL   MCH 34.7 (H) 26.0 - 34.0 pg   MCHC 33.1 30.0 - 36.0 g/dL   RDW 40.7 68.0 - 88.1 %   Platelets 140 (L) 150 - 400 K/uL   nRBC 0.0 0.0 - 0.2 %   Neutrophils Relative % 64 %   Neutro Abs 3.4 1.7 - 7.7 K/uL   Lymphocytes Relative 20 %   Lymphs Abs 1.1  0.7 - 4.0 K/uL   Monocytes Relative 9 %   Monocytes Absolute 0.5 0.1 - 1.0 K/uL   Eosinophils Relative 6 %   Eosinophils Absolute 0.3 0.0 - 0.5 K/uL   Basophils Relative 1 %   Basophils Absolute 0.0 0.0 - 0.1 K/uL   Immature Granulocytes 0 %   Abs Immature Granulocytes 0.02 0.00 - 0.07 K/uL  TSH  Result Value Ref Range   TSH 0.053 (L) 0.350 - 4.500 uIU/mL  Lactic acid, plasma  Result Value Ref Range   Lactic Acid, Venous 5.7 (HH) 0.5 - 1.9 mmol/L  Lactic acid, plasma  Result Value Ref Range   Lactic Acid, Venous 4.4 (HH) 0.5 - 1.9 mmol/L  CBG monitoring, ED  Result Value Ref Range   Glucose-Capillary 107 (H) 70 - 99 mg/dL  CBG monitoring, ED  Result Value Ref Range   Glucose-Capillary 127 (H) 70 - 99 mg/dL   CT ABDOMEN PELVIS W CONTRAST  Result Date: 07/17/2021 CLINICAL DATA:  Chronic rectal and penile pain. Weight loss and fatigue. EXAM: CT ABDOMEN AND PELVIS WITH CONTRAST TECHNIQUE: Multidetector CT imaging of the abdomen and pelvis was performed using the standard protocol following bolus administration of intravenous contrast. CONTRAST:  OMNIPAQUE IOHEXOL 300 MG/ML  SOLN COMPARISON:  04/23/2021 FINDINGS: Lower chest: No acute abnormality. Hepatobiliary: Liver normal in size. Subcentimeter low-density lesion, anterior aspect of the lateral segment of the left lobe, consistent with a cyst. No other liver masses or lesions. Normal gallbladder. No bile duct dilation. Pancreas: Unremarkable. No pancreatic ductal dilatation or surrounding inflammatory changes. Spleen: Normal in size without focal abnormality. Adrenals/Urinary Tract: Small adrenal nodules, both 11 mm, stable. Kidneys normal in overall size, orientation and position. No renal masses. Small bilateral intrarenal stones. No hydronephrosis. Stones noted in the distal right ureter at and just above the ureterovesicular junction, stable from the prior CT. This causes no signs of obstruction. Normal left ureter. Bladder wall  is irregular with several cellule. No bladder mass or stone. Stomach/Bowel: Stomach is unremarkable. Small bowel and colon are normal in caliber. No wall thickening. No inflammation. Normal appendix visualized. Vascular/Lymphatic: Aortic atherosclerosis. No aneurysm. Scattered subcentimeter retroperitoneal lymph nodes. No enlarged lymph nodes.  Reproductive: Findings consistent with a previous TURP. Densities within the prostate consistent with radiation therapy seeds. These findings are stable. Other: No abdominal wall hernia or abnormality. No abdominopelvic ascites. Musculoskeletal: No fracture or acute finding. No aggressive bone lesion. Previous posterior lumbar spine fusion, L3 through L5. Advanced degenerative changes throughout the visualized spine. IMPRESSION: 1. No acute findings within the abdomen or pelvis. 2. Stones in the distal right ureter without findings of obstruction, and unchanged compared to the prior CT. Small nonobstructing intrarenal stones. 3. 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 4. Aortic atherosclerosis. Electronically Signed   By: Amie Portland M.D.   On: 07/17/2021 13:02   DG ABD ACUTE 2+V W 1V CHEST  Result Date: 07/17/2021 CLINICAL DATA:  Weakness, constipation. EXAM: DG ABDOMEN ACUTE WITH 1 VIEW CHEST COMPARISON:  CT abdomen pelvis 04/23/2021 FINDINGS: There is no evidence of dilated bowel loops or free intraperitoneal air. Thin linear radiopaque calculi projecting in the medial left abdomen correspond to vascular calcifications seen on prior CT 04/23/2021. Surgical clips project at the low central pelvis from prior TURP. Bilateral pedicle screw and rod fixation spans L3-L5 with intervertebral spacers. Severe degenerative disc disease at L1-L2 and L2-L3. Postoperative changes of median sternotomy. Heart size and mediastinal contours are within normal limits. No focal consolidation, pleural effusion, or pneumothorax.  Subsegmental bibasilar atelectasis. IMPRESSION: Nonobstructive bowel gas pattern on abdominal radiographs. No acute cardiopulmonary abnormality. Electronically Signed   By: Sherron Ales M.D.   On: 07/17/2021 11:35     Patient Vitals for the past 24 hrs:  BP Temp Temp src Pulse Resp SpO2 Height Weight  07/17/21 2200 (!) 89/52 -- -- (!) 102 (!) 22 94 % -- --  07/17/21 2130 (!) 89/54 -- -- (!) 103 (!) 21 94 % -- --  07/17/21 2115 (!) 85/50 -- -- (!) 106 (!) 24 94 % -- --  07/17/21 2045 (!) 120/55 -- -- -- (!) 27 -- -- --  07/17/21 2030 (!) 107/59 -- -- (!) 110 (!) 26 95 % -- --  07/17/21 2017 -- (!) 101.1 F (38.4 C) -- -- -- -- -- --  07/17/21 2015 (!) 89/67 -- -- (!) 112 (!) 23 95 % -- --  07/17/21 2000 (!) 103/48 -- -- (!) 109 (!) 22 94 % -- --  07/17/21 1945 (!) 88/61 -- -- (!) 114 (!) 27 96 % -- --  07/17/21 1930 (!) 102/54 -- -- (!) 115 (!) 24 95 % -- --  07/17/21 1903 (!) 90/51 -- -- -- -- -- -- --  07/17/21 1852 -- -- -- 100 19 95 % -- --  07/17/21 1847 (!) 96/31 -- -- -- -- -- -- --  07/17/21 1845 (!) 73/47 -- -- (!) 106 (!) 24 91 % -- --  07/17/21 1832 (!) 86/51 -- -- -- -- -- -- --  07/17/21 1830 (!) 83/55 -- -- -- -- -- -- --  07/17/21 1815 -- (!) 101.8 F (38.8 C) Oral -- -- -- -- --  07/17/21 1730 (!) 106/57 -- -- -- 17 -- -- --  07/17/21 1715 -- -- -- 98 (!) 24 96 % -- --  07/17/21 1700 (!) 154/78 -- -- -- 18 -- -- --  07/17/21 1645 -- 98.4 F (36.9 C) Oral -- -- -- -- --  07/17/21 1630 139/77 -- -- 93 (!) 25 93 % -- --  07/17/21 1600 (!) 143/59 -- -- 73 15 94 % -- --  07/17/21 1530 (!)  143/69 -- -- 80 17 93 % -- --  07/17/21 1500 (!) 149/67 -- -- 73 (!) 25 92 % -- --  07/17/21 1430 (!) 141/70 -- -- 77 20 91 % -- --  07/17/21 1400 127/75 -- -- 81 19 92 % -- --  07/17/21 1300 (!) 169/73 -- -- 78 16 94 % -- --  07/17/21 1230 124/80 -- -- 79 -- 94 % -- --  07/17/21 1200 139/77 -- -- 82 -- 94 % -- --  07/17/21 1130 133/61 -- -- 73 -- 96 % -- --  07/17/21 1100 137/81 --  -- 82 -- 97 % -- --  07/17/21 1030 (!) 139/57 -- -- 76 -- 95 % -- --  07/17/21 1000 -- -- -- -- -- --  (1.93 m) 127 kg  07/17/21 0959 (!) 154/75 97.9 F (36.6 C) Oral 76 18 95 % -- --      Medical Decision Making:  This patient is presenting for evaluation of abnormal labs and found to have UTI with sepsis, which does require a range of treatment options, and is a complaint that involves a high risk of morbidity and mortality.  I decided to review old records, and in summary elderly man with a history of posttraumatic stress disorder, spondylosis, hypertension, prostatic hyperplasia, depression, hypothyroidism and recent phimosis treated with circumcision..  I obtained additional historical information from family members at the bedside.  Clinical Laboratory Tests Ordered, included CBC, Metabolic panel, Urinalysis, and lactate, blood cultures, viral panel . Review indicates normal except urinalysis abnormal consistent with UTI, TSH low, hemoglobin low, potassium low.   Cardiac Monitor Tracing which shows sinus tachycardia   .Critical Care Performed by: Mancel Bale, MD Authorized by: Mancel Bale, MD   Critical care provider statement:    Critical care time (minutes):  35   Critical care start time:  07/17/2021 6:05 PM   Critical care end time:  07/17/2021 9:53 PM   Critical care time was exclusive of:  Separately billable procedures and treating other patients   Critical care was necessary to treat or prevent imminent or life-threatening deterioration of the following conditions:  Sepsis   Critical care was time spent personally by me on the following activities:  Blood draw for specimens, development of treatment plan with patient or surrogate, discussions with consultants, evaluation of patient's response to treatment, examination of patient, ordering and performing treatments and interventions, ordering and review of laboratory studies, ordering and review of radiographic  studies, pulse oximetry, re-evaluation of patient's condition and review of old charts   Critical Interventions-clinical evaluation, laboratory testing, radiography, IV fluids, high-volume saline bolus, empiric antibiotics, discussion with urology, observation and reassessment l  After These Interventions, the Patient was reevaluated and was found with likely bacteremia/sepsis, secondary to UTI with nonobstructing right ureter stone.  Patient likely developing sepsis/bacteremia, during ED visit with transient blood pressure decreased, improved with IV fluids.  Mentation improved after blood pressure improved.  He will require hospitalization.  Repeat lactate after IV fluid bolus of 30 cc/kg, improved but not normal.  CRITICAL CARE- yes Performed by: Mancel Bale  Nursing Notes Reviewed/ Care Coordinated Applicable Imaging Reviewed Interpretation of Laboratory Data incorporated into ED treatment  10:02 PM-Consult complete with hospitalist. Patient case explained and discussed.  He agrees agrees to admit patient for further evaluation and treatment after she receives more IV fluids, and becomes more stable. Call ended at 10:55 PM      Mancel Bale, MD 07/17/21 2329

## 2021-07-17 NOTE — ED Notes (Signed)
In and out cath was attempted. There was resistance met and no urine return, only blood.

## 2021-07-17 NOTE — ED Provider Notes (Addendum)
Provider Note MRN:  [id number]  Arrival date & time: 07/18/21    ED Course and Medical Decision Making  Assumed care from Dr. Eulis Foster at shift change.  UTI with acute change.  Vital signs here in the emergency department, now with concern for sepsis.  Hypotension currently treating with more fluid resuscitation, will monitor closely.  Will need admission, hospitalist versus ICU bed pending on response.  On my assessment patient is ill-appearing, blood pressure in the [id number] systolic despite 4 L crystalloid.  Had a single episode of vomiting, provided Zofran.  Protecting airway.  Decision made to start norepinephrine.  Central line placed as described below.  Seems much better perfused with norepinephrine.  Will be admitted to ICU, either here or at [facility] depending on his response/pressor requirement.  4 AM update: Patient with continued vomiting episodes, continued high pressor requirement.  Intubated for airway protection as described below.  .Critical Care Performed by: Maudie Flakes, MD Authorized by: Maudie Flakes, MD   Critical care provider statement:    Critical care time (minutes):  45   Critical care was necessary to treat or prevent imminent or life-threatening deterioration of the following conditions:  Sepsis and shock   Critical care was time spent personally by me on the following activities:  Development of treatment plan with patient or surrogate, discussions with consultants, evaluation of patient's response to treatment, examination of patient, ordering and review of laboratory studies, ordering and review of radiographic studies, ordering and performing treatments and interventions, pulse oximetry, re-evaluation of patient's condition and review of old charts   I assumed direction of critical care for this patient from another provider in my specialty: yes     Care discussed with: admitting provider   .Central Line  Date/Time: 07/18/2021 12:24 AM Performed by: Maudie Flakes, MD Authorized by: Maudie Flakes, MD   Consent:    Consent obtained:  Verbal   Consent given by:  Patient   Risks, benefits, and alternatives were discussed: yes     Risks discussed:  Arterial puncture, bleeding, infection, incorrect placement, nerve damage and pneumothorax Universal protocol:    Procedure explained and questions answered to patient or proxy's satisfaction: yes     Test results available: yes     Imaging studies available: yes     Required blood products, implants, devices, and special equipment available: yes     Site/side marked: yes     Immediately prior to procedure, a time out was called: yes     Patient identity confirmed:  Verbally with patient Pre-procedure details:    Indication(s): central venous access     Hand hygiene: Hand hygiene performed prior to insertion     Sterile barrier technique: All elements of maximal sterile technique followed     Skin preparation:  Chlorhexidine   Skin preparation agent: Skin preparation agent completely dried prior to procedure   Sedation:    Sedation type:  None Anesthesia:    Anesthesia method:  Local infiltration   Local anesthetic:  Lidocaine 1% w/o epi Procedure details:    Location:  R internal jugular   Patient position:  Supine   Procedural supplies:  Triple lumen   Catheter size:  7 Fr   Landmarks identified: yes     Ultrasound guidance: yes     Ultrasound guidance timing: real time     Sterile ultrasound techniques: Sterile gel and sterile probe covers were used     Number of attempts:  1   Successful placement: yes   Post-procedure details:    Post-procedure:  Dressing applied and line sutured   Assessment:  Blood return through all ports, free fluid flow, no pneumothorax on x-ray and placement verified by x-ray   Procedure completion:  Tolerated well, no immediate complications Procedure Name: Intubation Date/Time: 07/18/2021 4:16 AM Performed by: Sabas Sous, MD Pre-anesthesia  Checklist: Patient identified, Patient being monitored, Emergency Drugs available, Timeout performed and Suction available Oxygen Delivery Method: Non-rebreather mask Preoxygenation: Pre-oxygenation with 100% oxygen Induction Type: Rapid sequence Ventilation: Mask ventilation without difficulty Laryngoscope Size: Glidescope and 4 Grade View: Grade I Tube size: 8.0 mm Number of attempts: 1 Airway Equipment and Method: Rigid stylet Placement Confirmation: ETT inserted through vocal cords under direct vision, CO2 detector and Breath sounds checked- equal and bilateral Secured at: 26 cm Tube secured with: ETT holder Comments: RSI using 20 mg etomidate and 120 mg succinylcholine.     Final Clinical Impressions(s) / ED Diagnoses     ICD-10-CM   1. Acute cystitis with hematuria  N30.01     2. Sepsis without acute organ dysfunction, due to unspecified organism (HCC)  A41.9     3. Right ureteral stone  N20.1           Elmer Sow. Pilar Plate, MD [facility] Emergency Medicine [facility] [email or web address]    Sabas Sous, MD 07/18/21 [id number]    Sabas Sous, MD 07/18/21 [phone]

## 2021-07-17 NOTE — Discharge Instructions (Addendum)
1)Avoid ibuprofen/Advil/Aleve/Motrin/Goody Powders/Naproxen/BC powders/Meloxicam/Diclofenac/Indomethacin and other Nonsteroidal anti-inflammatory medications as these will make you more likely to bleed and can cause stomach ulcers, can also cause Kidney problems.   2)Please follow-up with Urologist Dr. Ronne Binning in about 1 to 2 weeks ---Keep Foley Catheter in until seen in office  for recheck and follow-up evaluation  in his office----[facility], [address], [address], [place] [address] Phone Number----[phone]  3)follow up with primary care physician at the [facility] clinic for repeat CBC and BMP blood test in about a week or so.

## 2021-07-17 NOTE — ED Notes (Signed)
Critical value  Lactic acid 5.7 reported to edp idol at this time. See orders .

## 2021-07-17 NOTE — ED Notes (Signed)
Pt is able to drink.  

## 2021-07-17 NOTE — ED Triage Notes (Addendum)
Pt reports he had blood work done at his PCP office on Tuesday and daughter states his potassium and hgb were low and has been more weak than normal associated with decreased appetite, though has been weak for 3 months. Daughter also reports pain to his penis, recent adult circumcision 05/25/2021.

## 2021-07-17 NOTE — ED Notes (Signed)
Asking second RN for second line

## 2021-07-17 NOTE — ED Notes (Signed)
RN and family was assisting patient up for d/c. Pt was increasingly difficult to sit and stand. Pt then became lethargic. Edp to room to assess. Pt hooked back up to monitor. Iv replaced.

## 2021-07-17 NOTE — ED Notes (Addendum)
Daughter is primary caregiver of both parents, wife of pt has dementia per daughter.

## 2021-07-17 NOTE — ED Provider Notes (Signed)
[facility] EMERGENCY DEPARTMENT Provider Note   CSN: [id number] Arrival date & time: 07/17/21  0944     History Chief Complaint  Patient presents with   Weakness    Patrick Kerr is a 75 y.o. 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  The main complaint is for generalized weakness and reduced appetite for the past 3 months.  Per daughter at bedside he has lost an estimated 40 pounds in the last 3 months.  Additionally he has complaints of chronic constipation which is somewhat improved using MiraLAX and other stool softeners.  He has persistent burning penile pain both before and since his circumcision but states this symptom is improved but still pretty uncomfortable at times with this intermittent sharp/burning pain.  He was last seen by his urologist in [place] post surgery and was told there is nothing more that can be done for him.  Daughter at the bedside states they have an appointment scheduled with [facility] urology for a second opinion in January (Dr. Alyson Ingles).  He also reports episodes of burning pain at his rectum.  He had a screening colonoscopy in October, report at the bedside states he had a small polyp and some internal hemorrhoids.  He denies rectal bleeding.  Per daughter who gives the majority of patient's history he was placed on a 7-day course of doxycycline earlier in the month by the urologist to treat a "possible prostate infection" but patient denies any improvement in his symptoms.  He denies fevers or chills, nausea or vomiting.  Also denies chest pain or shortness of breath.  He has noted increasing swelling in his ankles over the past week.  He was seen by his PCP 4 days ago and blood work was revealing for abnormal labs including a potassium of 3.3 and hemoglobin of 11.8.  Other pertinent negatives include  no cough, orthopnea, palpitations, diaphoresis, headache, chest pain.  He is taking large doses of Tylenol and ibuprofen for symptom relief.  Daughter states he has cut back this week taking no more than 800 of ibuprofen twice daily and 2400 mg of Tylenol daily.  He has tried no other alleviators for his symptoms.   The history is provided by the patient and a relative (Daughter predominantly gives hx).      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  Social History   Tobacco Use   Smoking status: Never   Smokeless tobacco: Never  Vaping Use   Vaping Use: Every day  Substance Use Topics   Alcohol use: No   Drug use: No    Home Medications Prior to Admission medications   Medication Sig Start Date End Date Taking? Authorizing Provider  cephALEXin (KEFLEX) 500 MG capsule Take 1 capsule (500 mg total) by mouth 4 (four) times daily. 07/17/21  Yes Prudencio Velazco, Almyra Free, PA-C  phenazopyridine (PYRIDIUM) 200 MG tablet Take 1 tablet (200 mg total) by mouth 3 (three) times daily. 07/17/21  Yes Evalee Jefferson, PA-C  cyanocobalamin (,VITAMIN B-12,) 1000 MCG/ML injection Inject into the muscle.    [provider]  hydrochlorothiazide (HYDRODIURIL) 25 MG tablet Take by mouth.    [provider]  levothyroxine (SYNTHROID, LEVOTHROID) 137 MCG tablet Take by mouth.    [provider]  LORazepam (ATIVAN) 1 MG tablet Take by mouth.    [provider]  sertraline (ZOLOFT) 100 MG tablet Take by mouth.    [provider]    Allergies    Amitriptyline, Azithromycin, Bactrim [sulfamethoxazole-trimethoprim], Erythromycin,  Mirabegron, Prednisone, and Hydromorphone  Review of Systems   Review of Systems  Constitutional:  Positive for appetite change and fatigue. Negative for fever.  HENT: Negative.    Eyes: Negative.   Respiratory:  Negative for chest tightness and shortness of breath.   Cardiovascular:  Positive for leg swelling. Negative for chest pain and palpitations.  Gastrointestinal:  Positive for constipation and rectal pain. Negative for abdominal pain, blood in stool, nausea and vomiting.  Genitourinary:  Positive for penile pain.  Musculoskeletal:  Negative for myalgias.  Skin: Negative.  Negative for rash and wound.  Neurological:  Positive for weakness. Negative for dizziness, light-headedness, numbness and headaches.  Psychiatric/Behavioral: Negative.    All other systems reviewed and are negative.  Physical Exam Updated Vital Signs BP (!) 106/57    Pulse 98    Temp 98.4 F (36.9 C) (Oral)    Resp 17    Ht 6' 4" (1.93 m)    Wt 127 kg    SpO2 96%    BMI 34.08 kg/m   Physical Exam Vitals and nursing note reviewed.  Constitutional:      Appearance: He is well-developed.  HENT:     Head: Normocephalic and atraumatic.     Mouth/Throat:     Mouth: Mucous membranes are dry.  Eyes:     Conjunctiva/sclera: Conjunctivae normal.  Cardiovascular:     Rate and Rhythm: Normal rate and regular rhythm.     Heart sounds: Normal heart sounds.  Pulmonary:     Effort: Pulmonary effort is normal.     Breath sounds: Normal breath sounds. No wheezing.  Abdominal:     General: Abdomen is protuberant. Bowel sounds are normal.     Palpations: Abdomen is soft.     Tenderness: There is no abdominal tenderness. There is no guarding.     Comments: Pt refuses rectal exam  Musculoskeletal:        General: Normal range of motion.     Cervical back: Normal range of motion.  Skin:    General: Skin is warm and dry.  Neurological:     Mental Status: He is alert.    ED Results / Procedures / Treatments    Labs (all labs ordered are listed, but only abnormal results are displayed) Labs Reviewed  URINALYSIS, ROUTINE W REFLEX MICROSCOPIC - Abnormal; Notable for the following components:      Result Value   Color, Urine AMBER (*)    APPearance CLOUDY (*)    Specific Gravity, Urine >1.046 (*)    Hgb urine dipstick LARGE (*)    Ketones, ur 5 (*)    Protein, ur 30 (*)    Nitrite POSITIVE (*)    Leukocytes,Ua LARGE (*)    RBC / HPF >50 (*)    WBC, UA >50 (*)    Bacteria, UA MANY (*)    Non Squamous Epithelial 0-5 (*)    All other components within normal limits  COMPREHENSIVE METABOLIC  PANEL - Abnormal; Notable for the following components:   Potassium 3.0 (*)    Glucose, Bld 112 (*)    All other components within normal limits  CBC WITH DIFFERENTIAL/PLATELET - Abnormal; Notable for the following components:   RBC 3.31 (*)    Hemoglobin 11.5 (*)    HCT 34.7 (*)    MCV 104.8 (*)    MCH 34.7 (*)    Platelets 140 (*)    All other components within normal limits  TSH - Abnormal; Notable for the following components:   TSH 0.053 (*)    All other components within normal limits  CBG MONITORING, ED - Abnormal; Notable for the following components:   Glucose-Capillary 107 (*)    All other components within normal limits  CBG MONITORING, ED - Abnormal; Notable for the following components:   Glucose-Capillary 127 (*)    All other components within normal limits  URINE CULTURE  CULTURE, BLOOD (ROUTINE X 2)  CULTURE, BLOOD (ROUTINE X 2)  LACTIC ACID, PLASMA    EKG EKG Interpretation  Date/Time:  Saturday July 17 2021 09:58:37 EST Ventricular Rate:  70 PR Interval:  166 QRS Duration: 173 QT Interval:  429 QTC Calculation: 463 R Axis:   -46 Text Interpretation: Sinus rhythm RBBB and LAFB Baseline wander in lead(s) V5 Similar to Sep 2022 tracing Confirmed by Nanda Quinton [phone]) on 07/17/2021 10:01:04 AM  Radiology CT ABDOMEN PELVIS W CONTRAST  Result Date:  07/17/2021 CLINICAL DATA:  Chronic rectal and penile pain. Weight loss and fatigue. EXAM: CT ABDOMEN AND PELVIS WITH CONTRAST TECHNIQUE: Multidetector CT imaging of the abdomen and pelvis was performed using the standard protocol following bolus administration of intravenous contrast. CONTRAST:  181m OMNIPAQUE IOHEXOL 300 MG/ML  SOLN COMPARISON:  04/23/2021 FINDINGS: Lower chest: No acute abnormality. Hepatobiliary: Liver normal in size. Subcentimeter low-density lesion, anterior aspect of the lateral segment of the left lobe, consistent with a cyst. No other liver masses or lesions. Normal gallbladder. No bile duct dilation. Pancreas: Unremarkable. No pancreatic ductal dilatation or surrounding inflammatory changes. Spleen: Normal in size without focal abnormality. Adrenals/Urinary Tract: Small adrenal nodules, both 11 mm, stable. Kidneys normal in overall size, orientation and position. No renal masses. Small bilateral intrarenal stones. No hydronephrosis. Stones noted in the distal right ureter at and just above the ureterovesicular junction, stable from the prior CT. This causes no signs of obstruction. Normal left ureter. Bladder wall is irregular with several cellule. No bladder mass or stone. Stomach/Bowel: Stomach is unremarkable. Small bowel and colon are normal in caliber. No wall thickening. No inflammation. Normal appendix visualized. Vascular/Lymphatic: Aortic atherosclerosis. No aneurysm. Scattered subcentimeter retroperitoneal lymph nodes. No enlarged lymph nodes. Reproductive: Findings consistent with a previous TURP. Densities within the prostate consistent with radiation therapy seeds. These findings are stable. Other: No abdominal wall hernia or abnormality. No abdominopelvic ascites. Musculoskeletal: No fracture or acute finding. No aggressive bone lesion. Previous posterior lumbar spine fusion, L3 through L5. Advanced degenerative changes throughout the visualized spine. IMPRESSION: 1. No  acute findings within the abdomen or pelvis. 2. Stones in the distal right ureter without findings of obstruction, and unchanged compared to the prior CT. Small nonobstructing intrarenal stones. 3. 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 4. Aortic atherosclerosis. Electronically Signed   By: DLajean ManesM.D.   On: 07/17/2021 13:02   DG ABD ACUTE 2+V W 1V CHEST  Result Date: 07/17/2021 CLINICAL DATA:  Weakness, constipation. EXAM: DG ABDOMEN ACUTE  WITH 1 VIEW CHEST COMPARISON:  CT abdomen pelvis 04/23/2021 FINDINGS: There is no evidence of dilated bowel loops or free intraperitoneal air. Thin linear radiopaque calculi projecting in the medial left abdomen correspond to vascular calcifications seen on prior CT 04/23/2021. Surgical clips project at the low central pelvis from prior TURP. Bilateral pedicle screw and rod fixation spans L3-L5 with intervertebral spacers. Severe degenerative disc disease at L1-L2 and L2-L3. Postoperative changes of median sternotomy. Heart size and mediastinal contours are within normal limits. No focal consolidation, pleural effusion, or pneumothorax. Subsegmental bibasilar atelectasis. IMPRESSION: Nonobstructive bowel gas pattern on abdominal radiographs. No acute cardiopulmonary abnormality. Electronically Signed   By: Ileana Roup M.D.   On: 07/17/2021 11:35    Procedures Procedures   Medications Ordered in ED Medications  sodium chloride 0.9 % bolus 1,000 mL (has no administration in time range)  potassium chloride SA (KLOR-CON M) CR tablet 40 mEq (40 mEq Oral Given 07/17/21 1156)  iohexol (OMNIPAQUE) 300 MG/ML solution 100 mL (100 mLs Intravenous Contrast Given 07/17/21 1232)  sodium chloride 0.9 % bolus 500 mL (0 mLs Intravenous Stopped 07/17/21 1548)  cefTRIAXone (ROCEPHIN) 1 g in sodium chloride 0.9 % 100 mL IVPB (1 g Intravenous New Bag/Given 07/17/21 1656)  phenazopyridine (PYRIDIUM) tablet 200 mg (200 mg Oral  Given 07/17/21 1751)    ED Course  I have reviewed the triage vital signs and the nursing notes.  Pertinent labs & imaging results that were available during my care of the patient were reviewed by me and considered in my medical decision making (see chart for details).  Clinical Course as of 07/17/21 1827  Sat Jul 17, 2021  1638 Urine finally obtained after giving gentle fluid bolus. Pt was able to urinate spontaneously.  UTI present, concerning given chronic ureteral stone.   Call placed to urology for consult.  In the interim, pt was given IV rocephin.  Urine cx sent.  [JI]    Clinical Course User Index [JI] Landis Martins   MDM Rules/Calculators/A&P                         Ureteral stone per outside records initially noted per CT scan dated 11/11/19.    Pt with chronic right ureteral stone with uti present.  Discussed with Dr Abner Greenspan of urology.  Since pt is afebrile and there is no evidence of obstruction, no indication for intervention at this time, but would have pt complete full course of abx.  Rocephin given here,  keflex qid x 10 days prescribed.  Ambulatory referral sent for urology for hopeful quicker outpatient evaluation.    Pt was also given prescription for azo, first dose here for pain relief.  6:27 PM  Patient was planned for discharge home, close follow-up with urology.  At the time of discharge he started to become tremulous, attempts to get him up and dressed met with a near syncopal episode.  Recheck of vital signs included a blood pressure of 106/57, pulse is 98, and oral temperature is now 101.8, has been afebrile prior to this point.  Patient will require admission additional labs including a lactate, blood cultures have been ordered, additional IV fluids have also been ordered.  Pt is now receiving additional IV fluids, 2 IV's started for total of 30cc/kg.  Lactate elevated.  Was in conversation with Dr. Nehemiah Settle with the hospitalist service when I was initially  notified of his hypotension. Will defer admission at this time until  pt is stablized.    Discussed patient with Dr. Eulis Foster who assumes care.       Final Clinical Impression(s) / ED Diagnoses Final diagnoses:  Acute cystitis with hematuria  Sepsis without acute organ dysfunction, due to unspecified organism [facility])    Rx / DC Orders ED Discharge Orders          Ordered    Ambulatory referral to Urology       Comments: Distal ureteral stone x 3 months   07/17/21 1346    cephALEXin (KEFLEX) 500 MG capsule  4 times daily        07/17/21 1736    phenazopyridine (PYRIDIUM) 200 MG tablet  3 times daily        07/17/21 1742             Evalee Jefferson, Hershal Coria 07/17/21 1946    Long, Wonda Olds, MD 07/18/21 [phone]

## 2021-07-18 ENCOUNTER — Inpatient Hospital Stay (HOSPITAL_COMMUNITY): Payer: No Typology Code available for payment source

## 2021-07-18 ENCOUNTER — Emergency Department (HOSPITAL_COMMUNITY): Payer: No Typology Code available for payment source

## 2021-07-18 ENCOUNTER — Encounter (HOSPITAL_COMMUNITY): Payer: Self-pay | Admitting: Internal Medicine

## 2021-07-18 DIAGNOSIS — F17290 Nicotine dependence, other tobacco product, uncomplicated: Secondary | ICD-10-CM | POA: Diagnosis present

## 2021-07-18 DIAGNOSIS — N201 Calculus of ureter: Secondary | ICD-10-CM | POA: Diagnosis present

## 2021-07-18 DIAGNOSIS — N3001 Acute cystitis with hematuria: Secondary | ICD-10-CM | POA: Diagnosis present

## 2021-07-18 DIAGNOSIS — E669 Obesity, unspecified: Secondary | ICD-10-CM | POA: Diagnosis present

## 2021-07-18 DIAGNOSIS — B001 Herpesviral vesicular dermatitis: Secondary | ICD-10-CM | POA: Diagnosis not present

## 2021-07-18 DIAGNOSIS — B961 Klebsiella pneumoniae [K. pneumoniae] as the cause of diseases classified elsewhere: Secondary | ICD-10-CM | POA: Diagnosis present

## 2021-07-18 DIAGNOSIS — E872 Acidosis, unspecified: Secondary | ICD-10-CM | POA: Diagnosis present

## 2021-07-18 DIAGNOSIS — E039 Hypothyroidism, unspecified: Secondary | ICD-10-CM

## 2021-07-18 DIAGNOSIS — N390 Urinary tract infection, site not specified: Secondary | ICD-10-CM | POA: Diagnosis not present

## 2021-07-18 DIAGNOSIS — N320 Bladder-neck obstruction: Secondary | ICD-10-CM | POA: Diagnosis present

## 2021-07-18 DIAGNOSIS — F32A Depression, unspecified: Secondary | ICD-10-CM | POA: Diagnosis present

## 2021-07-18 DIAGNOSIS — N401 Enlarged prostate with lower urinary tract symptoms: Secondary | ICD-10-CM | POA: Diagnosis present

## 2021-07-18 DIAGNOSIS — I700 Atherosclerosis of aorta: Secondary | ICD-10-CM | POA: Diagnosis present

## 2021-07-18 DIAGNOSIS — A4159 Other Gram-negative sepsis: Secondary | ICD-10-CM | POA: Diagnosis present

## 2021-07-18 DIAGNOSIS — D696 Thrombocytopenia, unspecified: Secondary | ICD-10-CM

## 2021-07-18 DIAGNOSIS — Z01818 Encounter for other preprocedural examination: Secondary | ICD-10-CM | POA: Diagnosis present

## 2021-07-18 DIAGNOSIS — A419 Sepsis, unspecified organism: Secondary | ICD-10-CM | POA: Diagnosis not present

## 2021-07-18 DIAGNOSIS — E66811 Obesity, class 1: Secondary | ICD-10-CM | POA: Diagnosis present

## 2021-07-18 DIAGNOSIS — R7989 Other specified abnormal findings of blood chemistry: Secondary | ICD-10-CM | POA: Diagnosis present

## 2021-07-18 DIAGNOSIS — R6510 Systemic inflammatory response syndrome (SIRS) of non-infectious origin without acute organ dysfunction: Secondary | ICD-10-CM | POA: Diagnosis not present

## 2021-07-18 DIAGNOSIS — N179 Acute kidney failure, unspecified: Secondary | ICD-10-CM | POA: Diagnosis present

## 2021-07-18 DIAGNOSIS — A498 Other bacterial infections of unspecified site: Secondary | ICD-10-CM | POA: Diagnosis not present

## 2021-07-18 DIAGNOSIS — G9341 Metabolic encephalopathy: Secondary | ICD-10-CM | POA: Diagnosis present

## 2021-07-18 DIAGNOSIS — I10 Essential (primary) hypertension: Secondary | ICD-10-CM | POA: Diagnosis present

## 2021-07-18 DIAGNOSIS — D539 Nutritional anemia, unspecified: Secondary | ICD-10-CM | POA: Diagnosis present

## 2021-07-18 DIAGNOSIS — N138 Other obstructive and reflux uropathy: Secondary | ICD-10-CM | POA: Diagnosis present

## 2021-07-18 DIAGNOSIS — L89152 Pressure ulcer of sacral region, stage 2: Secondary | ICD-10-CM | POA: Diagnosis not present

## 2021-07-18 DIAGNOSIS — J9601 Acute respiratory failure with hypoxia: Secondary | ICD-10-CM | POA: Diagnosis present

## 2021-07-18 DIAGNOSIS — Z20822 Contact with and (suspected) exposure to covid-19: Secondary | ICD-10-CM | POA: Diagnosis present

## 2021-07-18 DIAGNOSIS — E876 Hypokalemia: Secondary | ICD-10-CM

## 2021-07-18 DIAGNOSIS — R6521 Severe sepsis with septic shock: Secondary | ICD-10-CM | POA: Diagnosis present

## 2021-07-18 LAB — COMPREHENSIVE METABOLIC PANEL
ALT: 45 U/L — ABNORMAL HIGH (ref 0–44)
AST: 97 U/L — ABNORMAL HIGH (ref 15–41)
Albumin: 3 g/dL — ABNORMAL LOW (ref 3.5–5.0)
Alkaline Phosphatase: 63 U/L (ref 38–126)
Anion gap: 12 (ref 5–15)
BUN: 17 mg/dL (ref 8–23)
CO2: 18 mmol/L — ABNORMAL LOW (ref 22–32)
Calcium: 8.3 mg/dL — ABNORMAL LOW (ref 8.9–10.3)
Chloride: 107 mmol/L (ref 98–111)
Creatinine, Ser: 1.35 mg/dL — ABNORMAL HIGH (ref 0.61–1.24)
GFR, Estimated: 54 mL/min — ABNORMAL LOW (ref >60)
Glucose, Bld: 196 mg/dL — ABNORMAL HIGH (ref 70–99)
Potassium: 2.4 mmol/L — CL (ref 3.5–5.1)
Sodium: 137 mmol/L (ref 135–145)
Total Bilirubin: 1.7 mg/dL — ABNORMAL HIGH (ref 0.3–1.2)
Total Protein: 5.9 g/dL — ABNORMAL LOW (ref 6.5–8.1)

## 2021-07-18 LAB — GLUCOSE, CAPILLARY
Glucose-Capillary: 175 mg/dL — ABNORMAL HIGH (ref 70–99)
Glucose-Capillary: 209 mg/dL — ABNORMAL HIGH (ref 70–99)
Glucose-Capillary: 225 mg/dL — ABNORMAL HIGH (ref 70–99)
Glucose-Capillary: 226 mg/dL — ABNORMAL HIGH (ref 70–99)
Glucose-Capillary: 245 mg/dL — ABNORMAL HIGH (ref 70–99)

## 2021-07-18 LAB — BLOOD GAS, ARTERIAL
Acid-base deficit: 5.8 mmol/L — ABNORMAL HIGH (ref 0.0–2.0)
Bicarbonate: 19.9 mmol/L — ABNORMAL LOW (ref 20.0–28.0)
FIO2: 80
O2 Saturation: 95.3 %
Patient temperature: 37
pCO2 arterial: 32.8 mmHg (ref 32.0–48.0)
pH, Arterial: 7.369 (ref 7.350–7.450)
pO2, Arterial: 90.5 mmHg (ref 83.0–108.0)

## 2021-07-18 LAB — CBC
HCT: 34.5 % — ABNORMAL LOW (ref 39.0–52.0)
Hemoglobin: 11.5 g/dL — ABNORMAL LOW (ref 13.0–17.0)
MCH: 35.5 pg — ABNORMAL HIGH (ref 26.0–34.0)
MCHC: 33.3 g/dL (ref 30.0–36.0)
MCV: 106.5 fL — ABNORMAL HIGH (ref 80.0–100.0)
Platelets: 124 10*3/uL — ABNORMAL LOW (ref 150–400)
RBC: 3.24 MIL/uL — ABNORMAL LOW (ref 4.22–5.81)
RDW: 12.3 % (ref 11.5–15.5)
WBC: 12.5 10*3/uL — ABNORMAL HIGH (ref 4.0–10.5)
nRBC: 0 % (ref 0.0–0.2)

## 2021-07-18 LAB — LACTIC ACID, PLASMA
Lactic Acid, Venous: 2.9 mmol/L (ref 0.5–1.9)
Lactic Acid, Venous: 2.7 mmol/L (ref 0.5–1.9)
Lactic Acid, Venous: 3.1 mmol/L (ref 0.5–1.9)
Lactic Acid, Venous: 3.3 mmol/L (ref 0.5–1.9)
Lactic Acid, Venous: 3.4 mmol/L (ref 0.5–1.9)

## 2021-07-18 LAB — PROCALCITONIN: Procalcitonin: 55.58 ng/mL

## 2021-07-18 LAB — MAGNESIUM: Magnesium: 1.5 mg/dL — ABNORMAL LOW (ref 1.7–2.4)

## 2021-07-18 LAB — MRSA NEXT GEN BY PCR, NASAL: MRSA by PCR Next Gen: NOT DETECTED

## 2021-07-18 LAB — FOLATE: Folate: 5.1 ng/mL — ABNORMAL LOW (ref >5.9)

## 2021-07-18 LAB — VITAMIN B12: Vitamin B-12: 415 pg/mL (ref 180–914)

## 2021-07-18 LAB — PHOSPHORUS: Phosphorus: 1.6 mg/dL — ABNORMAL LOW (ref 2.5–4.6)

## 2021-07-18 LAB — CORTISOL: Cortisol, Plasma: 29.7 ug/dL

## 2021-07-18 LAB — APTT: aPTT: 34 seconds (ref 24–36)

## 2021-07-18 MED ORDER — CHLORHEXIDINE GLUCONATE 0.12% ORAL RINSE (MEDLINE KIT)
15.0000 mL | Freq: Two times a day (BID) | OROMUCOSAL | Status: DC
  Administered 2021-07-18 – 2021-07-19 (×3): 15 mL via OROMUCOSAL

## 2021-07-18 MED ORDER — MAGNESIUM SULFATE 2 GM/50ML IV SOLN
2.0000 g | Freq: Once | INTRAVENOUS | Status: AC
  Administered 2021-07-18: 08:00:00 2 g via INTRAVENOUS
  Filled 2021-07-18: qty 50

## 2021-07-18 MED ORDER — POTASSIUM & SODIUM PHOSPHATES 280-160-250 MG PO PACK
1.0000 | PACK | Freq: Three times a day (TID) | ORAL | Status: DC
Start: 2021-07-18 — End: 2021-07-23
  Administered 2021-07-18 – 2021-07-23 (×8): 1 via NASOGASTRIC
  Filled 2021-07-18 (×27): qty 1

## 2021-07-18 MED ORDER — LEVOTHYROXINE SODIUM 100 MCG/5ML IV SOLN: 75.0000 ug | Freq: Every day | INTRAVENOUS | Status: DC

## 2021-07-18 MED ORDER — SODIUM CHLORIDE 0.9 % IV SOLN: Freq: Once | INTRAVENOUS | Status: AC

## 2021-07-18 MED ORDER — DEXMEDETOMIDINE HCL IN NACL 200 MCG/50ML IV SOLN
0.4000 ug/kg/h | INTRAVENOUS | Status: DC
  Administered 2021-07-18: 06:00:00 1.2 ug/kg/h via INTRAVENOUS
  Administered 2021-07-18: 05:00:00 0.8 ug/kg/h via INTRAVENOUS
  Filled 2021-07-18: qty 50

## 2021-07-18 MED ORDER — ORAL CARE MOUTH RINSE
15.0000 mL | OROMUCOSAL | Status: DC
  Administered 2021-07-18 – 2021-07-19 (×13): 15 mL via OROMUCOSAL

## 2021-07-18 MED ORDER — PANTOPRAZOLE SODIUM 40 MG IV SOLR
40.0000 mg | INTRAVENOUS | Status: DC
  Administered 2021-07-18 – 2021-07-19 (×2): 40 mg via INTRAVENOUS
  Filled 2021-07-18 (×2): qty 40

## 2021-07-18 MED ORDER — LORAZEPAM 2 MG/ML IJ SOLN
0.5000 mg | INTRAMUSCULAR | Status: AC
  Administered 2021-07-18 (×2): 0.5 mg via INTRAVENOUS
  Filled 2021-07-18: qty 1

## 2021-07-18 MED ORDER — POTASSIUM CHLORIDE 10 MEQ/100ML IV SOLN
10.0000 meq | INTRAVENOUS | Status: AC
  Administered 2021-07-18 (×6): 10 meq via INTRAVENOUS
  Filled 2021-07-18 (×6): qty 100

## 2021-07-18 MED ORDER — LACTATED RINGERS IV SOLN: INTRAVENOUS | Status: DC

## 2021-07-18 MED ORDER — CHLORHEXIDINE GLUCONATE CLOTH 2 % EX PADS
6.0000 | MEDICATED_PAD | Freq: Every day | CUTANEOUS | Status: DC
  Administered 2021-07-18 – 2021-07-29 (×12): 6 via TOPICAL

## 2021-07-18 MED ORDER — ENOXAPARIN SODIUM 40 MG/0.4ML IJ SOSY
40.0000 mg | PREFILLED_SYRINGE | INTRAMUSCULAR | Status: DC
  Administered 2021-07-18 – 2021-07-19 (×2): 40 mg via SUBCUTANEOUS
  Filled 2021-07-18 (×2): qty 0.4

## 2021-07-18 MED ORDER — SODIUM CHLORIDE 0.9 % IV SOLN
2.0000 g | INTRAVENOUS | Status: DC
  Administered 2021-07-18: 08:00:00 2 g via INTRAVENOUS
  Filled 2021-07-18: qty 20

## 2021-07-18 MED ORDER — POTASSIUM PHOSPHATES 15 MMOLE/5ML IV SOLN
30.0000 mmol | Freq: Once | INTRAVENOUS | Status: AC
  Administered 2021-07-18: 10:00:00 30 mmol via INTRAVENOUS
  Filled 2021-07-18: qty 10

## 2021-07-18 MED ORDER — FENTANYL 2500MCG IN NS 250ML (10MCG/ML) PREMIX INFUSION
0.0000 ug/h | INTRAVENOUS | Status: DC
  Administered 2021-07-18: 23:00:00 250 ug/h via INTRAVENOUS
  Administered 2021-07-18: 05:00:00 25 ug/h via INTRAVENOUS
  Filled 2021-07-18 (×2): qty 250

## 2021-07-18 MED ORDER — LEVOTHYROXINE SODIUM 100 MCG/5ML IV SOLN: 68.5000 ug | Freq: Every day | INTRAVENOUS | Status: DC

## 2021-07-18 MED ORDER — METHYLPREDNISOLONE SODIUM SUCC 125 MG IJ SOLR
125.0000 mg | Freq: Once | INTRAMUSCULAR | Status: AC
  Administered 2021-07-18: 04:00:00 125 mg via INTRAVENOUS
  Filled 2021-07-18: qty 2

## 2021-07-18 MED ORDER — SUCCINYLCHOLINE CHLORIDE 200 MG/10ML IV SOSY
PREFILLED_SYRINGE | INTRAVENOUS | Status: AC
  Administered 2021-07-18: 04:00:00 120 mg via INTRAVENOUS
  Filled 2021-07-18: qty 10

## 2021-07-18 MED ORDER — DEXMEDETOMIDINE HCL IN NACL 400 MCG/100ML IV SOLN
0.4000 ug/kg/h | INTRAVENOUS | Status: DC
  Administered 2021-07-18 – 2021-07-19 (×8): 1.2 ug/kg/h via INTRAVENOUS
  Filled 2021-07-18 (×8): qty 100

## 2021-07-18 MED ORDER — SODIUM CHLORIDE 0.9 % IV SOLN
2.0000 g | INTRAVENOUS | Status: DC
  Administered 2021-07-19 – 2021-07-23 (×5): 2 g via INTRAVENOUS
  Filled 2021-07-18 (×5): qty 20

## 2021-07-18 MED ORDER — ETOMIDATE 2 MG/ML IV SOLN
INTRAVENOUS | Status: AC
  Administered 2021-07-18: 04:00:00 20 mg
  Filled 2021-07-18: qty 20

## 2021-07-18 MED ORDER — ACETAMINOPHEN 650 MG RE SUPP
650.0000 mg | Freq: Four times a day (QID) | RECTAL | Status: DC | PRN
  Filled 2021-07-18: qty 1

## 2021-07-18 NOTE — Plan of Care (Signed)
°  Problem: Education: Goal: Knowledge of General Education information will improve Description: Including pain rating scale, medication(s)/side effects and non-pharmacologic comfort measures 07/18/2021 1925 by Corrie Mckusick, RN Outcome: Progressing 07/18/2021 0623 by Corrie Mckusick, RN Outcome: Progressing   Problem: Health Behavior/Discharge Planning: Goal: Ability to manage health-related needs will improve 07/18/2021 1925 by Corrie Mckusick, RN Outcome: Progressing 07/18/2021 0623 by Corrie Mckusick, RN Outcome: Progressing   Problem: Clinical Measurements: Goal: Ability to maintain clinical measurements within normal limits will improve 07/18/2021 1925 by Corrie Mckusick, RN Outcome: Progressing 07/18/2021 0623 by Corrie Mckusick, RN Outcome: Progressing Goal: Will remain free from infection 07/18/2021 1925 by Corrie Mckusick, RN Outcome: Progressing 07/18/2021 0623 by Corrie Mckusick, RN Outcome: Progressing Goal: Diagnostic test results will improve 07/18/2021 1925 by Corrie Mckusick, RN Outcome: Progressing 07/18/2021 0623 by Corrie Mckusick, RN Outcome: Progressing Goal: Respiratory complications will improve 07/18/2021 1925 by Corrie Mckusick, RN Outcome: Progressing 07/18/2021 0623 by Corrie Mckusick, RN Outcome: Progressing Goal: Cardiovascular complication will be avoided 07/18/2021 1925 by Corrie Mckusick, RN Outcome: Progressing 07/18/2021 0623 by Corrie Mckusick, RN Outcome: Progressing   Problem: Activity: Goal: Risk for activity intolerance will decrease 07/18/2021 1925 by Corrie Mckusick, RN Outcome: Progressing 07/18/2021 0623 by Corrie Mckusick, RN Outcome: Progressing   Problem: Nutrition: Goal: Adequate nutrition will be maintained 07/18/2021 1925 by Corrie Mckusick, RN Outcome: Progressing 07/18/2021 0623 by Corrie Mckusick, RN Outcome: Progressing   Problem: Coping: Goal: Level of anxiety will  decrease 07/18/2021 1925 by Corrie Mckusick, RN Outcome: Progressing 07/18/2021 0623 by Corrie Mckusick, RN Outcome: Progressing   Problem: Elimination: Goal: Will not experience complications related to bowel motility 07/18/2021 1925 by Corrie Mckusick, RN Outcome: Progressing 07/18/2021 0623 by Corrie Mckusick, RN Outcome: Progressing Goal: Will not experience complications related to urinary retention 07/18/2021 1925 by Corrie Mckusick, RN Outcome: Progressing 07/18/2021 0623 by Corrie Mckusick, RN Outcome: Progressing   Problem: Pain Managment: Goal: General experience of comfort will improve 07/18/2021 1925 by Corrie Mckusick, RN Outcome: Progressing 07/18/2021 0623 by Corrie Mckusick, RN Outcome: Progressing   Problem: Safety: Goal: Ability to remain free from injury will improve 07/18/2021 1925 by Corrie Mckusick, RN Outcome: Progressing 07/18/2021 0623 by Corrie Mckusick, RN Outcome: Progressing   Problem: Skin Integrity: Goal: Risk for impaired skin integrity will decrease 07/18/2021 1925 by Corrie Mckusick, RN Outcome: Progressing 07/18/2021 0623 by Corrie Mckusick, RN Outcome: Progressing

## 2021-07-18 NOTE — ED Notes (Signed)
Patient in er on NRB mask at 10 liters , bag is inflated saturation 96 , patient is comfortable.

## 2021-07-18 NOTE — H&P (Signed)
History and Physical  Patrick Kerr:[id number] DOB: 1945/09/03 DOA: 07/17/2021  Referring physician: Sabas Sous, MD PCP: [facility]  Patient coming from: Home  Chief Complaint: Generalized weakness and decreased appetite  HPI: Patrick Kerr is a 75 y.o. male with medical history significant for hypertension, BPH with history of TURP, hypothyroidism who presents to the emergency department accompanied by daughter due to weakness.  Patient was unable to provide history, history was obtained from ED physician and daughter at bedside.  Per report, patient recently had circumcision in October 2022 due to recent development of phimosis, patient did complain of burning penile sensation prior to and after circumcision.  Daughter states that patient also complained of worsening burning sensation in the rectum when he has a bowel movement.  Outside screening colonoscopy done in October at bedside showed small polyp and some internal hemorrhoids, but patient denies any rectal bleeding.  He was prescribed with 7-day course of doxycycline which was followed with Macrobid for possible prostate infection, but there was no improvement in symptoms. Foley catheter was placed on 11/4 and this was removed on 12/5. Per daughter, PCP was seen about 4 days ago and blood work done was reported to be positive for hypokalemia and anemia.  Patient continued to show some weakness, so daughter decided to take patient to the ED for further evaluation and management.  There was no report of fever, chest pain, shortness of breath, headache  ED Course:  In the emergency department, he was febrile, tachypneic, tachycardia, BP was 73/55.  Work-up in the ED showed macrocytic anemia, thrombocytopenia, hypokalemia, urinalysis was positive for UTI, lactic acid 5.7 > 4.4 > 2.9.  TSH 0.053.  Influenza A, B, SARS coronavirus 2 was negative. CT abdomen and pelvis showed no acute findings within the abdomen or  pelvis Abdominal x-ray showed no acute findings within the abdominal pelvis Chest x-ray showed right-sided venous catheter positioning Hydration per sepsis protocol was provided, IV ceftriaxone was given, patient was on responsive to IV fluid, right-sided ICA central line was placed and patient was started on Levophed. PCCM at [facility] was consulted for possible transfer, but ED physician stated that he was told that patient could be managed here at [facility] and especially since there is no bed at [facility]. Patient complained of discomfort with tendency to urinate, patient has a chronic history of bladder outlet obstruction, bladder scan was done was noted to have a retention of 799 mL of urine.  In and out catheter was done in the afternoon (per RN) which resulted in bleeding (possibly traumatic catheterization).  Patient lost balance when attempt was made to transfer him from bed to bedside commode, this was followed with vomiting and worsening hypotensive episode.  It was then decided to intubate patient for protection of his airway.  He was started on IV Precedex and fentanyl drip for sedation. Hospitalist was asked to admit patient for further evaluation and management.  Review of Systems: This cannot be done at this time due to patient being intubated and sedated.   Past Medical History:  Diagnosis Date   Arthritis    Hypertension    Past Surgical History:  Procedure Laterality Date   CIRCUMCISION  05/25/2021   TRANSURETHRAL RESECTION OF PROSTATE      Social History:  reports that he has never smoked. He has never used smokeless tobacco. He reports that he does not drink alcohol and does not use drugs.   Allergies  Allergen Reactions  Amitriptyline Other (See Comments)    Elavil makes him "hyper".   Azithromycin Other (See Comments)    Causes ulcers   Bactrim [Sulfamethoxazole-Trimethoprim] Other (See Comments)   Erythromycin Other (See Comments)    Pt states gave him an ulcer    Mirabegron Other (See Comments)    Headache   Prednisone Itching and Other (See Comments)    Numbness, pt states feet felt like they were on fire   Hydromorphone Itching    History reviewed. No pertinent family history.   Prior to Admission medications   Medication Sig Start Date End Date Taking? Authorizing Provider  Acetaminophen 325 MG CAPS Take 2 capsules by mouth 2 (two) times daily.   Yes [provider]  ARIPiprazole (ABILIFY) 5 MG tablet Take 5 mg by mouth at bedtime. 05/19/21  Yes [provider]  cephALEXin (KEFLEX) 500 MG capsule Take 1 capsule (500 mg total) by mouth 4 (four) times daily. 07/17/21  Yes Idol, Raynelle Fanning, PA-C  cyanocobalamin (,VITAMIN B-12,) 1000 MCG/ML injection Inject into the muscle.   Yes [provider]  Docusate Sodium (DSS) 100 MG CAPS Take 1 capsule by mouth 2 (two) times daily. 01/09/13  Yes [provider]  Ergocalciferol 50 MCG (2000 UT) TABS Take 1 tablet by mouth daily.   Yes [provider]  ibuprofen (ADVIL) 800 MG tablet Take by mouth.   Yes [provider]  levothyroxine (SYNTHROID, LEVOTHROID) 137 MCG tablet Take 137 mcg by mouth daily before breakfast.   Yes [provider]  LORazepam (ATIVAN) 1 MG tablet Take 1 mg by mouth See admin instructions. Take 1/2 in the morning and 1/2 every evening can take up to 2 tablets   Yes [provider]  phenazopyridine (PYRIDIUM) 200 MG tablet Take 1 tablet (200 mg total) by mouth 3 (three) times daily. 07/17/21  Yes Idol, Raynelle Fanning, PA-C  polyethylene glycol powder (GLYCOLAX/MIRALAX) 17 GM/SCOOP powder Mix 1 scoopful in 4 to 8 ounces of liquid and drink every day 02/05/21  Yes [provider]  sertraline (ZOLOFT) 100 MG tablet Take 200 mg by mouth daily.   Yes [provider]  tamsulosin (FLOMAX) 0.4 MG CAPS capsule Take 0.4 mg by mouth daily.   Yes [provider]  doxycycline (VIBRA-TABS) 100 MG tablet Take 100 mg by  mouth 2 (two) times daily. Patient not taking: Reported on 07/17/2021 06/09/21   [provider]  hydrochlorothiazide (HYDRODIURIL) 25 MG tablet Take by mouth. Patient not taking: Reported on 07/17/2021    [provider]    Physical Exam: BP 120/62    Pulse (!) 102    Temp 98.1 F (36.7 C) (Axillary)    Resp (!) 24    Ht 6\' 4"  (1.93 m)    Wt 123.9 kg    SpO2 98%    BMI 33.25 kg/m   General: 74 y.o. year-old male well developed well nourished in no acute distress.  Alert and oriented x3. HEENT: NCAT, EOMI Neck: Supple, trachea medial Cardiovascular: Regular rate and rhythm with no rubs or gallops.  No thyromegaly or JVD noted.  No lower extremity edema. 2/4 pulses in all 4 extremities. Respiratory: Clear to auscultation with no wheezes or rales. Good inspiratory effort. Abdomen: Soft, nontender nondistended with normal bowel sounds x4 quadrants. Muskuloskeletal: No cyanosis, clubbing or edema noted bilaterally Neuro: CN II-XII intact, strength 5/5 x 4, sensation, reflexes intact Skin: No ulcerative lesions noted or rashes Psychiatry: Judgement and insight appear normal. Mood is appropriate  for condition and setting          Labs on Admission:  Basic Metabolic Panel: Recent Labs  Lab 07/17/21 1032 07/17/21 1834 07/18/21 0550  NA 138  --  137  K 3.0*  --  2.4*  CL 103  --  107  CO2 26  --  18*  GLUCOSE 112*  --  196*  BUN 17  --  17  CREATININE 0.95  --  1.35*  CALCIUM 8.9  --  8.3*  MG  --  1.2* 1.5*  PHOS  --   --  1.6*   Liver Function Tests: Recent Labs  Lab 07/17/21 1032 07/18/21 0550  AST 19 97*  ALT 17 45*  ALKPHOS 49 63  BILITOT 0.9 1.7*  PROT 6.9 5.9*  ALBUMIN 3.6 3.0*   No results for input(s): LIPASE, AMYLASE in the last 168 hours. No results for input(s): AMMONIA in the last 168 hours. CBC: Recent Labs  Lab 07/17/21 1032 07/18/21 0550  WBC 5.3 12.5*  NEUTROABS 3.4  --   HGB 11.5* 11.5*  HCT 34.7* 34.5*  MCV 104.8* 106.5*   PLT 140* 124*   Cardiac Enzymes: No results for input(s): CKTOTAL, CKMB, CKMBINDEX, TROPONINI in the last 168 hours.  BNP (last 3 results) No results for input(s): BNP in the last 8760 hours.  ProBNP (last 3 results) No results for input(s): PROBNP in the last 8760 hours.  CBG: Recent Labs  Lab 07/17/21 1050 07/17/21 1807  GLUCAP 107* 127*    Radiological Exams on Admission: CT ABDOMEN PELVIS W CONTRAST  Result Date: 07/17/2021 CLINICAL DATA:  Chronic rectal and penile pain. Weight loss and fatigue. EXAM: CT ABDOMEN AND PELVIS WITH CONTRAST TECHNIQUE: Multidetector CT imaging of the abdomen and pelvis was performed using the standard protocol following bolus administration of intravenous contrast. CONTRAST:  OMNIPAQUE IOHEXOL 300 MG/ML  SOLN COMPARISON:  04/23/2021 FINDINGS: Lower chest: No acute abnormality. Hepatobiliary: Liver normal in size. Subcentimeter low-density lesion, anterior aspect of the lateral segment of the left lobe, consistent with a cyst. No other liver masses or lesions. Normal gallbladder. No bile duct dilation. Pancreas: Unremarkable. No pancreatic ductal dilatation or surrounding inflammatory changes. Spleen: Normal in size without focal abnormality. Adrenals/Urinary Tract: Small adrenal nodules, both 11 mm, stable. Kidneys normal in overall size, orientation and position. No renal masses. Small bilateral intrarenal stones. No hydronephrosis. Stones noted in the distal right ureter at and just above the ureterovesicular junction, stable from the prior CT. This causes no signs of obstruction. Normal left ureter. Bladder wall is irregular with several cellule. No bladder mass or stone. Stomach/Bowel: Stomach is unremarkable. Small bowel and colon are normal in caliber. No wall thickening. No inflammation. Normal appendix visualized. Vascular/Lymphatic: Aortic atherosclerosis. No aneurysm. Scattered subcentimeter retroperitoneal lymph nodes. No enlarged lymph  nodes. Reproductive: Findings consistent with a previous TURP. Densities within the prostate consistent with radiation therapy seeds. These findings are stable. Other: No abdominal wall hernia or abnormality. No abdominopelvic ascites. Musculoskeletal: No fracture or acute finding. No aggressive bone lesion. Previous posterior lumbar spine fusion, L3 through L5. Advanced degenerative changes throughout the visualized spine. IMPRESSION: 1. No acute findings within the abdomen or pelvis. 2. Stones in the distal right ureter without findings of obstruction, and unchanged compared to the prior CT. Small nonobstructing intrarenal stones. 3. 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 4. Aortic atherosclerosis. Electronically Signed   By: Amie Portland M.D.   On: 07/17/2021  13:02   DG Chest Port 1 View  Result Date: 07/18/2021 CLINICAL DATA:  Central line placement. EXAM: PORTABLE CHEST 1 VIEW COMPARISON:  None. FINDINGS: A right-sided venous catheter is seen. Its distal tip is noted at the junction of the superior vena cava and right atrium. Low lung volumes are present with mild, diffuse, chronic appearing increased interstitial lung markings. There is no evidence of acute infiltrate, pleural effusion or pneumothorax. Multiple sternal wires are seen. The heart size and mediastinal contours are within normal limits. Multilevel degenerative changes are noted throughout the thoracic spine. IMPRESSION: 1. Right-sided venous catheter positioning, as described above. 2. Evidence of prior median sternotomy. Electronically Signed   By: Aram Candela M.D.   On: 07/18/2021 00:51   DG Chest Port 1V same Day  Result Date: 07/18/2021 CLINICAL DATA:  Check intubation, OG tube placement. Respiratory failure. EXAM: PORTABLE CHEST 1 VIEW COMPARISON:  Portable chest earlier today at 12:36 a.m. FINDINGS: ETT is in place with tip 3.6 cm from the carina. Enteric tube has its tip in  the distal body of stomach based on position of the proximal side hole with the tip not included in the exam. A right IJ central line is stable in position with tip just above the cavoatrial junction. There are intact median sternotomy sutures and mild cardiomegaly. No vascular congestion is seen. There is increased left infrahilar lower lobe opacity consistent with atelectasis, pneumonia or aspiration. No other focal lung infiltrate is seen. There are trace pleural effusions as seen previously. Thoracic spondylosis. IMPRESSION: 1. Support tubes are adequately inserted, stable positioning right IJ line. 2. Trace pleural effusions, with increased opacity in the left lower lobe consistent with atelectasis, pneumonia or aspiration. Electronically Signed   By: Almira Bar M.D.   On: 07/18/2021 04:58   DG ABD ACUTE 2+V W 1V CHEST  Result Date: 07/17/2021 CLINICAL DATA:  Weakness, constipation. EXAM: DG ABDOMEN ACUTE WITH 1 VIEW CHEST COMPARISON:  CT abdomen pelvis 04/23/2021 FINDINGS: There is no evidence of dilated bowel loops or free intraperitoneal air. Thin linear radiopaque calculi projecting in the medial left abdomen correspond to vascular calcifications seen on prior CT 04/23/2021. Surgical clips project at the low central pelvis from prior TURP. Bilateral pedicle screw and rod fixation spans L3-L5 with intervertebral spacers. Severe degenerative disc disease at L1-L2 and L2-L3. Postoperative changes of median sternotomy. Heart size and mediastinal contours are within normal limits. No focal consolidation, pleural effusion, or pneumothorax. Subsegmental bibasilar atelectasis. IMPRESSION: Nonobstructive bowel gas pattern on abdominal radiographs. No acute cardiopulmonary abnormality. Electronically Signed   By: Sherron Ales M.D.   On: 07/17/2021 11:35    EKG: I independently viewed the EKG done and my findings are as followed: Normal sinus rhythm at a rate of 97 bpm with QTc (505  ms)   Assessment/Plan Present on Admission:  Sepsis secondary to UTI [facility])  Principal Problem:   Sepsis secondary to UTI [facility]) Active Problems:   Macrocytic anemia   Hypokalemia   Thrombocytopenia (HCC)   Bladder outlet obstruction   Low TSH level   Lactic acidosis   Obesity (BMI 30.0-34.9)   Essential hypertension   BPH (benign prostatic hyperplasia)   Acquired hypothyroidism   Sepsis secondary to UTI, currently intubated and sedated Urinalysis was indicative of UTI Continue IV ceftriaxone Continue IV Protonix to prevent stress induced ulcer Urine culture and blood culture pending Patient was intubated and sedated for protection of airways Continue IV Levophed, Precedex and fentanyl Chest x-ray and  ABG will be done in the morning Continue to evaluate patient for weaning off mechanical ventilation PCCM will be consulted  Lactic acidosis possibly secondary to above Lactic acid 5.7 > 4.4 > 2.9  Macrocytic anemia MCV 104.8; folate levels and vitamin B12 levels will be checked  Hypokalemia K+ is 3.0 K+ will be replenished Please monitor for AM K+ for further replenishmemnt  Hypomagnesemia Mg level is 1.2 This will be replenished Please continue to monitor Mg level and correct accordingly  Prolonged QTc (505 ms) Avoid QT prolonging drugs Magnesium level was 1.2 K+ level was 3.0 These will be replenished  Thrombocytopenia possibly reactive Platelets 140; continue to monitor platelet levels  Bladder outlet obstruction Continue Coude catheter Consider urology consult prior to discharge  Essential hypertension BP meds will be held at this time due to hypotension requiring vasopressors at this time  Acquired hypothyroidism Continue IV Synthroid if patient continues to be on vent, otherwise patient can resume oral home Synthroid  Low TSH TSH 0.053; this may not be accurate considering patient's current acute illness This may be repeated when patient has  recovered completely from his illness in an outpatient setting  BPH Home Flomax will be held at this time due to patient's hypotensive state requiring vasopressors   DVT prophylaxis: Lovenox  Code Status: Full code  Family Communication: Daughter at bedside (all questions answered to satisfaction)  Disposition Plan:  Patient is from:                        home Anticipated DC to:                   SNF or family members home Anticipated DC date:               2-3 days Anticipated DC barriers:          Patient requires inpatient management due to sepsis secondary to UTI requiring IV antibiotics and current dependence on vent  Consults called: PCCM  Admission status: Inpatient    Frankey Shown MD [facility] 07/18/2021, 6:42 AM

## 2021-07-18 NOTE — Plan of Care (Signed)

## 2021-07-18 NOTE — Progress Notes (Signed)
Patient's daughter made this RN aware that patient had blood cultures done on Tuesday, Dec 13 at the [facility] but they send all labs to [place]. This RN called [employer] and they are faxing the results to [facility].

## 2021-07-18 NOTE — ED Notes (Signed)
Attempted to wean pt off NRB and place back on 2l [place]. However pt quickly desat to 81% on 4l. Placed back on NRB

## 2021-07-18 NOTE — H&P (Incomplete)
History and Physical  Patrick Kerr:[id number] DOB: 09/26/1945 DOA: 07/17/2021  Referring physician: Marland Kitchen  PCP: [facility]  Outpatient Specialists: *** Patient coming from: *** & is able to ambulate ***  Chief Complaint: ***   HPI: Patrick Kerr is a 75 y.o. male with medical history significant for *** (For level 3, the HPI must include 4+ descriptors: Location, Quality, Severity, Duration, Timing, Context, modifying factors, associated signs/symptoms and/or status of 3+ chronic problems.)   ED Course: ***  Review of Systems: Review of systems as noted in the HPI. All other systems reviewed and are negative.   Past Medical History:  Diagnosis Date   Arthritis    Hypertension    Past Surgical History:  Procedure Laterality Date   CIRCUMCISION  05/25/2021   TRANSURETHRAL RESECTION OF PROSTATE      Social History:  reports that he has never smoked. He has never used smokeless tobacco. He reports that he does not drink alcohol and does not use drugs.   Allergies  Allergen Reactions   Amitriptyline Other (See Comments)    Elavil makes him "hyper".   Azithromycin Other (See Comments)    Causes ulcers   Bactrim [Sulfamethoxazole-Trimethoprim] Other (See Comments)   Erythromycin Other (See Comments)    Pt states gave him an ulcer   Mirabegron Other (See Comments)    Headache   Prednisone Itching and Other (See Comments)    Numbness, pt states feet felt like they were on fire   Hydromorphone Itching    No family history on file.  ***  Prior to Admission medications   Medication Sig Start Date End Date Taking? Authorizing Provider  Acetaminophen 325 MG CAPS Take 2 capsules by mouth 2 (two) times daily.   Yes [provider]  ARIPiprazole (ABILIFY) 5 MG tablet Take 5 mg by mouth at bedtime. 05/19/21  Yes [provider]  cephALEXin (KEFLEX) 500 MG capsule Take 1 capsule (500 mg total) by mouth 4 (four) times daily. 07/17/21  Yes Idol,  Raynelle Fanning, PA-C  cyanocobalamin (,VITAMIN B-12,) 1000 MCG/ML injection Inject into the muscle.   Yes [provider]  Docusate Sodium (DSS) 100 MG CAPS Take 1 capsule by mouth 2 (two) times daily. 01/09/13  Yes [provider]  Ergocalciferol 50 MCG (2000 UT) TABS Take 1 tablet by mouth daily.   Yes [provider]  ibuprofen (ADVIL) 800 MG tablet Take by mouth.   Yes [provider]  levothyroxine (SYNTHROID, LEVOTHROID) 137 MCG tablet Take 137 mcg by mouth daily before breakfast.   Yes [provider]  LORazepam (ATIVAN) 1 MG tablet Take 1 mg by mouth See admin instructions. Take 1/2 in the morning and 1/2 every evening can take up to 2 tablets   Yes [provider]  phenazopyridine (PYRIDIUM) 200 MG tablet Take 1 tablet (200 mg total) by mouth 3 (three) times daily. 07/17/21  Yes Idol, Raynelle Fanning, PA-C  polyethylene glycol powder (GLYCOLAX/MIRALAX) 17 GM/SCOOP powder Mix 1 scoopful in 4 to 8 ounces of liquid and drink every day 02/05/21  Yes [provider]  sertraline (ZOLOFT) 100 MG tablet Take 200 mg by mouth daily.   Yes [provider]  tamsulosin (FLOMAX) 0.4 MG CAPS capsule Take 0.4 mg by mouth daily.   Yes [provider]  doxycycline (VIBRA-TABS) 100 MG tablet Take 100 mg by mouth 2 (two) times daily. Patient not taking: Reported on 07/17/2021 06/09/21   [provider]  hydrochlorothiazide (HYDRODIURIL) 25  MG tablet Take by mouth. Patient not taking: Reported on 07/17/2021    [provider]    Physical Exam: BP (!) 96/56    Pulse (!) 106    Temp 97.9 F (36.6 C)    Resp (!) 24    Ht 6\' 4"  (1.93 m)    Wt 127 kg    SpO2 93%    BMI 34.08 kg/m   General: 75 y.o. year-old male well developed well nourished in no acute distress.  Alert and oriented x3. HEENT: NCAT, EOMI Neck: Supple, trachea medial Cardiovascular: Regular rate and rhythm with no rubs or gallops.  No thyromegaly or JVD noted.  No  lower extremity edema. 2/4 pulses in all 4 extremities. Respiratory: Clear to auscultation with no wheezes or rales. Good inspiratory effort. Abdomen: Soft, nontender nondistended with normal bowel sounds x4 quadrants. Muskuloskeletal: No cyanosis, clubbing or edema noted bilaterally Neuro: CN II-XII intact, strength 5/5 x 4, sensation, reflexes intact Skin: No ulcerative lesions noted or rashes Psychiatry: Judgement and insight appear normal. Mood is appropriate for condition and setting          Labs on Admission:  Basic Metabolic Panel: Recent Labs  Lab 07/17/21 1032 07/17/21 1834  NA 138  --   K 3.0*  --   CL 103  --   CO2 26  --   GLUCOSE 112*  --   BUN 17  --   CREATININE 0.95  --   CALCIUM 8.9  --   MG  --  1.2*   Liver Function Tests: Recent Labs  Lab 07/17/21 1032  AST 19  ALT 17  ALKPHOS 49  BILITOT 0.9  PROT 6.9  ALBUMIN 3.6   No results for input(s): LIPASE, AMYLASE in the last 168 hours. No results for input(s): AMMONIA in the last 168 hours. CBC: Recent Labs  Lab 07/17/21 1032  WBC 5.3  NEUTROABS 3.4  HGB 11.5*  HCT 34.7*  MCV 104.8*  PLT 140*   Cardiac Enzymes: No results for input(s): CKTOTAL, CKMB, CKMBINDEX, TROPONINI in the last 168 hours.  BNP (last 3 results) No results for input(s): BNP in the last 8760 hours.  ProBNP (last 3 results) No results for input(s): PROBNP in the last 8760 hours.  CBG: Recent Labs  Lab 07/17/21 1050 07/17/21 1807  GLUCAP 107* 127*    Radiological Exams on Admission: CT ABDOMEN PELVIS W CONTRAST  Result Date: 07/17/2021 CLINICAL DATA:  Chronic rectal and penile pain. Weight loss and fatigue. EXAM: CT ABDOMEN AND PELVIS WITH CONTRAST TECHNIQUE: Multidetector CT imaging of the abdomen and pelvis was performed using the standard protocol following bolus administration of intravenous contrast. CONTRAST:  07/19/2021 OMNIPAQUE IOHEXOL 300 MG/ML  SOLN COMPARISON:  04/23/2021 FINDINGS: Lower chest: No acute  abnormality. Hepatobiliary: Liver normal in size. Subcentimeter low-density lesion, anterior aspect of the lateral segment of the left lobe, consistent with a cyst. No other liver masses or lesions. Normal gallbladder. No bile duct dilation. Pancreas: Unremarkable. No pancreatic ductal dilatation or surrounding inflammatory changes. Spleen: Normal in size without focal abnormality. Adrenals/Urinary Tract: Small adrenal nodules, both 11 mm, stable. Kidneys normal in overall size, orientation and position. No renal masses. Small bilateral intrarenal stones. No hydronephrosis. Stones noted in the distal right ureter at and just above the ureterovesicular junction, stable from the prior CT. This causes no signs of obstruction. Normal left ureter. Bladder wall is irregular with several cellule. No bladder mass or stone. Stomach/Bowel: Stomach is unremarkable. Small bowel  and colon are normal in caliber. No wall thickening. No inflammation. Normal appendix visualized. Vascular/Lymphatic: Aortic atherosclerosis. No aneurysm. Scattered subcentimeter retroperitoneal lymph nodes. No enlarged lymph nodes. Reproductive: Findings consistent with a previous TURP. Densities within the prostate consistent with radiation therapy seeds. These findings are stable. Other: No abdominal wall hernia or abnormality. No abdominopelvic ascites. Musculoskeletal: No fracture or acute finding. No aggressive bone lesion. Previous posterior lumbar spine fusion, L3 through L5. Advanced degenerative changes throughout the visualized spine. IMPRESSION: 1. No acute findings within the abdomen or pelvis. 2. Stones in the distal right ureter without findings of obstruction, and unchanged compared to the prior CT. Small nonobstructing intrarenal stones. 3. 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 4. Aortic atherosclerosis. Electronically Signed   By: Amie Portland M.D.   On: 07/17/2021 13:02   DG Chest  Port 1 View  Result Date: 07/18/2021 CLINICAL DATA:  Central line placement. EXAM: PORTABLE CHEST 1 VIEW COMPARISON:  None. FINDINGS: A right-sided venous catheter is seen. Its distal tip is noted at the junction of the superior vena cava and right atrium. Low lung volumes are present with mild, diffuse, chronic appearing increased interstitial lung markings. There is no evidence of acute infiltrate, pleural effusion or pneumothorax. Multiple sternal wires are seen. The heart size and mediastinal contours are within normal limits. Multilevel degenerative changes are noted throughout the thoracic spine. IMPRESSION: 1. Right-sided venous catheter positioning, as described above. 2. Evidence of prior median sternotomy. Electronically Signed   By: Aram Candela M.D.   On: 07/18/2021 00:51   DG ABD ACUTE 2+V W 1V CHEST  Result Date: 07/17/2021 CLINICAL DATA:  Weakness, constipation. EXAM: DG ABDOMEN ACUTE WITH 1 VIEW CHEST COMPARISON:  CT abdomen pelvis 04/23/2021 FINDINGS: There is no evidence of dilated bowel loops or free intraperitoneal air. Thin linear radiopaque calculi projecting in the medial left abdomen correspond to vascular calcifications seen on prior CT 04/23/2021. Surgical clips project at the low central pelvis from prior TURP. Bilateral pedicle screw and rod fixation spans L3-L5 with intervertebral spacers. Severe degenerative disc disease at L1-L2 and L2-L3. Postoperative changes of median sternotomy. Heart size and mediastinal contours are within normal limits. No focal consolidation, pleural effusion, or pneumothorax. Subsegmental bibasilar atelectasis. IMPRESSION: Nonobstructive bowel gas pattern on abdominal radiographs. No acute cardiopulmonary abnormality. Electronically Signed   By: Sherron Ales M.D.   On: 07/17/2021 11:35    EKG: I independently viewed the EKG done and my findings are as followed: ***   Assessment/Plan Present on Admission:  Sepsis secondary to UTI  [facility])  Principal Problem:   Sepsis secondary to UTI (HCC)   DVT prophylaxis: ***   Code Status: ***   Family Communication: ***   Disposition Plan: ***   Consults called: ***   Admission status: ***     Frankey Shown MD [facility] Pager [phone]  If 7PM-7AM, please contact night-coverage [email or web address] Password [facility]  07/18/2021, 2:39 AM

## 2021-07-18 NOTE — Progress Notes (Signed)
eLink Physician-Brief Progress Note Patient Name: Patrick Kerr DOB: 27-Mar-1946 MRN: [id number]   Date of Service  07/18/2021  HPI/Events of Note  Pt intubated approx 1-2 hours ago  eICU Interventions  - in ICU - MAP stable - well sedated; in sync with vent     Intervention Category Evaluation Type: New Patient Evaluation  Jacinta Shoe 07/18/2021, 5:44 AM

## 2021-07-18 NOTE — Progress Notes (Signed)
°   07/18/21 0100  Clinical Encounter Type  Visited With Patient and family together  Visit Type Spiritual support  Referral From Nurse  Consult/Referral To Chaplain   Chaplain called in to offer prayer and pastoral support to daughter the caregiver to her father (patient) and her mother.  Pt felt anxious and overwhelmed by the health of her father.  Chaplain prayed with daughter and then prayed with pt and daughter together.

## 2021-07-18 NOTE — Clinical Social Work Note (Signed)
[facility] notification done [phone]

## 2021-07-18 NOTE — Progress Notes (Signed)
Patrick Kerr is a 75 y.o. male with medical history significant for hypertension, BPH with history of TURP, hypothyroidism who presents to the emergency department accompanied by daughter due to weakness.  Patient was unable to provide history, history was obtained from ED physician and daughter at bedside.  Per report, patient recently had circumcision in October 2022 due to recent development of phimosis, patient did complain of burning penile sensation prior to and after circumcision.  He was prescribed a 7-day course of doxycycline followed with Macrobid for possible prostate infection through the [place] system and had urine culture demonstrating Klebsiella that was sensitive to Rocephin.  He continued to have ongoing weakness despite use of antibiotics.  He was admitted with septic shock present on admission secondary to Klebsiella UTI noted at outside hospital.  Septic shock secondary to UTI-presumed Klebsiella, present on admission -Norepinephrine being weaned -Continue IV Rocephin at 2 g dose -Follow urine and blood cultures -Monitor a.m. labs with lactic acid and procalcitonin -Continue IV fluid  Urinary retention related to above-acute -Status post coud catheter placement  Intractable nausea and vomiting with hypotension related to above factors -Patient intubated for airway protection -Remains on mechanical ventilation for support given current instability related to sepsis -Appreciate PCCM evaluation in a.m.  Hypokalemia/hypomagnesemia/hypophosphatemia -Replete and continue monitoring  Prolonged QTC -Avoid QT prolonging medications and supplement electrolytes  Thrombocytopenia -Continue monitoring  Essential hypertension -Hold BP medications due to ongoing hypotension  Hypothyroidism -Started on IV Synthroid  BPH -Hold Flomax due to low blood pressure readings  Total critical care time: 70 minutes

## 2021-07-18 NOTE — Code Documentation (Signed)
Family updated as to patient's status.

## 2021-07-18 NOTE — ED Notes (Signed)
Hospitalist at bedside 

## 2021-07-19 ENCOUNTER — Inpatient Hospital Stay (HOSPITAL_COMMUNITY): Payer: No Typology Code available for payment source

## 2021-07-19 DIAGNOSIS — J9601 Acute respiratory failure with hypoxia: Secondary | ICD-10-CM | POA: Diagnosis not present

## 2021-07-19 DIAGNOSIS — N390 Urinary tract infection, site not specified: Secondary | ICD-10-CM | POA: Diagnosis not present

## 2021-07-19 DIAGNOSIS — A419 Sepsis, unspecified organism: Secondary | ICD-10-CM

## 2021-07-19 DIAGNOSIS — A498 Other bacterial infections of unspecified site: Secondary | ICD-10-CM | POA: Diagnosis not present

## 2021-07-19 DIAGNOSIS — R6521 Severe sepsis with septic shock: Secondary | ICD-10-CM | POA: Diagnosis not present

## 2021-07-19 LAB — CBC
HCT: 35.4 % — ABNORMAL LOW (ref 39.0–52.0)
Hemoglobin: 11.6 g/dL — ABNORMAL LOW (ref 13.0–17.0)
MCH: 34.3 pg — ABNORMAL HIGH (ref 26.0–34.0)
MCHC: 32.8 g/dL (ref 30.0–36.0)
MCV: 104.7 fL — ABNORMAL HIGH (ref 80.0–100.0)
Platelets: 95 10*3/uL — ABNORMAL LOW (ref 150–400)
RBC: 3.38 MIL/uL — ABNORMAL LOW (ref 4.22–5.81)
RDW: 12 % (ref 11.5–15.5)
WBC: 19.3 10*3/uL — ABNORMAL HIGH (ref 4.0–10.5)
nRBC: 0 % (ref 0.0–0.2)

## 2021-07-19 LAB — GLUCOSE, CAPILLARY
Glucose-Capillary: 122 mg/dL — ABNORMAL HIGH (ref 70–99)
Glucose-Capillary: 115 mg/dL — ABNORMAL HIGH (ref 70–99)
Glucose-Capillary: 115 mg/dL — ABNORMAL HIGH (ref 70–99)
Glucose-Capillary: 114 mg/dL — ABNORMAL HIGH (ref 70–99)
Glucose-Capillary: 124 mg/dL — ABNORMAL HIGH (ref 70–99)
Glucose-Capillary: 131 mg/dL — ABNORMAL HIGH (ref 70–99)

## 2021-07-19 LAB — COMPREHENSIVE METABOLIC PANEL
ALT: 48 U/L — ABNORMAL HIGH (ref 0–44)
AST: 90 U/L — ABNORMAL HIGH (ref 15–41)
Albumin: 2.5 g/dL — ABNORMAL LOW (ref 3.5–5.0)
Alkaline Phosphatase: 55 U/L (ref 38–126)
Anion gap: 11 (ref 5–15)
BUN: 23 mg/dL (ref 8–23)
CO2: 19 mmol/L — ABNORMAL LOW (ref 22–32)
Calcium: 8 mg/dL — ABNORMAL LOW (ref 8.9–10.3)
Chloride: 104 mmol/L (ref 98–111)
Creatinine, Ser: 1.11 mg/dL (ref 0.61–1.24)
GFR, Estimated: >60 mL/min (ref >60)
Glucose, Bld: 126 mg/dL — ABNORMAL HIGH (ref 70–99)
Potassium: 3.3 mmol/L — ABNORMAL LOW (ref 3.5–5.1)
Sodium: 134 mmol/L — ABNORMAL LOW (ref 135–145)
Total Bilirubin: 1.6 mg/dL — ABNORMAL HIGH (ref 0.3–1.2)
Total Protein: 5.6 g/dL — ABNORMAL LOW (ref 6.5–8.1)

## 2021-07-19 LAB — BLOOD GAS, ARTERIAL
Acid-base deficit: 4.8 mmol/L — ABNORMAL HIGH (ref 0.0–2.0)
Bicarbonate: 20.9 mmol/L (ref 20.0–28.0)
Drawn by: 22223
FIO2: 40
O2 Saturation: 96.2 %
Patient temperature: 37
pCO2 arterial: 30.3 mmHg — ABNORMAL LOW (ref 32.0–48.0)
pH, Arterial: 7.414 (ref 7.350–7.450)
pO2, Arterial: 95.3 mmHg (ref 83.0–108.0)

## 2021-07-19 LAB — LACTIC ACID, PLASMA: Lactic Acid, Venous: 3.3 mmol/L (ref 0.5–1.9)

## 2021-07-19 LAB — PROCALCITONIN: Procalcitonin: 39.86 ng/mL

## 2021-07-19 LAB — MAGNESIUM: Magnesium: 1.7 mg/dL (ref 1.7–2.4)

## 2021-07-19 MED ORDER — VITAL HIGH PROTEIN PO LIQD
1000.0000 mL | ORAL | Status: DC
  Administered 2021-07-19: 17:00:00 1000 mL

## 2021-07-19 MED ORDER — ACETAMINOPHEN 325 MG PO TABS
650.0000 mg | ORAL_TABLET | Freq: Four times a day (QID) | ORAL | Status: DC | PRN
  Administered 2021-07-19: 13:00:00 650 mg
  Filled 2021-07-19: qty 2

## 2021-07-19 MED ORDER — POTASSIUM CHLORIDE 20 MEQ PO PACK
40.0000 meq | PACK | Freq: Once | ORAL | Status: AC
  Administered 2021-07-19: 06:00:00 40 meq
  Filled 2021-07-19: qty 2

## 2021-07-19 MED ORDER — POTASSIUM CHLORIDE CRYS ER 20 MEQ PO TBCR: 40.0000 meq | EXTENDED_RELEASE_TABLET | Freq: Once | ORAL | Status: DC

## 2021-07-19 MED ORDER — ORAL CARE MOUTH RINSE
15.0000 mL | OROMUCOSAL | Status: DC
  Administered 2021-07-19 – 2021-07-20 (×8): 15 mL via OROMUCOSAL

## 2021-07-19 MED ORDER — PANTOPRAZOLE 2 MG/ML SUSPENSION
40.0000 mg | Freq: Every day | ORAL | Status: DC
  Administered 2021-07-19 – 2021-07-20 (×2): 40 mg
  Filled 2021-07-19 (×2): qty 20

## 2021-07-19 MED ORDER — LEVOTHYROXINE SODIUM 25 MCG PO TABS
137.0000 ug | ORAL_TABLET | Freq: Every day | ORAL | Status: DC
  Administered 2021-07-20: 06:00:00 137 ug
  Filled 2021-07-19: qty 1

## 2021-07-19 MED ORDER — PROPOFOL 1000 MG/100ML IV EMUL
0.0000 ug/kg/min | INTRAVENOUS | Status: DC
Start: 2021-07-19 — End: 2021-07-20
  Administered 2021-07-19 (×2): 40 ug/kg/min via INTRAVENOUS
  Administered 2021-07-19: 16:00:00 35 ug/kg/min via INTRAVENOUS
  Administered 2021-07-20 (×3): 40 ug/kg/min via INTRAVENOUS
  Filled 2021-07-19 (×5): qty 100

## 2021-07-19 MED ORDER — FENTANYL CITRATE PF 50 MCG/ML IJ SOSY
25.0000 ug | PREFILLED_SYRINGE | INTRAMUSCULAR | Status: DC | PRN
  Administered 2021-07-19: 22:00:00 100 ug via INTRAVENOUS
  Administered 2021-07-19: 16:00:00 25 ug via INTRAVENOUS
  Administered 2021-07-20: 07:00:00 100 ug via INTRAVENOUS
  Filled 2021-07-19 (×2): qty 2
  Filled 2021-07-19 (×2): qty 1

## 2021-07-19 MED ORDER — PROPOFOL 1000 MG/100ML IV EMUL
INTRAVENOUS | Status: AC
  Filled 2021-07-19: qty 100

## 2021-07-19 MED ORDER — MAGNESIUM SULFATE 2 GM/50ML IV SOLN
2.0000 g | Freq: Once | INTRAVENOUS | Status: AC
  Administered 2021-07-19: 06:00:00 2 g via INTRAVENOUS
  Filled 2021-07-19: qty 50

## 2021-07-19 MED ORDER — POTASSIUM CHLORIDE 10 MEQ/100ML IV SOLN
10.0000 meq | INTRAVENOUS | Status: AC
  Administered 2021-07-19 (×2): 10 meq via INTRAVENOUS
  Filled 2021-07-19 (×2): qty 100

## 2021-07-19 MED ORDER — POLYETHYLENE GLYCOL 3350 17 G PO PACK: 17.0000 g | PACK | Freq: Every day | ORAL | Status: DC | PRN

## 2021-07-19 MED ORDER — LORAZEPAM 2 MG/ML IJ SOLN
1.0000 mg | INTRAMUSCULAR | Status: DC | PRN
  Administered 2021-07-19 – 2021-07-25 (×10): 1 mg via INTRAVENOUS
  Filled 2021-07-19 (×11): qty 1

## 2021-07-19 MED ORDER — CHLORHEXIDINE GLUCONATE 0.12% ORAL RINSE (MEDLINE KIT)
15.0000 mL | Freq: Two times a day (BID) | OROMUCOSAL | Status: DC
  Administered 2021-07-19 – 2021-07-20 (×2): 15 mL via OROMUCOSAL

## 2021-07-19 MED ORDER — PROSOURCE TF PO LIQD
90.0000 mL | Freq: Four times a day (QID) | ORAL | Status: DC
  Administered 2021-07-19 – 2021-07-20 (×3): 90 mL
  Filled 2021-07-19 (×3): qty 90

## 2021-07-19 MED ORDER — PROPOFOL 1000 MG/100ML IV EMUL
5.0000 ug/kg/min | INTRAVENOUS | Status: DC
  Administered 2021-07-19: 10:00:00 35 ug/kg/min via INTRAVENOUS
  Administered 2021-07-19 (×2): 30 ug/kg/min via INTRAVENOUS
  Administered 2021-07-19: 14:00:00 35 ug/kg/min via INTRAVENOUS
  Filled 2021-07-19 (×3): qty 100

## 2021-07-19 MED ORDER — VITAL HIGH PROTEIN PO LIQD: 1000.0000 mL | ORAL | Status: DC

## 2021-07-19 MED ORDER — DOCUSATE SODIUM 50 MG/5ML PO LIQD: 100.0000 mg | Freq: Every day | ORAL | Status: DC | PRN

## 2021-07-19 NOTE — Plan of Care (Signed)

## 2021-07-19 NOTE — Consult Note (Signed)
[place] [facility]   Patient name: Patrick Kerr Admit date: 07/17/2021  DOB: 11/19/1945 LOS: 1  MRN: [id number] Consult date: 07/19/2021  Referring provider: Dr. Manuella Ghazi, [facility]: Pelvic pain    History:  75 yo male former smoker brought to [facility] ER with weakness and pelvic pain.  He had circumcision in October 2022 for phimosis, and developed dysuria after this.  He had outpt treatment with antibiotics w/o improvement.  He had foley catheter from 06/04/21 to 07/05/21.  His outpt lab work showed hypokalemia and anemia.  He was started on antibiotics for UTI and pressors for septic shock with SBP in 70's.  Found to have hypoxia with SpO2 81% on room air in ER.  He had vomiting episode and concern for aspiration, and required intubation on 07/18/21.  PCCM asked to assist with management in ICU.  Hx from chart and medical team.    Past medical history:  HTN, BPH s/p TURP, Hypothyroidism, Depression  Significant events:  12/17 Admit 12/18 intubated  Studies:  CT abd/pelvis 12/17 >> stones in distal Rt ureter, chronic bladder outlet obstruction  Micro:  Blood 12/17 >> Urine 12/17 >> Klebsiella   Lines:  Rt IJ CVL 12/18 >> ETT 12/18 >>    Antibiotics:  Rocephin 12/17 >>   Consults:      Interim history:    Vital signs:  BP (!) 106/47    Pulse 90    Temp 98.9 F (37.2 C) (Rectal)    Resp 18    Ht 6\' 4"  (1.93 m)    Wt 132.5 kg    SpO2 96%    BMI 35.56 kg/m   Intake/output:  I/O last 3 completed shifts: In: 7292.1 [I.V.:4621.8; NG/GT:260; IV Piggyback:2410.3] Out: 2275 [Urine:2275]   Physical exam:   General - sedated Eyes - pupils reactive ENT - ETT in place Cardiac - regular rate/rhythm, no murmur Chest - scattered rhonchi Abdomen - soft, non tender, + bowel sounds Extremities - 1+ edema Skin - no rashes Neuro - RASS -2  Best practice:   DVT - SCDs SUP - protonix Nutrition - tube feeds   Assessment/plan:    Septic shock in setting of Klebsiella UTI, POA. - wean pressors to keep MAP >65 - day 3 of rocephin  Acute hypoxic respiratory failure with concern for aspiration. - goal SpO2 > 92% - f/u CXR - continue full vent support - anticipate he will be ready for vent weaning in next 24 to 48 hrs  Acute metabolic encephalopathy from sepsis. Hx of depression. - uses ativan as outpt - RASS goal 0 to -1 - add prn fentanyl - hold outpt abilify, zoloft  Thrombocytopenia. - likely related to sepsis - f/u CBC  Hypokalemia. - f/u BMET  Hypothyroidism. - continue synthroid  Prolonged Qtc. - monitor on tele   Resolved hospital problems:  Lactic acidosis  Goals of care/Family discussions:  Code status: full  Updated pt's daughter at bedside  Labs:   CMP Latest Ref Rng & Units 07/19/2021 07/18/2021 07/17/2021  Glucose 70 - 99 mg/dL 126(H) 196(H) 112(H)  BUN 8 - 23 mg/dL 23 17 17   Creatinine 0.61 - 1.24 mg/dL 1.11 1.35(H) 0.95  Sodium 135 - 145 mmol/L 134(L) 137 138  Potassium 3.5 - 5.1 mmol/L 3.3(L) 2.4(LL) 3.0(L)  Chloride 98 - 111 mmol/L 104 107 103  CO2 22 - 32 mmol/L 19(L) 18(L) 26  Calcium 8.9 - 10.3 mg/dL 8.0(L) 8.3(L) 8.9  Total Protein 6.5 - 8.1  g/dL 0.2(O) 5.9(L) 6.9  Total Bilirubin 0.3 - 1.2 mg/dL 3.7(C) 5.8(I) 0.9  Alkaline Phos 38 - 126 U/L 55 63 49  AST 15 - 41 U/L 90(H) 97(H) 19  ALT 0 - 44 U/L 48(H) 45(H) 17    CBC Latest Ref Rng & Units 07/19/2021 07/18/2021 07/17/2021  WBC 4.0 - 10.5 K/uL 19.3(H) 12.5(H) 5.3  Hemoglobin 13.0 - 17.0 g/dL 11.6(L) 11.5(L) 11.5(L)  Hematocrit 39.0 - 52.0 % 35.4(L) 34.5(L) 34.7(L)  Platelets 150 - 400 K/uL 95(L) 124(L) 140(L)    ABG    Component Value Date/Time   PHART 7.414 07/19/2021 0530   PCO2ART 30.3 (L) 07/19/2021 0530   PO2ART 95.3 07/19/2021 0530   HCO3 20.9 07/19/2021 0530   ACIDBASEDEF 4.8 (H) 07/19/2021 0530   O2SAT 96.2 07/19/2021 0530    CBG (last 3)  Recent Labs    07/19/21 0351 07/19/21 0740  07/19/21 1117  GLUCAP 122* 115* 115*     Past surgical history:  He  has a past surgical history that includes Transurethral resection of prostate and Circumcision (05/25/2021).  Social history:  He  reports that he has never smoked. He has never used smokeless tobacco. He reports that he does not drink alcohol and does not use drugs.   Review of systems:  Unable to obtain.  Family history:  His family history is not on file.    Medications:   No current facility-administered medications on file prior to encounter.   Current Outpatient Medications on File Prior to Encounter  Medication Sig   Acetaminophen 325 MG CAPS Take 2 capsules by mouth 2 (two) times daily.   ARIPiprazole (ABILIFY) 5 MG tablet Take 5 mg by mouth at bedtime.   cyanocobalamin (,VITAMIN B-12,) 1000 MCG/ML injection Inject into the muscle.   Docusate Sodium (DSS) 100 MG CAPS Take 1 capsule by mouth 2 (two) times daily.   Ergocalciferol 50 MCG (2000 UT) TABS Take 1 tablet by mouth daily.   ibuprofen (ADVIL) 800 MG tablet Take by mouth.   levothyroxine (SYNTHROID, LEVOTHROID) 137 MCG tablet Take 137 mcg by mouth daily before breakfast.   LORazepam (ATIVAN) 1 MG tablet Take 1 mg by mouth See admin instructions. Take 1/2 in the morning and 1/2 every evening can take up to 2 tablets   polyethylene glycol powder (GLYCOLAX/MIRALAX) 17 GM/SCOOP powder Mix 1 scoopful in 4 to 8 ounces of liquid and drink every day   sertraline (ZOLOFT) 100 MG tablet Take 200 mg by mouth daily.   tamsulosin (FLOMAX) 0.4 MG CAPS capsule Take 0.4 mg by mouth daily.   doxycycline (VIBRA-TABS) 100 MG tablet Take 100 mg by mouth 2 (two) times daily. (Patient not taking: Reported on 07/17/2021)   hydrochlorothiazide (HYDRODIURIL) 25 MG tablet Take by mouth. (Patient not taking: Reported on 07/17/2021)     Critical care time: 38 minutes  Coralyn Helling, MD [facility] Pulmonary/Critical Care Pager - [phone] 07/19/2021, 2:46  PM

## 2021-07-19 NOTE — Progress Notes (Signed)
RN called due to patient being awake, trying to get up from bed, fighting the vent despite  400 mcg/hour of Fentanyl and 1.2 of Precedex.  It was decided to change the sedation to propofol.  Precedex and fentanyl were discontinued and propofol was started.

## 2021-07-19 NOTE — Progress Notes (Signed)
PROGRESS NOTE    Patrick Kerr  DPO:[id number] DOB: [birth year] DOA: 07/17/2021 PCP: [facility]   Brief Narrative:   SAMULE LIFE is a 75 y.o. male with medical history significant for hypertension, BPH with history of TURP, hypothyroidism who presents to the emergency department accompanied by daughter due to weakness.  Patient was unable to provide history, history was obtained from ED physician and daughter at bedside.  Per report, patient recently had circumcision in October 2022 due to recent development of phimosis, patient did complain of burning penile sensation prior to and after circumcision.  He was prescribed a 7-day course of doxycycline followed with Macrobid for possible prostate infection through the [place] system and had urine culture demonstrating Klebsiella that was sensitive to Rocephin.  He continued to have ongoing weakness despite use of antibiotics.  He was admitted with septic shock present on admission secondary to Klebsiella UTI noted at [place].  Assessment & Plan:   Principal Problem:   Sepsis secondary to UTI [facility]) Active Problems:   Macrocytic anemia   Hypokalemia   Thrombocytopenia (HCC)   Bladder outlet obstruction   Low TSH level   Lactic acidosis   Obesity (BMI 30.0-34.9)   Essential hypertension   BPH (benign prostatic hyperplasia)   Acquired hypothyroidism   Septic shock secondary to UTI-presumed Klebsiella, present on admission -GNR in Urine noted with further ID and sensitivity pending -Norepinephrine being weaned -Continue IV Rocephin at 2 g dose daily with improvements in fever -Blood cultures with no growth to date -Lactic acid remains elevated and procalcitonin downtrending -Continue IV fluid -Dietitian consultation for tube feeding   Urinary retention related to above-acute -Status post coud catheter placement   Intractable nausea and vomiting with hypotension related to above factors -Patient intubated for airway  protection -Remains on mechanical ventilation for support given current instability related to sepsis -Appreciate PCCM evaluation today   Hypokalemia -Replete and continue monitoring   Prolonged QTC -Avoid QT prolonging medications and supplement electrolytes   Thrombocytopenia-downtrending -Continue monitoring   Essential hypertension -Hold BP medications due to ongoing hypotension   Hypothyroidism -Started on IV Synthroid   BPH -Hold Flomax due to low blood pressure readings   DVT prophylaxis: Lovenox to SCDs Code Status: Full Family Communication: Spoke with daughter 12/18, plan to visit at bedside Disposition Plan:  Status is: Inpatient  Remains inpatient appropriate because: Critical illness with need for IV antibiotics and intubation.  Consultants:  PCCM  Procedures:  See below  Antimicrobials:  Anti-infectives (From admission, onward)    Start     Dose/Rate Route Frequency Ordered Stop   07/19/21 0700  cefTRIAXone (ROCEPHIN) 2 g in sodium chloride 0.9 % 100 mL IVPB        2 g 200 mL/hr over 30 Minutes Intravenous Every 24 hours 07/18/21 1157     07/18/21 0800  cefTRIAXone (ROCEPHIN) 2 g in sodium chloride 0.9 % 100 mL IVPB  Status:  Discontinued        2 g 200 mL/hr over 30 Minutes Intravenous Every 24 hours 07/18/21 0711 07/18/21 1106   07/17/21 1645  cefTRIAXone (ROCEPHIN) 1 g in sodium chloride 0.9 % 100 mL IVPB        1 g 200 mL/hr over 30 Minutes Intravenous  Once 07/17/21 1632 07/17/21 1835   07/17/21 0000  cephALEXin (KEFLEX) 500 MG capsule        500 mg Oral 4 times daily 07/17/21 1736  Subjective: Patient seen and evaluated today noted agitation overnight that has required sedation with propofol which he appears to be tolerating better.  Fevers have improved overnight.  Objective: Vitals:   07/19/21 0700 07/19/21 0730 07/19/21 0800 07/19/21 0845  BP: (!) 117/53 (!) 120/55 (!) 92/43 (!) 101/40  Pulse: 85 80 79 76  Resp: 20 20 18 18    Temp:      TempSrc:      SpO2: 97% 98% 98% 97%  Weight:      Height:        Intake/Output Summary (Last 24 hours) at 07/19/2021 0924 Last data filed at 07/19/2021 9678 Gross per 24 hour  Intake 6049.8 ml  Output 1275 ml  Net 4774.8 ml   Filed Weights   07/17/21 1000 07/18/21 0600  Weight: 127 kg 123.9 kg    Examination:  General exam: Appears sedated Respiratory system: Clear to auscultation. Respiratory effort normal.  Intubated with FiO2 40% Cardiovascular system: S1 & S2 heard, RRR.  Gastrointestinal system: Abdomen is soft Central nervous system: Sedated Extremities: No edema Skin: No significant lesions noted Psychiatry: Cannot be assessed Foley with reddish/pink urine output    Data Reviewed: I have personally reviewed following labs and imaging studies  CBC: Recent Labs  Lab 07/17/21 1032 07/18/21 0550 07/19/21 0431  WBC 5.3 12.5* 19.3*  NEUTROABS 3.4  --   --   HGB 11.5* 11.5* 11.6*  HCT 34.7* 34.5* 35.4*  MCV 104.8* 106.5* 104.7*  PLT 140* 124* 95*   Basic Metabolic Panel: Recent Labs  Lab 07/17/21 1032 07/17/21 1834 07/18/21 0550 07/19/21 0431  NA 138  --  137 134*  K 3.0*  --  2.4* 3.3*  CL 103  --  107 104  CO2 26  --  18* 19*  GLUCOSE 112*  --  196* 126*  BUN 17  --  17 23  CREATININE 0.95  --  1.35* 1.11  CALCIUM 8.9  --  8.3* 8.0*  MG  --  1.2* 1.5* 1.7  PHOS  --   --  1.6*  --    GFR: Estimated Creatinine Clearance: 81.4 mL/min (by C-G formula based on SCr of 1.11 mg/dL). Liver Function Tests: Recent Labs  Lab 07/17/21 1032 07/18/21 0550 07/19/21 0431  AST 19 97* 90*  ALT 17 45* 48*  ALKPHOS 49 63 55  BILITOT 0.9 1.7* 1.6*  PROT 6.9 5.9* 5.6*  ALBUMIN 3.6 3.0* 2.5*   No results for input(s): LIPASE, AMYLASE in the last 168 hours. No results for input(s): AMMONIA in the last 168 hours. Coagulation Profile: No results for input(s): INR, PROTIME in the last 168 hours. Cardiac Enzymes: No results for input(s):  CKTOTAL, CKMB, CKMBINDEX, TROPONINI in the last 168 hours. BNP (last 3 results) No results for input(s): PROBNP in the last 8760 hours. HbA1C: No results for input(s): HGBA1C in the last 72 hours. CBG: Recent Labs  Lab 07/18/21 1545 07/18/21 1943 07/18/21 2340 07/19/21 0351 07/19/21 0740  GLUCAP 245* 225* 175* 122* 115*   Lipid Profile: No results for input(s): CHOL, HDL, LDLCALC, TRIG, CHOLHDL, LDLDIRECT in the last 72 hours. Thyroid Function Tests: Recent Labs    07/17/21 1031  TSH 0.053*   Anemia Panel: Recent Labs    07/18/21 0745  VITAMINB12 415  FOLATE 5.1*   Sepsis Labs: Recent Labs  Lab 07/18/21 0745 07/18/21 1047 07/18/21 1357 07/18/21 1621 07/19/21 0431  PROCALCITON 55.58  --   --   --  39.86  LATICACIDVEN  2.7* 3.1* 3.3* 3.4* 3.3*    Recent Results (from the past 240 hour(s))  Urine Culture     Status: Abnormal (Preliminary result)   Collection Time: 07/17/21  3:46 PM   Specimen: Urine, Clean Catch  Result Value Ref Range Status   Specimen Description   Final    URINE, CLEAN CATCH Performed at [facility], [address], [place], [place] [address]    Special Requests   Final    NONE Performed at [facility], [address], [place], [place] [address]    Culture (A)  Final    >=100,000 COLONIES/mL GRAM NEGATIVE RODS SUSCEPTIBILITIES TO FOLLOW Performed at [facility], [place] [address] [place], [place] [address]    Report Status PENDING  Incomplete  Resp Panel by RT-PCR (Flu A&B, Covid) Nasopharyngeal Swab     Status: None   Collection Time: 07/17/21  4:37 PM   Specimen: Nasopharyngeal Swab; Nasopharyngeal(NP) swabs in vial transport medium  Result Value Ref Range Status   SARS Coronavirus 2 by RT PCR NEGATIVE NEGATIVE Final    Comment: (NOTE) SARS-CoV-2 target nucleic acids are NOT DETECTED.  The SARS-CoV-2 RNA is generally detectable in upper respiratory specimens during the acute phase of infection. The lowest concentration of  SARS-CoV-2 viral copies this assay can detect is 138 copies/mL. A negative result does not preclude SARS-Cov-2 infection and should not be used as the sole basis for treatment or other patient management decisions. A negative result may occur with  improper specimen collection/handling, submission of specimen other than nasopharyngeal swab, presence of viral mutation(s) within the areas targeted by this assay, and inadequate number of viral copies(<138 copies/mL). A negative result must be combined with clinical observations, patient history, and epidemiological information. The expected result is Negative.  Fact Sheet for Patients:  EntrepreneurPulse.com.au  Fact Sheet for Healthcare Providers:  IncredibleEmployment.be  This test is no t yet approved or cleared by the [place] FDA and  has been authorized for detection and/or diagnosis of SARS-CoV-2 by FDA under an Emergency Use Authorization (EUA). This EUA will remain  in effect (meaning this test can be used) for the duration of the COVID-19 declaration under Section 564(b)(1) of the Act, 21 U.S.C.section 360bbb-3(b)(1), unless the authorization is terminated  or revoked sooner.       Influenza A by PCR NEGATIVE NEGATIVE Final   Influenza B by PCR NEGATIVE NEGATIVE Final    Comment: (NOTE) The Xpert Xpress SARS-CoV-2/FLU/RSV plus assay is intended as an aid in the diagnosis of influenza from Nasopharyngeal swab specimens and should not be used as a sole basis for treatment. Nasal washings and aspirates are unacceptable for Xpert Xpress SARS-CoV-2/FLU/RSV testing.  Fact Sheet for Patients: EntrepreneurPulse.com.au  Fact Sheet for Healthcare Providers: IncredibleEmployment.be  This test is not yet approved or cleared by the [place] FDA and has been authorized for detection and/or diagnosis of SARS-CoV-2 by FDA under an Emergency Use  Authorization (EUA). This EUA will remain in effect (meaning this test can be used) for the duration of the COVID-19 declaration under Section 564(b)(1) of the Act, 21 U.S.C. section 360bbb-3(b)(1), unless the authorization is terminated or revoked.  Performed at [facility], [address] [place], [place] [address]   Blood culture (routine x 2)     Status: None (Preliminary result)   Collection Time: 07/17/21  6:34 PM   Specimen: Right Antecubital; Blood  Result Value Ref Range Status   Specimen Description   Final  RIGHT ANTECUBITAL BOTTLES DRAWN AEROBIC AND ANAEROBIC   Special Requests Blood Culture adequate volume  Final   Culture   Final    NO GROWTH 2 DAYS Performed at [facility], [address] [place], [place] [address]    Report Status PENDING  Incomplete  Blood culture (routine x 2)     Status: None (Preliminary result)   Collection Time: 07/17/21  6:41 PM   Specimen: Left Antecubital; Blood  Result Value Ref Range Status   Specimen Description   Final    LEFT ANTECUBITAL BOTTLES DRAWN AEROBIC AND ANAEROBIC   Special Requests   Final    Blood Culture results may not be optimal due to an excessive volume of blood received in culture bottles   Culture   Final    NO GROWTH 2 DAYS Performed at [facility], [address] [place], [place] [address]    Report Status PENDING  Incomplete  MRSA Next Gen by PCR, Nasal     Status: None   Collection Time: 07/18/21  5:32 AM   Specimen: Nasal Mucosa; Nasal Swab  Result Value Ref Range Status   MRSA by PCR Next Gen NOT DETECTED NOT DETECTED Final    Comment: (NOTE) The GeneXpert MRSA Assay (FDA approved for NASAL specimens only), is one component of a comprehensive MRSA colonization surveillance program. It is not intended to diagnose MRSA infection nor to guide or monitor treatment for MRSA infections. Test performance is not FDA approved in patients less than 72 years old. Performed at [facility], [address] [place], [place] [address]          Radiology Studies: DG Chest 1 View  Result Date: 07/18/2021 CLINICAL DATA:  Short of breath. Intubated patient. Follow-up exam. EXAM: CHEST  1 VIEW COMPARISON:  07/18/2021 at 4:35 a.m. FINDINGS: Mild medial lung base opacities noted consistent with atelectasis. Suspect small effusions. Remainder of the lungs is clear. Endotracheal tube, nasal/orogastric tube and right internal jugular central venous line are stable and well positioned. No pneumothorax. IMPRESSION: 1. No change from the exam obtained earlier today. 2. Mild lung base opacities consistent atelectasis, likely with small effusions. No convincing pneumonia and no evidence of pulmonary edema. 3. Support apparatus is stable and well positioned. Electronically Signed   By: Lajean Manes M.D.   On: 07/18/2021 09:32   CT ABDOMEN PELVIS W CONTRAST  Result Date: 07/17/2021 CLINICAL DATA:  Chronic rectal and penile pain. Weight loss and fatigue. EXAM: CT ABDOMEN AND PELVIS WITH CONTRAST TECHNIQUE: Multidetector CT imaging of the abdomen and pelvis was performed using the standard protocol following bolus administration of intravenous contrast. CONTRAST:  172m OMNIPAQUE IOHEXOL 300 MG/ML  SOLN COMPARISON:  04/23/2021 FINDINGS: Lower chest: No acute abnormality. Hepatobiliary: Liver normal in size. Subcentimeter low-density lesion, anterior aspect of the lateral segment of the left lobe, consistent with a cyst. No other liver masses or lesions. Normal gallbladder. No bile duct dilation. Pancreas: Unremarkable. No pancreatic ductal dilatation or surrounding inflammatory changes. Spleen: Normal in size without focal abnormality. Adrenals/Urinary Tract: Small adrenal nodules, both 11 mm, stable. Kidneys normal in overall size, orientation and position. No renal masses. Small bilateral intrarenal stones. No hydronephrosis. Stones noted in the distal right ureter at and just above the ureterovesicular junction, stable  from the prior CT. This causes no signs of obstruction. Normal left ureter. Bladder wall is irregular with several cellule. No bladder mass or stone. Stomach/Bowel: Stomach is unremarkable. Small bowel and colon are normal in  caliber. No wall thickening. No inflammation. Normal appendix visualized. Vascular/Lymphatic: Aortic atherosclerosis. No aneurysm. Scattered subcentimeter retroperitoneal lymph nodes. No enlarged lymph nodes. Reproductive: Findings consistent with a previous TURP. Densities within the prostate consistent with radiation therapy seeds. These findings are stable. Other: No abdominal wall hernia or abnormality. No abdominopelvic ascites. Musculoskeletal: No fracture or acute finding. No aggressive bone lesion. Previous posterior lumbar spine fusion, L3 through L5. Advanced degenerative changes throughout the visualized spine. IMPRESSION: 1. No acute findings within the abdomen or pelvis. 2. Stones in the distal right ureter without findings of obstruction, and unchanged compared to the prior CT. Small nonobstructing intrarenal stones. 3. 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 4. Aortic atherosclerosis. Electronically Signed   By: Lajean Manes M.D.   On: 07/17/2021 13:02   DG Chest Port 1 View  Result Date: 07/18/2021 CLINICAL DATA:  Central line placement. EXAM: PORTABLE CHEST 1 VIEW COMPARISON:  None. FINDINGS: A right-sided venous catheter is seen. Its distal tip is noted at the junction of the superior vena cava and right atrium. Low lung volumes are present with mild, diffuse, chronic appearing increased interstitial lung markings. There is no evidence of acute infiltrate, pleural effusion or pneumothorax. Multiple sternal wires are seen. The heart size and mediastinal contours are within normal limits. Multilevel degenerative changes are noted throughout the thoracic spine. IMPRESSION: 1. Right-sided venous catheter positioning, as  described above. 2. Evidence of prior median sternotomy. Electronically Signed   By: Virgina Norfolk M.D.   On: 07/18/2021 00:51   DG Chest Port 1V same Day  Result Date: 07/18/2021 CLINICAL DATA:  Check intubation, OG tube placement. Respiratory failure. EXAM: PORTABLE CHEST 1 VIEW COMPARISON:  Portable chest earlier today at 12:36 a.m. FINDINGS: ETT is in place with tip 3.6 cm from the carina. Enteric tube has its tip in the distal body of stomach based on position of the proximal side hole with the tip not included in the exam. A right IJ central line is stable in position with tip just above the cavoatrial junction. There are intact median sternotomy sutures and mild cardiomegaly. No vascular congestion is seen. There is increased left infrahilar lower lobe opacity consistent with atelectasis, pneumonia or aspiration. No other focal lung infiltrate is seen. There are trace pleural effusions as seen previously. Thoracic spondylosis. IMPRESSION: 1. Support tubes are adequately inserted, stable positioning right IJ line. 2. Trace pleural effusions, with increased opacity in the left lower lobe consistent with atelectasis, pneumonia or aspiration. Electronically Signed   By: Telford Nab M.D.   On: 07/18/2021 04:58   DG ABD ACUTE 2+V W 1V CHEST  Result Date: 07/17/2021 CLINICAL DATA:  Weakness, constipation. EXAM: DG ABDOMEN ACUTE WITH 1 VIEW CHEST COMPARISON:  CT abdomen pelvis 04/23/2021 FINDINGS: There is no evidence of dilated bowel loops or free intraperitoneal air. Thin linear radiopaque calculi projecting in the medial left abdomen correspond to vascular calcifications seen on prior CT 04/23/2021. Surgical clips project at the low central pelvis from prior TURP. Bilateral pedicle screw and rod fixation spans L3-L5 with intervertebral spacers. Severe degenerative disc disease at L1-L2 and L2-L3. Postoperative changes of median sternotomy. Heart size and mediastinal contours are within normal  limits. No focal consolidation, pleural effusion, or pneumothorax. Subsegmental bibasilar atelectasis. IMPRESSION: Nonobstructive bowel gas pattern on abdominal radiographs. No acute cardiopulmonary abnormality. Electronically Signed   By: Ileana Roup M.D.   On: 07/17/2021 11:35        Scheduled Meds:  chlorhexidine gluconate (MEDLINE KIT)  15 mL Mouth Rinse BID   Chlorhexidine Gluconate Cloth  6 each Topical Daily   enoxaparin (LOVENOX) injection  40 mg Subcutaneous Q24H   [START ON 07/21/2021] levothyroxine  68.5 mcg Intravenous Daily   mouth rinse  15 mL Mouth Rinse 10 times per day   pantoprazole (PROTONIX) IV  40 mg Intravenous Q24H   potassium & sodium phosphates  1 packet Per NG tube TID   Continuous Infusions:  cefTRIAXone (ROCEPHIN)  IV Stopped (07/19/21 2683)   lactated ringers 150 mL/hr at 07/19/21 0907   norepinephrine (LEVOPHED) Adult infusion 8 mcg/min (07/19/21 0907)   propofol (DIPRIVAN) infusion 35 mcg/kg/min (07/19/21 0907)   propofol       LOS: 1 day    Critical care time: 60 minutes.    Ashyah Quizon Darleen Crocker, DO [facility]  If 7PM-7AM, please contact night-coverage [email or web address] 07/19/2021, 9:24 AM

## 2021-07-19 NOTE — Progress Notes (Signed)
Initial Nutrition Assessment  DOCUMENTATION CODES:   Obesity unspecified  INTERVENTION:  Initiate Vital High Protein @ 40 ml/hr via OGT.(960 ml)  Add 90 ml ProSource TF- QID  per tube .    Tube feeding regimen provides 1280 kcal, 171 grams of protein, and  ml of H2O.    Sedation adds -686 kcal.   NUTRITION DIAGNOSIS:   Inadequate oral intake related to inability to eat as evidenced by NPO status.   GOAL:  Provide needs based on ASPEN/SCCM guidelines   MONITOR:  Vent status, Labs, TF tolerance, Weight trends, I & O's  REASON FOR ASSESSMENT:   Consult- Assess nutrition status and Enteral/tube feeding initiation and management  ASSESSMENT: Patient is a 75 yo male with sepsis secondary to UTI.   Patient  intubated 12/18 and on ventilator support. Per pulmonology pt may be ready to wean in next 24-48 hr. He is requiring pressors. MAP- 70 . BP 113/48, afebrile.   Discussed with nursing and talked with pt son-in-law. Patient daughter is a Data processing manager.  MV: 10.9 L/min Temp (24hrs), Avg:100.4 F (38 C), Min:97.6 F (36.4 C), Max:103.1 F (39.5 C)  Propofol: 26 ml/hr- provides 686 kcal lipids q 24 hr.   Medications: Potassium and sodium phosphates TID per tube.  Weights: currently 132.5 kg and 04/23/21 127 kg.   IV antibiotic. IVF- Lactated ringers @150  ml/hr    BMP Latest Ref Rng & Units 07/19/2021 07/18/2021 07/17/2021  Glucose 70 - 99 mg/dL 07/19/2021) 798(X) 211(H)  BUN 8 - 23 mg/dL 23 17 17   Creatinine 0.61 - 1.24 mg/dL 417(E ) 0.81  Sodium 135 - 145 mmol/L 134(L) 137 138  Potassium 3.5 - 5.1 mmol/L 3.3(L) 2.4(LL) 3.0(L)  Chloride 98 - 111 mmol/L 104 107 103  CO2 22 - 32 mmol/L 19(L) 18(L) 26  Calcium 8.9 - 10.3 mg/dL 8.0(L) 8.3(L) 8.9     Diet Order:   Diet Order             Diet NPO time specified  Diet effective now                   EDUCATION NEEDS:  Not appropriate for education at this time  Skin:  Skin Assessment: Reviewed RN  Assessment  Last BM:  prior to admission  Height:   Ht Readings from Last 1 Encounters:  07/18/21 6\' 4"  (1.93 m)    Weight:   Wt Readings from Last 1 Encounters:  07/19/21 132.5 kg    Ideal Body Weight:   92 kg  BMI:  Body mass index is 35.56 kg/m.  Estimated Nutritional Needs:   Kcal:  1455-1855  Protein:  184 gr  Fluid:  > 2 liters daily  07/20/21 MS,RD,CSG,LDN Contact: 

## 2021-07-20 ENCOUNTER — Inpatient Hospital Stay (HOSPITAL_COMMUNITY): Payer: No Typology Code available for payment source

## 2021-07-20 DIAGNOSIS — A419 Sepsis, unspecified organism: Secondary | ICD-10-CM | POA: Diagnosis not present

## 2021-07-20 DIAGNOSIS — J9601 Acute respiratory failure with hypoxia: Secondary | ICD-10-CM | POA: Diagnosis not present

## 2021-07-20 DIAGNOSIS — N390 Urinary tract infection, site not specified: Secondary | ICD-10-CM | POA: Diagnosis not present

## 2021-07-20 LAB — COMPREHENSIVE METABOLIC PANEL
ALT: 39 U/L (ref 0–44)
AST: 57 U/L — ABNORMAL HIGH (ref 15–41)
Albumin: 2.3 g/dL — ABNORMAL LOW (ref 3.5–5.0)
Alkaline Phosphatase: 54 U/L (ref 38–126)
Anion gap: 7 (ref 5–15)
BUN: 29 mg/dL — ABNORMAL HIGH (ref 8–23)
CO2: 22 mmol/L (ref 22–32)
Calcium: 7.8 mg/dL — ABNORMAL LOW (ref 8.9–10.3)
Chloride: 106 mmol/L (ref 98–111)
Creatinine, Ser: 1 mg/dL (ref 0.61–1.24)
GFR, Estimated: >60 mL/min (ref >60)
Glucose, Bld: 136 mg/dL — ABNORMAL HIGH (ref 70–99)
Potassium: 3.1 mmol/L — ABNORMAL LOW (ref 3.5–5.1)
Sodium: 135 mmol/L (ref 135–145)
Total Bilirubin: 1.6 mg/dL — ABNORMAL HIGH (ref 0.3–1.2)
Total Protein: 5.1 g/dL — ABNORMAL LOW (ref 6.5–8.1)

## 2021-07-20 LAB — CBC
HCT: 31.6 % — ABNORMAL LOW (ref 39.0–52.0)
Hemoglobin: 10.2 g/dL — ABNORMAL LOW (ref 13.0–17.0)
MCH: 34.5 pg — ABNORMAL HIGH (ref 26.0–34.0)
MCHC: 32.3 g/dL (ref 30.0–36.0)
MCV: 106.8 fL — ABNORMAL HIGH (ref 80.0–100.0)
Platelets: 82 10*3/uL — ABNORMAL LOW (ref 150–400)
RBC: 2.96 MIL/uL — ABNORMAL LOW (ref 4.22–5.81)
RDW: 12.4 % (ref 11.5–15.5)
WBC: 13.9 10*3/uL — ABNORMAL HIGH (ref 4.0–10.5)
nRBC: 0 % (ref 0.0–0.2)

## 2021-07-20 LAB — URINE CULTURE: Culture: >=100000 — AB

## 2021-07-20 LAB — GLUCOSE, CAPILLARY
Glucose-Capillary: 132 mg/dL — ABNORMAL HIGH (ref 70–99)
Glucose-Capillary: 119 mg/dL — ABNORMAL HIGH (ref 70–99)
Glucose-Capillary: 126 mg/dL — ABNORMAL HIGH (ref 70–99)
Glucose-Capillary: 92 mg/dL (ref 70–99)
Glucose-Capillary: 96 mg/dL (ref 70–99)
Glucose-Capillary: 84 mg/dL (ref 70–99)

## 2021-07-20 LAB — TRIGLYCERIDES: Triglycerides: 562 mg/dL — ABNORMAL HIGH (ref <150)

## 2021-07-20 LAB — MAGNESIUM: Magnesium: 2.1 mg/dL (ref 1.7–2.4)

## 2021-07-20 LAB — PHOSPHORUS: Phosphorus: 2.9 mg/dL (ref 2.5–4.6)

## 2021-07-20 LAB — PROCALCITONIN: Procalcitonin: 25 ng/mL

## 2021-07-20 LAB — LACTIC ACID, PLASMA: Lactic Acid, Venous: 2.3 mmol/L (ref 0.5–1.9)

## 2021-07-20 MED ORDER — ORAL CARE MOUTH RINSE
15.0000 mL | Freq: Two times a day (BID) | OROMUCOSAL | Status: DC
  Administered 2021-07-20 – 2021-07-29 (×19): 15 mL via OROMUCOSAL

## 2021-07-20 MED ORDER — ACETAMINOPHEN 325 MG PO TABS
650.0000 mg | ORAL_TABLET | Freq: Four times a day (QID) | ORAL | Status: DC | PRN
  Administered 2021-07-23 – 2021-07-25 (×3): 650 mg via ORAL
  Filled 2021-07-20 (×3): qty 2

## 2021-07-20 MED ORDER — POTASSIUM CHLORIDE 20 MEQ PO PACK
40.0000 meq | PACK | Freq: Once | ORAL | Status: AC
  Administered 2021-07-20: 06:00:00 40 meq
  Filled 2021-07-20: qty 2

## 2021-07-20 MED ORDER — POTASSIUM CHLORIDE CRYS ER 20 MEQ PO TBCR
40.0000 meq | EXTENDED_RELEASE_TABLET | Freq: Once | ORAL | Status: DC
Start: 2021-07-20 — End: 2021-07-20

## 2021-07-20 MED ORDER — KETOROLAC TROMETHAMINE 15 MG/ML IJ SOLN
15.0000 mg | Freq: Four times a day (QID) | INTRAMUSCULAR | Status: DC | PRN
  Administered 2021-07-20 – 2021-07-23 (×4): 15 mg via INTRAVENOUS
  Filled 2021-07-20 (×5): qty 1

## 2021-07-20 MED ORDER — BISACODYL 10 MG RE SUPP
10.0000 mg | Freq: Once | RECTAL | Status: AC
  Administered 2021-07-20: 15:00:00 10 mg via RECTAL
  Filled 2021-07-20: qty 1

## 2021-07-20 MED ORDER — LEVOTHYROXINE SODIUM 137 MCG PO TABS
137.0000 ug | ORAL_TABLET | Freq: Every day | ORAL | Status: DC
  Administered 2021-07-23 – 2021-07-29 (×7): 137 ug via ORAL
  Filled 2021-07-20 (×7): qty 1

## 2021-07-20 NOTE — Progress Notes (Signed)
Patient flipped to wean mode at [facility] 10 PEEP 5 and is tolerating well at this time. Will continue to monitor as needed.

## 2021-07-20 NOTE — Progress Notes (Signed)
°  Transition of Care [facility]) Screening Note   Patient Details  Name: Patrick Kerr Date of Birth: Dec 06, 1945   Transition of Care [facility]) CM/SW Contact:    Annice Needy, LCSW Phone Number: 07/20/2021, 3:22 PM    Transition of Care Department [facility]) has reviewed patient and no TOC needs have been identified at this time. We will continue to monitor patient advancement through interdisciplinary progression rounds. If new patient transition needs arise, please place a TOC consult.

## 2021-07-20 NOTE — Plan of Care (Signed)

## 2021-07-20 NOTE — Progress Notes (Signed)
PROGRESS NOTE    Patrick Kerr  AOZ:[id number] DOB: 1945-12-15 DOA: 07/17/2021 PCP: [facility]   Brief Narrative:   Patrick Kerr is a 75 y.o. male with medical history significant for hypertension, BPH with history of TURP, hypothyroidism who presents to the emergency department accompanied by daughter due to weakness.  Patient was unable to provide history, history was obtained from ED physician and daughter at bedside.  Per report, patient recently had circumcision in October 2022 due to recent development of phimosis, patient did complain of burning penile sensation prior to and after circumcision.  He was prescribed a 7-day course of doxycycline followed with Macrobid for possible prostate infection through the [place] system and had urine culture demonstrating Klebsiella that was sensitive to Rocephin.  He continued to have ongoing weakness despite use of antibiotics.  He was admitted with septic shock present on admission secondary to Klebsiella UTI.  His sepsis physiology is now improving and he has been extubated on 12/28.  Assessment & Plan:   Principal Problem:   Sepsis secondary to UTI [facility]) Active Problems:   Macrocytic anemia   Hypokalemia   Thrombocytopenia (HCC)   Bladder outlet obstruction   Low TSH level   Lactic acidosis   Obesity (BMI 30.0-34.9)   Essential hypertension   BPH (benign prostatic hyperplasia)   Acquired hypothyroidism   Septic shock secondary to UTI-Klebsiella, present on admission -Norepinephrine being weaned -Continue IV Rocephin at 2 g dose daily with resolution of fever day 4/7 -Blood cultures with no growth to date -Lactic acid remains elevated and procalcitonin downtrending -Continue IV fluid -Dietitian consultation for tube feeding   Urinary retention related to above-acute -Status post coud catheter placement -Would recommend discharge with catheter and urology follow-up outpatient given complications with placement of catheter  as well as recent circumcision procedure   Intractable nausea and vomiting with hypotension related to above factors -Patient intubated for airway protection -Remains on mechanical ventilation for support given current instability related to sepsis -Appreciate PCCM evaluation today   Hypokalemia -Replete and continue monitoring   Prolonged QTC -Avoid QT prolonging medications and supplement electrolytes   Thrombocytopenia-downtrending -Continue monitoring   Essential hypertension -Hold BP medications due to ongoing hypotension   Hypothyroidism -Started on IV Synthroid   BPH -Hold Flomax due to low blood pressure readings     DVT prophylaxis: SCDs Code Status: Full Family Communication: Spoke with daughter at bedside 12/20 Disposition Plan:  Status is: Inpatient   Remains inpatient appropriate because: Critical illness with need for IV antibiotics and intubation.   Consultants:  PCCM   Procedures:  12/18 intubation 12/20 extubation  Antimicrobials:  Anti-infectives (From admission, onward)    Start     Dose/Rate Route Frequency Ordered Stop   07/19/21 0700  cefTRIAXone (ROCEPHIN) 2 g in sodium chloride 0.9 % 100 mL IVPB        2 g 200 mL/hr over 30 Minutes Intravenous Every 24 hours 07/18/21 1157     07/18/21 0800  cefTRIAXone (ROCEPHIN) 2 g in sodium chloride 0.9 % 100 mL IVPB  Status:  Discontinued        2 g 200 mL/hr over 30 Minutes Intravenous Every 24 hours 07/18/21 0711 07/18/21 1106   07/17/21 1645  cefTRIAXone (ROCEPHIN) 1 g in sodium chloride 0.9 % 100 mL IVPB        1 g 200 mL/hr over 30 Minutes Intravenous  Once 07/17/21 1632 07/17/21 1835   07/17/21 0000  cephALEXin (KEFLEX) 500  MG capsule        500 mg Oral 4 times daily 07/17/21 1736         Subjective: Patient seen and evaluated today and was sedated on the ventilator this AM.  He has not been extubated to nasal cannula oxygen and is still not fully awake.  Objective: Vitals:   07/20/21  1100 07/20/21 1115 07/20/21 1125 07/20/21 1130  BP: (!) 139/59 (!) 120/51  120/60  Pulse: (!) 125 (!) 112  (!) 116  Resp: (!) 25 (!) 24  (!) 23  Temp:      TempSrc:      SpO2: 92% 93% 92% 93%  Weight:      Height:        Intake/Output Summary (Last 24 hours) at 07/20/2021 1158 Last data filed at 07/20/2021 1142 Gross per 24 hour  Intake 3453.2 ml  Output 2050 ml  Net 1403.2 ml   Filed Weights   07/18/21 0600 07/19/21 0900 07/20/21 0332  Weight: 123.9 kg 132.5 kg 130.8 kg    Examination:  General exam: Appears calm and comfortable  Respiratory system: Clear to auscultation. Respiratory effort normal.  Currently with nasal cannula Cardiovascular system: S1 & S2 heard, RRR.  Gastrointestinal system: Abdomen is soft Central nervous system: Intermittently awake and under the effects of sedation Extremities: No edema Skin: No significant lesions noted Psychiatry: Flat affect. Foley with reddish urine output    Data Reviewed: I have personally reviewed following labs and imaging studies  CBC: Recent Labs  Lab 07/17/21 1032 07/18/21 0550 07/19/21 0431 07/20/21 0430  WBC 5.3 12.5* 19.3* 13.9*  NEUTROABS 3.4  --   --   --   HGB 11.5* 11.5* 11.6* 10.2*  HCT 34.7* 34.5* 35.4* 31.6*  MCV 104.8* 106.5* 104.7* 106.8*  PLT 140* 124* 95* 82*   Basic Metabolic Panel: Recent Labs  Lab 07/17/21 1032 07/17/21 1834 07/18/21 0550 07/19/21 0431 07/20/21 0430  NA 138  --  137 134* 135  K 3.0*  --  2.4* 3.3* 3.1*  CL 103  --  107 104 106  CO2 26  --  18* 19* 22  GLUCOSE 112*  --  196* 126* 136*  BUN 17  --  17 23 29*  CREATININE 0.95  --  1.35* 1.11 1.00  CALCIUM 8.9  --  8.3* 8.0* 7.8*  MG  --  1.2* 1.5* 1.7 2.1  PHOS  --   --  1.6*  --  2.9   GFR: Estimated Creatinine Clearance: 92.8 mL/min (by C-G formula based on SCr of 1 mg/dL). Liver Function Tests: Recent Labs  Lab 07/17/21 1032 07/18/21 0550 07/19/21 0431 07/20/21 0430  AST 19 97* 90* 57*  ALT 17 45*  48* 39  ALKPHOS 49 63 55 54  BILITOT 0.9 1.7* 1.6* 1.6*  PROT 6.9 5.9* 5.6* 5.1*  ALBUMIN 3.6 3.0* 2.5* 2.3*   No results for input(s): LIPASE, AMYLASE in the last 168 hours. No results for input(s): AMMONIA in the last 168 hours. Coagulation Profile: No results for input(s): INR, PROTIME in the last 168 hours. Cardiac Enzymes: No results for input(s): CKTOTAL, CKMB, CKMBINDEX, TROPONINI in the last 168 hours. BNP (last 3 results) No results for input(s): PROBNP in the last 8760 hours. HbA1C: No results for input(s): HGBA1C in the last 72 hours. CBG: Recent Labs  Lab 07/19/21 1921 07/19/21 2310 07/20/21 0324 07/20/21 0800 07/20/21 1123  GLUCAP 124* 131* 132* 119* 126*   Lipid Profile: Recent Labs  07/20/21 0430  TRIG 562*   Thyroid Function Tests: No results for input(s): TSH, T4TOTAL, FREET4, T3FREE, THYROIDAB in the last 72 hours. Anemia Panel: Recent Labs    07/18/21 0745  VITAMINB12 415  FOLATE 5.1*   Sepsis Labs: Recent Labs  Lab 07/18/21 0745 07/18/21 1047 07/18/21 1357 07/18/21 1621 07/19/21 0431 07/20/21 0430  PROCALCITON 55.58  --   --   --  39.86 25.00  LATICACIDVEN 2.7*   < > 3.3* 3.4* 3.3* 2.3*   < > = values in this interval not displayed.    Recent Results (from the past 240 hour(s))  Urine Culture     Status: Abnormal   Collection Time: 07/17/21  3:46 PM   Specimen: Urine, Clean Catch  Result Value Ref Range Status   Specimen Description   Final    URINE, CLEAN CATCH Performed at [facility], [address] [place], [place] [address]    Special Requests   Final    NONE Performed at [facility], [address], [place], [place] [address]    Culture >=100,000 COLONIES/mL KLEBSIELLA PNEUMONIAE (A)  Final   Report Status 07/20/2021 FINAL  Final   Organism ID, Bacteria KLEBSIELLA PNEUMONIAE (A)  Final      Susceptibility   Klebsiella pneumoniae - MIC*    AMPICILLIN >=32 RESISTANT Resistant     CEFAZOLIN <=4 SENSITIVE Sensitive      CEFEPIME <=0.12 SENSITIVE Sensitive     CEFTRIAXONE <=0.25 SENSITIVE Sensitive     CIPROFLOXACIN <=0.25 SENSITIVE Sensitive     GENTAMICIN <=1 SENSITIVE Sensitive     IMIPENEM <=0.25 SENSITIVE Sensitive     NITROFURANTOIN 64 INTERMEDIATE Intermediate     TRIMETH/SULFA <=20 SENSITIVE Sensitive     AMPICILLIN/SULBACTAM 16 INTERMEDIATE Intermediate     PIP/TAZO 16 SENSITIVE Sensitive     * >=100,000 COLONIES/mL KLEBSIELLA PNEUMONIAE  Resp Panel by RT-PCR (Flu A&B, Covid) Nasopharyngeal Swab     Status: None   Collection Time: 07/17/21  4:37 PM   Specimen: Nasopharyngeal Swab; Nasopharyngeal(NP) swabs in vial transport medium  Result Value Ref Range Status   SARS Coronavirus 2 by RT PCR NEGATIVE NEGATIVE Final    Comment: (NOTE) SARS-CoV-2 target nucleic acids are NOT DETECTED.  The SARS-CoV-2 RNA is generally detectable in upper respiratory specimens during the acute phase of infection. The lowest concentration of SARS-CoV-2 viral copies this assay can detect is 138 copies/mL. A negative result does not preclude SARS-Cov-2 infection and should not be used as the sole basis for treatment or other patient management decisions. A negative result may occur with  improper specimen collection/handling, submission of specimen other than nasopharyngeal swab, presence of viral mutation(s) within the areas targeted by this assay, and inadequate number of viral copies(<138 copies/mL). A negative result must be combined with clinical observations, patient history, and epidemiological information. The expected result is Negative.  Fact Sheet for Patients:  EntrepreneurPulse.com.au  Fact Sheet for Healthcare Providers:  IncredibleEmployment.be  This test is no t yet approved or cleared by the [place] FDA and  has been authorized for detection and/or diagnosis of SARS-CoV-2 by FDA under an Emergency Use Authorization (EUA). This EUA will remain  in  effect (meaning this test can be used) for the duration of the COVID-19 declaration under Section 564(b)(1) of the Act, 21 U.S.C.section 360bbb-3(b)(1), unless the authorization is terminated  or revoked sooner.       Influenza A by PCR NEGATIVE NEGATIVE Final   Influenza B by PCR NEGATIVE  NEGATIVE Final    Comment: (NOTE) The Xpert Xpress SARS-CoV-2/FLU/RSV plus assay is intended as an aid in the diagnosis of influenza from Nasopharyngeal swab specimens and should not be used as a sole basis for treatment. Nasal washings and aspirates are unacceptable for Xpert Xpress SARS-CoV-2/FLU/RSV testing.  Fact Sheet for Patients: EntrepreneurPulse.com.au  Fact Sheet for Healthcare Providers: IncredibleEmployment.be  This test is not yet approved or cleared by the [place] FDA and has been authorized for detection and/or diagnosis of SARS-CoV-2 by FDA under an Emergency Use Authorization (EUA). This EUA will remain in effect (meaning this test can be used) for the duration of the COVID-19 declaration under Section 564(b)(1) of the Act, 21 U.S.C. section 360bbb-3(b)(1), unless the authorization is terminated or revoked.  Performed at [facility], [address] [place], [place] [address]   Blood culture (routine x 2)     Status: None (Preliminary result)   Collection Time: 07/17/21  6:34 PM   Specimen: Right Antecubital; Blood  Result Value Ref Range Status   Specimen Description   Final    RIGHT ANTECUBITAL BOTTLES DRAWN AEROBIC AND ANAEROBIC   Special Requests Blood Culture adequate volume  Final   Culture   Final    NO GROWTH 2 DAYS Performed at [facility], [address] [place], [place] [address]    Report Status PENDING  Incomplete  Blood culture (routine x 2)     Status: None (Preliminary result)   Collection Time: 07/17/21  6:41 PM   Specimen: Left Antecubital; Blood  Result Value Ref Range Status   Specimen Description   Final     LEFT ANTECUBITAL BOTTLES DRAWN AEROBIC AND ANAEROBIC   Special Requests   Final    Blood Culture results may not be optimal due to an excessive volume of blood received in culture bottles   Culture   Final    NO GROWTH 2 DAYS Performed at [facility], [address] [place], [place] [address]    Report Status PENDING  Incomplete  MRSA Next Gen by PCR, Nasal     Status: None   Collection Time: 07/18/21  5:32 AM   Specimen: Nasal Mucosa; Nasal Swab  Result Value Ref Range Status   MRSA by PCR Next Gen NOT DETECTED NOT DETECTED Final    Comment: (NOTE) The GeneXpert MRSA Assay (FDA approved for NASAL specimens only), is one component of a comprehensive MRSA colonization surveillance program. It is not intended to diagnose MRSA infection nor to guide or monitor treatment for MRSA infections. Test performance is not FDA approved in patients less than 27 years old. Performed at [facility], [address], [place], [place] [address]          Radiology Studies: [facility] Chest [facility] 1 View  Result Date: 07/20/2021 CLINICAL DATA:  Evaluate lungs EXAM: PORTABLE CHEST 1 VIEW COMPARISON:  Chest x-ray dated July 19, 2021 FINDINGS: Right IJ line, ET tube and enteric tube are unchanged in position. Increased bilateral linear opacities, likely due to worsening atelectasis. Probable small bilateral pleural effusions. No evidence of pneumothorax. IMPRESSION: Increased bilateral linear opacities, likely due to worsening atelectasis. Electronically Signed   By: Yetta Glassman M.D.   On: 07/20/2021 08:11   DG CHEST PORT 1 VIEW  Result Date: 07/19/2021 CLINICAL DATA:  ET tube. Pt on cooling mat due to febrile episodes. Hx HTN. Nonsmoker EXAM: PORTABLE CHEST - 1 VIEW COMPARISON:  the previous day's study FINDINGS: Good position of endotracheal tube 5 cm  above carina, gastric tube at least far as the stomach, and right IJ central line to distal SVC as before. Persistent left retrocardiac  consolidation/atelectasis. Relatively low lung volumes. Heart size and mediastinal contours are within normal limits. Aortic Atherosclerosis (ICD10-170.0). No effusion. Sternotomy wires. IMPRESSION: 1. Persistent left retrocardiac consolidation/atelectasis. 2. Support hardware stable in position. Electronically Signed   By: Lucrezia Europe M.D.   On: 07/19/2021 10:09        Scheduled Meds:  chlorhexidine gluconate (MEDLINE KIT)  15 mL Mouth Rinse BID   Chlorhexidine Gluconate Cloth  6 each Topical Daily   [START ON 07/21/2021] levothyroxine  137 mcg Oral Q0600   mouth rinse  15 mL Mouth Rinse 10 times per day   potassium & sodium phosphates  1 packet Per NG tube TID   Continuous Infusions:  cefTRIAXone (ROCEPHIN)  IV Stopped (07/20/21 0636)   lactated ringers 50 mL/hr at 07/20/21 1142   norepinephrine (LEVOPHED) Adult infusion Stopped (07/20/21 1129)     LOS: 2 days    Time spent: 35 minutes    Kelsea Mousel D Manuella Ghazi, DO [facility]  If 7PM-7AM, please contact night-coverage [email or web address] 07/20/2021, 11:58 AM

## 2021-07-20 NOTE — Procedures (Signed)
Extubation Procedure Note  Patient Details:   Name: Patrick Kerr DOB: 1946/07/01 MRN: [id number]   Airway Documentation:    Vent end date: (not recorded) Vent end time: (not recorded)   Evaluation  O2 sats: stable throughout Complications: No apparent complications Patient did tolerate procedure well. Bilateral Breath Sounds: Clear, Diminished   Yes  Patient extubated Per MD order without any complications. Patient has an adequate cough and was able to speak. Patient placed on 3L nasal cannula. MD and RN aware.  Sharene Skeans 07/20/2021, 11:26 AM

## 2021-07-20 NOTE — Progress Notes (Signed)
PT Cancellation Note  Patient Details Name: Patrick Kerr MRN: [id number] DOB: 02-15-1946   Cancelled Treatment:    Reason Eval/Treat Not Completed: Fatigue/lethargy limiting ability to participate;Patient not medically ready; Spoke with RN who stated to hold physical therapy eval until tomorrow as patient was just extubated a few hours ago.    2:02 PM, 07/20/21 Wyman Songster PT, DPT Physical Therapist at [facility]

## 2021-07-20 NOTE — Consult Note (Signed)
[facility]   Patient name: Patrick Kerr Admit date: 07/17/2021  DOB: 04/14/1946 LOS: 2  MRN: [id number] Consult date: 07/19/2021  Referring provider: Dr. Manuella Ghazi, [facility]: Pelvic pain    History:  75 yo male former smoker brought to [facility] ER with weakness and pelvic pain.  He had circumcision in October 2022 for phimosis, and developed dysuria after this.  He had outpt treatment with antibiotics w/o improvement.  He had foley catheter from 06/04/21 to 07/05/21.  His outpt lab work showed hypokalemia and anemia.  He was started on antibiotics for UTI and pressors for septic shock with SBP in 70's.  Found to have hypoxia with SpO2 81% on room air in ER.  He had vomiting episode and concern for aspiration, and required intubation on 07/18/21.  PCCM asked to assist with management in ICU.  Hx from chart and medical team.    Past medical history:  HTN, BPH s/p TURP, Hypothyroidism, Depression  Significant events:  12/17 Admit 12/18 intubated 12/20 extubated  Studies:  CT abd/pelvis 12/17 >> stones in distal Rt ureter, chronic bladder outlet obstruction  Micro:  Blood 12/17 >> Urine 12/17 >> Klebsiella   Lines:  Rt IJ CVL 12/18 >> ETT 12/18 >> 12/20   Antibiotics:  Rocephin 12/17 >>   Consults:      Interim history:  Remains on pressors.  Off sedation.  Tolerating pressure support.  Vital signs:  BP (!) 135/46    Pulse (!) 122    Temp 98.7 F (37.1 C) (Rectal)    Resp (!) 25    Ht 6\' 4"  (1.93 m)    Wt 130.8 kg    SpO2 (!) 89%    BMI 35.10 kg/m   Intake/output:  I/O last 3 completed shifts: In: 7425.5 [I.V.:6466; NG/GT:613.3; IV Piggyback:346.2] Out: 2825 [Urine:2825]   Physical exam:   General - pressure support Eyes - pupils reactive ENT - no sinus tenderness, no stridor Cardiac - regular rate/rhythm, no murmur Chest - equal breath sounds b/l, no wheezing or rales Abdomen - soft, non tender, + bowel  sounds Extremities - no cyanosis, clubbing, or edema Skin - no rashes Neuro - follows commands    Best practice:   DVT - SCDs SUP - protonix Nutrition - NPO   Assessment/plan:   Septic shock in setting of Klebsiella UTI, POA. - wean pressors to keep MAP >65 - day 4 of rocephin - follow up blood culture from 12/17  Acute hypoxic respiratory failure with concern for aspiration. - proceed with extubation 12/20 - goal SpO2 > [id number]  Acute metabolic encephalopathy from sepsis. Hx of depression. - uses ativan as outpt - hold outpt abilify, zoloft  Thrombocytopenia. - likely related to sepsis - f/u CBC  Hypokalemia. - f/u BMET  Hypothyroidism. - continue synthroid  Prolonged Qtc. - monitor on tele   Resolved hospital problems:  Lactic acidosis  Goals of care/Family discussions:  Code status: full  Updated daughter at bedside.  Labs:   CMP Latest Ref Rng & Units 07/20/2021 07/19/2021 07/18/2021  Glucose 70 - 99 mg/dL 136(H) 126(H) 196(H)  BUN 8 - 23 mg/dL 29(H) 23 17  Creatinine 0.61 - 1.24 mg/dL 1.00 1.11 1.35(H)  Sodium 135 - 145 mmol/L 135 134(L) 137  Potassium 3.5 - 5.1 mmol/L 3.1(L) 3.3(L) 2.4(LL)  Chloride 98 - 111 mmol/L 106 104 107  CO2 22 - 32 mmol/L 22 19(L) 18(L)  Calcium 8.9 - 10.3 mg/dL 7.8(L) 8.0(L) 8.3(L)  Total Protein 6.5 - 8.1 g/dL 5.1(L) 5.6(L) 5.9(L)  Total Bilirubin 0.3 - 1.2 mg/dL 5.4(M) 0.8(Q) 7.6(P)  Alkaline Phos 38 - 126 U/L 54 55 63  AST 15 - 41 U/L 57(H) 90(H) 97(H)  ALT 0 - 44 U/L 39 48(H) 45(H)    CBC Latest Ref Rng & Units 07/20/2021 07/19/2021 07/18/2021  WBC 4.0 - 10.5 K/uL 13.9(H) 19.3(H) 12.5(H)  Hemoglobin 13.0 - 17.0 g/dL 10.2(L) 11.6(L) 11.5(L)  Hematocrit 39.0 - 52.0 % 31.6(L) 35.4(L) 34.5(L)  Platelets 150 - 400 K/uL 82(L) 95(L) 124(L)    ABG    Component Value Date/Time   PHART 7.414 07/19/2021 0530   PCO2ART 30.3 (L) 07/19/2021 0530   PO2ART 95.3 07/19/2021 0530   HCO3 20.9 07/19/2021 0530   ACIDBASEDEF  4.8 (H) 07/19/2021 0530   O2SAT 96.2 07/19/2021 0530    CBG (last 3)  Recent Labs    07/19/21 2310 07/20/21 0324 07/20/21 0800  GLUCAP 131* 132* 119*    Critical care time: 32 minutes  Coralyn Helling, MD [facility] Pager - [phone] 07/20/2021, 11:14 AM

## 2021-07-20 NOTE — Progress Notes (Signed)
Nutrition Follow-up - Change in status  DOCUMENTATION CODES:   Obesity unspecified  INTERVENTION:  RD will follow for diet advancement.  Recommend when pt is cleared start diet: -Ensure Max BID (protein supplement)   NUTRITION DIAGNOSIS:   Inadequate oral intake related to inability to eat as evidenced by NPO status.  -progressing off vent now  GOAL:   Provide needs based on ASPEN/SCCM guidelines  -ongoing  MONITOR:   Vent status, Labs, TF tolerance, Weight trends, I & O's   ASSESSMENT:  12/19 Patient is a 75 yo male with sepsis secondary to UTI.     Patient  intubated 12/18 and on ventilator support. Per pulmonology pt may be ready to wean in next 24-48 hr. He is requiring pressors. MAP- 70 . BP 113/48, afebrile. VHP@40ml /hr +PS-TF 90 QID. Propofol @ 26 ml/hr.  12/20-Pt extubated before lunch and tolerated well per RRT. He remains NPO at this time. RD will continue to follow. Nutrition needs re-assessed due to change in status.  Medications reviewed. IVF-Lactated Ringers @ 50 ml/hr.    Intake/Output Summary (Last 24 hours) at 07/20/2021 1355 Last data filed at 07/20/2021 1142 Gross per 24 hour  Intake 3269.4 ml  Output 2050 ml  Net 1219.4 ml   Since admission- +8.055 liters  Labs: BMP Latest Ref Rng & Units 07/20/2021 07/19/2021 07/18/2021  Glucose 70 - 99 mg/dL 188(C) 166(A) 630(Z)  BUN 8 - 23 mg/dL 60(F) 23 17  Creatinine 0.61 - 1.24 mg/dL 0.93 2.35 5.73(U)  Sodium 135 - 145 mmol/L 135 134(L) 137  Potassium 3.5 - 5.1 mmol/L 3.1(L) 3.3(L) 2.4(LL)  Chloride 98 - 111 mmol/L 106 104 107  CO2 22 - 32 mmol/L 22 19(L) 18(L)  Calcium 8.9 - 10.3 mg/dL 7.8(L) 8.0(L) 8.3(L)      Diet Order:   Diet Order             Diet NPO time specified  Diet effective now                   EDUCATION NEEDS:   Not appropriate for education at this time  Skin:  Skin Assessment: Reviewed RN Assessment  Last BM:  12/19  Height:   Ht Readings from Last 1  Encounters:  07/20/21 6\' 4"  (1.93 m)    Weight:   Wt Readings from Last 1 Encounters:  07/20/21 130.8 kg    Ideal Body Weight:   92 kg  BMI:  Body mass index is 35.1 kg/m.  Estimated Nutritional Needs:   Kcal:  2200-2300  Protein:  129-138  Fluid:  2 liters daily   07/22/21 MS,RD,CSG,LDN Contact: Royann Shivers

## 2021-07-21 DIAGNOSIS — N320 Bladder-neck obstruction: Secondary | ICD-10-CM | POA: Diagnosis not present

## 2021-07-21 DIAGNOSIS — A419 Sepsis, unspecified organism: Secondary | ICD-10-CM | POA: Diagnosis not present

## 2021-07-21 DIAGNOSIS — E039 Hypothyroidism, unspecified: Secondary | ICD-10-CM | POA: Diagnosis not present

## 2021-07-21 DIAGNOSIS — N3001 Acute cystitis with hematuria: Secondary | ICD-10-CM

## 2021-07-21 DIAGNOSIS — J9601 Acute respiratory failure with hypoxia: Secondary | ICD-10-CM

## 2021-07-21 LAB — CBC
HCT: 29.6 % — ABNORMAL LOW (ref 39.0–52.0)
Hemoglobin: 9.4 g/dL — ABNORMAL LOW (ref 13.0–17.0)
MCH: 34.6 pg — ABNORMAL HIGH (ref 26.0–34.0)
MCHC: 31.8 g/dL (ref 30.0–36.0)
MCV: 108.8 fL — ABNORMAL HIGH (ref 80.0–100.0)
Platelets: 68 10*3/uL — ABNORMAL LOW (ref 150–400)
RBC: 2.72 MIL/uL — ABNORMAL LOW (ref 4.22–5.81)
RDW: 12.1 % (ref 11.5–15.5)
WBC: 11.2 10*3/uL — ABNORMAL HIGH (ref 4.0–10.5)
nRBC: 0 % (ref 0.0–0.2)

## 2021-07-21 LAB — COMPREHENSIVE METABOLIC PANEL
ALT: 42 U/L (ref 0–44)
AST: 58 U/L — ABNORMAL HIGH (ref 15–41)
Albumin: 2.4 g/dL — ABNORMAL LOW (ref 3.5–5.0)
Alkaline Phosphatase: 69 U/L (ref 38–126)
Anion gap: 6 (ref 5–15)
BUN: 29 mg/dL — ABNORMAL HIGH (ref 8–23)
CO2: 25 mmol/L (ref 22–32)
Calcium: 8 mg/dL — ABNORMAL LOW (ref 8.9–10.3)
Chloride: 107 mmol/L (ref 98–111)
Creatinine, Ser: 0.87 mg/dL (ref 0.61–1.24)
GFR, Estimated: >60 mL/min (ref >60)
Glucose, Bld: 85 mg/dL (ref 70–99)
Potassium: 3.6 mmol/L (ref 3.5–5.1)
Sodium: 138 mmol/L (ref 135–145)
Total Bilirubin: 1.4 mg/dL — ABNORMAL HIGH (ref 0.3–1.2)
Total Protein: 5.3 g/dL — ABNORMAL LOW (ref 6.5–8.1)

## 2021-07-21 LAB — LACTIC ACID, PLASMA: Lactic Acid, Venous: 0.9 mmol/L (ref 0.5–1.9)

## 2021-07-21 LAB — GLUCOSE, CAPILLARY
Glucose-Capillary: 82 mg/dL (ref 70–99)
Glucose-Capillary: 86 mg/dL (ref 70–99)
Glucose-Capillary: 84 mg/dL (ref 70–99)
Glucose-Capillary: 84 mg/dL (ref 70–99)

## 2021-07-21 LAB — MAGNESIUM: Magnesium: 1.8 mg/dL (ref 1.7–2.4)

## 2021-07-21 MED ORDER — FUROSEMIDE 10 MG/ML IJ SOLN
20.0000 mg | Freq: Once | INTRAMUSCULAR | Status: AC
  Administered 2021-07-21: 18:00:00 20 mg via INTRAVENOUS
  Filled 2021-07-21: qty 2

## 2021-07-21 NOTE — Progress Notes (Signed)
[facility]   Patient name: Patrick Kerr Admit date: 07/17/2021  DOB: 1945-08-04 LOS: 3  MRN: [id number] Consult date: 07/19/2021  Referring provider: Dr. Manuella Ghazi, [facility]: Pelvic pain    History:  75 yo male former smoker brought to [facility] ER with weakness and pelvic pain.  He had circumcision in October 2022 for phimosis, and developed dysuria after this.  He had outpt treatment with antibiotics w/o improvement.  He had foley catheter from 06/04/21 to 07/05/21.  His outpt lab work showed hypokalemia and anemia.  He was started on antibiotics for UTI and pressors for septic shock with SBP in 70's.  Found to have hypoxia with SpO2 81% on room air in ER.  He had vomiting episode and concern for aspiration, and required intubation on 07/18/21.  PCCM asked to assist with management in ICU.  Hx from chart and medical team.    Past medical history:  HTN, BPH s/p TURP, Hypothyroidism, Depression  Significant events:  12/17 Admit 12/18 intubated 12/20 extubated 12/21 off pressors  Studies:  CT abd/pelvis 12/17 >> stones in distal Rt ureter, chronic bladder outlet obstruction  Micro:  Blood 12/17 >> Urine 12/17 >> Klebsiella   Lines:  Rt IJ CVL 12/18 >> ETT 12/18 >> 12/20   Antibiotics:  Rocephin 12/17 >>   Consults:      Interim history:  Daughter reports he has more fidgety today and having back pain issues while in bed  Vital signs:  BP (!) 141/73    Pulse (!) 107    Temp (!) 97.1 F (36.2 C)    Resp 20    Ht 6\' 4"  (1.93 m)    Wt 129.7 kg    SpO2 93%    BMI 34.81 kg/m   Intake/output:  I/O last 3 completed shifts: In: 2875.2 [I.V.:2321.7; NG/GT:453.3; IV Piggyback:100.2] Out: 2575 [Urine:2575]   Physical exam:   General - alert Eyes - pupils reactive ENT - no sinus tenderness, no stridor Cardiac - regular rate/rhythm, no murmur Chest - equal breath sounds b/l, no wheezing or rales Abdomen - soft, non  tender, + bowel sounds Extremities - no cyanosis, clubbing, or edema Skin - no rashes Neuro - intermittently agitated, follows commands  Best practice:   DVT - SCDs SUP - protonix Nutrition - NPO   Assessment/plan:   Klebsiella UTI, POA. - day 5 of rocephin - follow up blood culture from [id number]  Acute metabolic encephalopathy from sepsis. Hx of depression. - delirium preventive measures - prn ativan; uses as outpt - resume abilify, zoloft when able to swallow pills  Anemia of critical illness. Thrombocytopenia. - likely related to sepsis - f/u CBC  Hypokalemia. - f/u BMET  Hypothyroidism. - continue synthroid  Prolonged Qtc. - monitor on tele  Deconditioning. - PT/OT  Dysphagia after extubation. - f/u with speech therapy  PCCM will sign off.  Please call if additional assistance needed while he is in ICU.  Resolved hospital problems:  Lactic acidosis, Septic shock, Acute hypoxic respiratory failure, Aspiration pneumonitis ruled out  Goals of care/Family discussions:  Code status: full  Updated pt's daughter at bedside  Labs:   CMP Latest Ref Rng & Units 07/21/2021 07/20/2021 07/19/2021  Glucose 70 - 99 mg/dL 85 136(H) 126(H)  BUN 8 - 23 mg/dL 29(H) 29(H) 23  Creatinine 0.61 - 1.24 mg/dL 0.87 1.00 1.11  Sodium 135 - 145 mmol/L 138 135 134(L)  Potassium 3.5 - 5.1 mmol/L 3.6 3.1(L) 3.3(L)  Chloride 98 - 111 mmol/L 107 106 104  CO2 22 - 32 mmol/L 25 22 19(L)  Calcium 8.9 - 10.3 mg/dL 8.0(L) 7.8(L) 8.0(L)  Total Protein 6.5 - 8.1 g/dL 5.3(L) 5.1(L) 5.6(L)  Total Bilirubin 0.3 - 1.2 mg/dL 2.5(E) 5.2(D) 7.8(E)  Alkaline Phos 38 - 126 U/L 69 54 55  AST 15 - 41 U/L 58(H) 57(H) 90(H)  ALT 0 - 44 U/L 42 39 48(H)    CBC Latest Ref Rng & Units 07/21/2021 07/20/2021 07/19/2021  WBC 4.0 - 10.5 K/uL 11.2(H) 13.9(H) 19.3(H)  Hemoglobin 13.0 - 17.0 g/dL 4.2(P) 10.2(L) 11.6(L)  Hematocrit 39.0 - 52.0 % 29.6(L) 31.6(L) 35.4(L)  Platelets 150 - 400 K/uL 68(L) 82(L)  95(L)    ABG    Component Value Date/Time   PHART 7.414 07/19/2021 0530   PCO2ART 30.3 (L) 07/19/2021 0530   PO2ART 95.3 07/19/2021 0530   HCO3 20.9 07/19/2021 0530   ACIDBASEDEF 4.8 (H) 07/19/2021 0530   O2SAT 96.2 07/19/2021 0530    CBG (last 3)  Recent Labs    07/21/21 0424 07/21/21 0730 07/21/21 1138  GLUCAP 82 86 84    Signature:  Coralyn Helling, MD [facility] Pager - [phone] 07/21/2021, 2:30 PM

## 2021-07-21 NOTE — Progress Notes (Signed)
PROGRESS NOTE    Patrick ALWIN  Kerr:[id number] DOB: [birth year] DOA: 07/17/2021 PCP: [facility]   Brief Narrative:   Patrick Kerr is a 75 y.o. male with medical history significant for hypertension, BPH with history of TURP, hypothyroidism who presents to the emergency department accompanied by daughter due to weakness.  Patient was unable to provide history, history was obtained from ED physician and daughter at bedside.  Per report, patient recently had circumcision in October 2022 due to recent development of phimosis, patient did complain of burning penile sensation prior to and after circumcision.  He was prescribed a 7-day course of doxycycline followed with Macrobid for possible prostate infection through the [place] system and had urine culture demonstrating Klebsiella that was sensitive to Rocephin.  He continued to have ongoing weakness despite use of antibiotics.  He was admitted with septic shock present on admission secondary to Klebsiella UTI.  His sepsis physiology is now improving and he has been extubated on 12/20.  Assessment & Plan:   Principal Problem:   Sepsis secondary to UTI [facility]) Active Problems:   Macrocytic anemia   Hypokalemia   Thrombocytopenia (HCC)   Bladder outlet obstruction   Low TSH level   Lactic acidosis   Obesity (BMI 30.0-34.9)   Essential hypertension   BPH (benign prostatic hyperplasia)   Acquired hypothyroidism   Septic shock secondary to UTI-Klebsiella, present on admission -Briefly did require vasopressors, norepinephrine has been weaned off -Continue IV Rocephin at 2 g dose daily with resolution of fever day 5/7 -Blood cultures with no growth to date -Lactic acid has normalized -Since blood pressure is stable, will hold off on further IV fluids since he is developing anasarca   Urinary retention related to above-acute -Status post coud catheter placement -Discussed with Dr. Ronne Binning, urology who recommended to continue current  catheter until he is able to follow-up with urology as an outpatient   Intractable nausea and vomiting with hypotension related to above factors -Patient intubated for airway protection -Patient extubated on 12/20 -Appreciate PCCM assistance   Hypokalemia -Replete and continue monitoring   Prolonged QTC -Avoid QT prolonging medications and supplement electrolytes   Thrombocytopenia -down trending -Suspect related to infectious process -Continue monitoring   Essential hypertension -Using as needed hydralazine for elevated pressures, resume home meds once mental status improves   Hypothyroidism -Started on IV Synthroid, transition back to p.o. once mental status improved   BPH -Resume Flomax once he is able to safely take p.o. medications  Dysphagia -Likely greater impact by mental status-SLP following  Generalized weakness -PT/OT -Daughter is wanting to take him home with home health aides     DVT prophylaxis: SCDs Code Status: Full Family Communication: Spoke with daughter at bedside 12/21 Disposition Plan:  Status is: Inpatient   Remains inpatient appropriate because: Critical illness with need for IV antibiotics and intubation.   Consultants:  PCCM   Procedures:  12/18 intubation 12/20 extubation  Antimicrobials:  Anti-infectives (From admission, onward)    Start     Dose/Rate Route Frequency Ordered Stop   07/19/21 0700  cefTRIAXone (ROCEPHIN) 2 g in sodium chloride 0.9 % 100 mL IVPB        2 g 200 mL/hr over 30 Minutes Intravenous Every 24 hours 07/18/21 1157     07/18/21 0800  cefTRIAXone (ROCEPHIN) 2 g in sodium chloride 0.9 % 100 mL IVPB  Status:  Discontinued        2 g 200 mL/hr over 30 Minutes Intravenous  Every 24 hours 07/18/21 0711 07/18/21 1106   07/17/21 1645  cefTRIAXone (ROCEPHIN) 1 g in sodium chloride 0.9 % 100 mL IVPB        1 g 200 mL/hr over 30 Minutes Intravenous  Once 07/17/21 1632 07/17/21 1835   07/17/21 0000  cephALEXin (KEFLEX)  500 MG capsule        500 mg Oral 4 times daily 07/17/21 1736         Subjective: Patient is still somnolent/lethargic.  His daughter reports that overall he is better today as compared to yesterday.  He is less agitated today.  Objective: Vitals:   07/21/21 1400 07/21/21 1500 07/21/21 1600 07/21/21 1700  BP: (!) 151/68 (!) 122/47 (!) 145/62 (!) 163/74  Pulse: (!) 105 91 91 100  Resp: (!) 21 (!) 21 20 (!) 24  Temp:   98 F (36.7 C)   TempSrc:      SpO2: 95% 98% 97% 93%  Weight:      Height:        Intake/Output Summary (Last 24 hours) at 07/21/2021 1802 Last data filed at 07/21/2021 0853 Gross per 24 hour  Intake 1106.72 ml  Output 475 ml  Net 631.72 ml   Filed Weights   07/19/21 0900 07/20/21 0332 07/21/21 0429  Weight: 132.5 kg 130.8 kg 129.7 kg    Examination:  General exam: Somnolent, he does wake up to voice, does not really answer questions Respiratory system: Clear to auscultation. Respiratory effort normal. Cardiovascular system:RRR. No murmurs, rubs, gallops. Gastrointestinal system: Abdomen is nondistended, soft and nontender. No organomegaly or masses felt. Normal bowel sounds heard. Central nervous system: No focal neurological deficits. Extremities: Anasarca is present Skin: No rashes, lesions or ulcers Psychiatry: Somnolent, unable to assess     Data Reviewed: I have personally reviewed following labs and imaging studies  CBC: Recent Labs  Lab 07/17/21 1032 07/18/21 0550 07/19/21 0431 07/20/21 0430 07/21/21 0401  WBC 5.3 12.5* 19.3* 13.9* 11.2*  NEUTROABS 3.4  --   --   --   --   HGB 11.5* 11.5* 11.6* 10.2* 9.4*  HCT 34.7* 34.5* 35.4* 31.6* 29.6*  MCV 104.8* 106.5* 104.7* 106.8* 108.8*  PLT 140* 124* 95* 82* 68*   Basic Metabolic Panel: Recent Labs  Lab 07/17/21 1032 07/17/21 1834 07/18/21 0550 07/19/21 0431 07/20/21 0430 07/21/21 0401  NA 138  --  137 134* 135 138  K 3.0*  --  2.4* 3.3* 3.1* 3.6  CL 103  --  107 104 106 107   CO2 26  --  18* 19* 22 25  GLUCOSE 112*  --  196* 126* 136* 85  BUN 17  --  17 23 29* 29*  CREATININE 0.95  --  1.35* 1.11 1.00 0.87  CALCIUM 8.9  --  8.3* 8.0* 7.8* 8.0*  MG  --  1.2* 1.5* 1.7 2.1 1.8  PHOS  --   --  1.6*  --  2.9  --    GFR: Estimated Creatinine Clearance: 106.3 mL/min (by C-G formula based on SCr of 0.87 mg/dL). Liver Function Tests: Recent Labs  Lab 07/17/21 1032 07/18/21 0550 07/19/21 0431 07/20/21 0430 07/21/21 0401  AST 19 97* 90* 57* 58*  ALT 17 45* 48* 39 42  ALKPHOS 49 63 55 54 69  BILITOT 0.9 1.7* 1.6* 1.6* 1.4*  PROT 6.9 5.9* 5.6* 5.1* 5.3*  ALBUMIN 3.6 3.0* 2.5* 2.3* 2.4*   No results for input(s): LIPASE, AMYLASE in the last 168 hours. No  results for input(s): AMMONIA in the last 168 hours. Coagulation Profile: No results for input(s): INR, PROTIME in the last 168 hours. Cardiac Enzymes: No results for input(s): CKTOTAL, CKMB, CKMBINDEX, TROPONINI in the last 168 hours. BNP (last 3 results) No results for input(s): PROBNP in the last 8760 hours. HbA1C: No results for input(s): HGBA1C in the last 72 hours. CBG: Recent Labs  Lab 07/20/21 2316 07/21/21 0424 07/21/21 0730 07/21/21 1138 07/21/21 1536  GLUCAP 84 82 86 84 84   Lipid Profile: Recent Labs    07/20/21 0430  TRIG 562*   Thyroid Function Tests: No results for input(s): TSH, T4TOTAL, FREET4, T3FREE, THYROIDAB in the last 72 hours. Anemia Panel: No results for input(s): VITAMINB12, FOLATE, FERRITIN, TIBC, IRON, RETICCTPCT in the last 72 hours.  Sepsis Labs: Recent Labs  Lab 07/18/21 0745 07/18/21 1047 07/18/21 1621 07/19/21 0431 07/20/21 0430 07/21/21 0401  PROCALCITON 55.58  --   --  39.86 25.00  --   LATICACIDVEN 2.7*   < > 3.4* 3.3* 2.3* 0.9   < > = values in this interval not displayed.    Recent Results (from the past 240 hour(s))  Urine Culture     Status: Abnormal   Collection Time: 07/17/21  3:46 PM   Specimen: Urine, Clean Catch  Result Value Ref  Range Status   Specimen Description   Final    URINE, CLEAN CATCH Performed at [facility], [address] [place], [place] [address]    Special Requests   Final    NONE Performed at [facility], [address] [place], [place] [address]    Culture >=100,000 COLONIES/mL KLEBSIELLA PNEUMONIAE (A)  Final   Report Status 07/20/2021 FINAL  Final   Organism ID, Bacteria KLEBSIELLA PNEUMONIAE (A)  Final      Susceptibility   Klebsiella pneumoniae - MIC*    AMPICILLIN >=32 RESISTANT Resistant     CEFAZOLIN <=4 SENSITIVE Sensitive     CEFEPIME <=0.12 SENSITIVE Sensitive     CEFTRIAXONE <=0.25 SENSITIVE Sensitive     CIPROFLOXACIN <=0.25 SENSITIVE Sensitive     GENTAMICIN <=1 SENSITIVE Sensitive     IMIPENEM <=0.25 SENSITIVE Sensitive     NITROFURANTOIN 64 INTERMEDIATE Intermediate     TRIMETH/SULFA <=20 SENSITIVE Sensitive     AMPICILLIN/SULBACTAM 16 INTERMEDIATE Intermediate     PIP/TAZO 16 SENSITIVE Sensitive     * >=100,000 COLONIES/mL KLEBSIELLA PNEUMONIAE  Resp Panel by RT-PCR (Flu A&B, Covid) Nasopharyngeal Swab     Status: None   Collection Time: 07/17/21  4:37 PM   Specimen: Nasopharyngeal Swab; Nasopharyngeal(NP) swabs in vial transport medium  Result Value Ref Range Status   SARS Coronavirus 2 by RT PCR NEGATIVE NEGATIVE Final    Comment: (NOTE) SARS-CoV-2 target nucleic acids are NOT DETECTED.  The SARS-CoV-2 RNA is generally detectable in upper respiratory specimens during the acute phase of infection. The lowest concentration of SARS-CoV-2 viral copies this assay can detect is 138 copies/mL. A negative result does not preclude SARS-Cov-2 infection and should not be used as the sole basis for treatment or other patient management decisions. A negative result may occur with  improper specimen collection/handling, submission of specimen other than nasopharyngeal swab, presence of viral mutation(s) within the areas targeted by this assay, and inadequate number of  viral copies(<138 copies/mL). A negative result must be combined with clinical observations, patient history, and epidemiological information. The expected result is Negative.  Fact Sheet for Patients:  BloggerCourse.com  Fact Sheet for Healthcare Providers:  SeriousBroker.it  This test is no t yet approved or cleared by the [place] and  has been authorized for detection and/or diagnosis of SARS-CoV-2 by FDA under an Emergency Use Authorization (EUA). This EUA will remain  in effect (meaning this test can be used) for the duration of the COVID-19 declaration under Section 564(b)(1) of the Act, 21 U.S.C.section 360bbb-3(b)(1), unless the authorization is terminated  or revoked sooner.       Influenza A by PCR NEGATIVE NEGATIVE Final   Influenza B by PCR NEGATIVE NEGATIVE Final    Comment: (NOTE) The Xpert Xpress SARS-CoV-2/FLU/RSV plus assay is intended as an aid in the diagnosis of influenza from Nasopharyngeal swab specimens and should not be used as a sole basis for treatment. Nasal washings and aspirates are unacceptable for Xpert Xpress SARS-CoV-2/FLU/RSV testing.  Fact Sheet for Patients: BloggerCourse.com  Fact Sheet for Healthcare Providers: SeriousBroker.it  This test is not yet approved or cleared by the [place] FDA and has been authorized for detection and/or diagnosis of SARS-CoV-2 by FDA under an Emergency Use Authorization (EUA). This EUA will remain in effect (meaning this test can be used) for the duration of the COVID-19 declaration under Section 564(b)(1) of the Act, 21 U.S.C. section 360bbb-3(b)(1), unless the authorization is terminated or revoked.  Performed at [facility], [address], [place], [place] [address]   Blood culture (routine x 2)     Status: None (Preliminary result)   Collection Time: 07/17/21  6:34 PM   Specimen: Right  Antecubital; Blood  Result Value Ref Range Status   Specimen Description   Final    RIGHT ANTECUBITAL BOTTLES DRAWN AEROBIC AND ANAEROBIC   Special Requests Blood Culture adequate volume  Final   Culture   Final    NO GROWTH 4 DAYS Performed at [facility], [address], [place], [place] [address]    Report Status PENDING  Incomplete  Blood culture (routine x 2)     Status: None (Preliminary result)   Collection Time: 07/17/21  6:41 PM   Specimen: Left Antecubital; Blood  Result Value Ref Range Status   Specimen Description   Final    LEFT ANTECUBITAL BOTTLES DRAWN AEROBIC AND ANAEROBIC   Special Requests   Final    Blood Culture results may not be optimal due to an excessive volume of blood received in culture bottles   Culture   Final    NO GROWTH 4 DAYS Performed at [facility], [address], [place], [place] [address]    Report Status PENDING  Incomplete  MRSA Next Gen by PCR, Nasal     Status: None   Collection Time: 07/18/21  5:32 AM   Specimen: Nasal Mucosa; Nasal Swab  Result Value Ref Range Status   MRSA by PCR Next Gen NOT DETECTED NOT DETECTED Final    Comment: (NOTE) The GeneXpert MRSA Assay (FDA approved for NASAL specimens only), is one component of a comprehensive MRSA colonization surveillance program. It is not intended to diagnose MRSA infection nor to guide or monitor treatment for MRSA infections. Test performance is not FDA approved in patients less than 15 years old. Performed at [facility], [address], [place], [place] [address]          Radiology Studies: [facility] Chest [facility] 1 View  Result Date: 07/20/2021 CLINICAL DATA:  Evaluate lungs EXAM: PORTABLE CHEST 1 VIEW COMPARISON:  Chest x-ray dated July 19, 2021 FINDINGS: Right IJ line, ET tube and enteric tube  are unchanged in position. Increased bilateral linear opacities, likely due to worsening atelectasis. Probable small bilateral pleural effusions. No evidence of pneumothorax. IMPRESSION:  Increased bilateral linear opacities, likely due to worsening atelectasis. Electronically Signed   By: Allegra Lai M.D.   On: 07/20/2021 08:11        Scheduled Meds:  Chlorhexidine Gluconate Cloth  6 each Topical Daily   furosemide  20 mg Intravenous Once   levothyroxine  137 mcg Oral Q0600   mouth rinse  15 mL Mouth Rinse BID   potassium & sodium phosphates  1 packet Per NG tube TID   Continuous Infusions:  cefTRIAXone (ROCEPHIN)  IV Stopped (07/21/21 0654)     LOS: 3 days    Time spent: 35 minutes    Erick Blinks, MD [facility]  If 7PM-7AM, please contact night-coverage [email or web address] 07/21/2021, 6:02 PM

## 2021-07-21 NOTE — Progress Notes (Signed)
R IJ removed per verbal order after peripheral IV access was obtained.

## 2021-07-21 NOTE — Evaluation (Signed)
Clinical/Bedside Swallow Evaluation Patient Details  Name: Patrick Kerr MRN: [id number] Date of Birth: 1946-03-07  Today's Date: 07/21/2021 Time: SLP Start Time (ACUTE ONLY): 1140 SLP Stop Time (ACUTE ONLY): 1203 SLP Time Calculation (min) (ACUTE ONLY): 23 min  Past Medical History:  Past Medical History:  Diagnosis Date   Arthritis    Hypertension    Past Surgical History:  Past Surgical History:  Procedure Laterality Date   CIRCUMCISION  05/25/2021   TRANSURETHRAL RESECTION OF PROSTATE     HPI:  Patrick Kerr is a 75 y.o. male with medical history significant for hypertension, BPH with history of TURP, hypothyroidism who presents to the emergency department accompanied by daughter due to weakness.  Patient was unable to provide history, history was obtained from ED physician and daughter at bedside.  Per report, patient recently had circumcision in October 2022 due to recent development of phimosis, patient did complain of burning penile sensation prior to and after circumcision.  Daughter states that patient also complained of worsening burning sensation in the rectum when he has a bowel movement.  Outside screening colonoscopy done in October at bedside showed small polyp and some internal hemorrhoids, but patient denies any rectal bleeding.  He was prescribed with 7-day course of doxycycline which was followed with Macrobid for possible prostate infection, but there was no improvement in symptoms. Foley catheter was placed on 11/4 and this was removed on 12/5. Per daughter, PCP was seen about 4 days ago and blood work done was reported to be positive for hypokalemia and anemia.  Patient continued to show some weakness, so daughter decided to take patient to the ED for further evaluation and management.  There was no report of fever, chest pain, shortness of breath, headache    Assessment / Plan / Recommendation  Clinical Impression  Clinical swallow evaluation completed, however  Pt unable to sustain sufficient alertness and attention to task for safe po trials. Pt mumbles at times and did open mouth for ice chip, but made not attempt to manipulate it despite max multimodality cues. SLP reviewed aspiration precautions with Pt's daughter and she understands the reason for nothing by mouth until he is sufficiently alert. Recommend oral care and ok for single ice chips or small sips of water if Pt is alert and requesting when provided by RN. SLP will follow. SLP Visit Diagnosis: Dysphagia, unspecified (R13.10)    Aspiration Risk  Moderate aspiration risk;Risk for inadequate nutrition/hydration    Diet Recommendation Ice chips PRN after oral care;NPO   Medication Administration: Via alternative means    Other  Recommendations Oral Care Recommendations: Oral care prior to ice chip/H20;Staff/trained caregiver to provide oral care;Oral care QID    Recommendations for follow up therapy are one component of a multi-disciplinary discharge planning process, led by the attending physician.  Recommendations may be updated based on patient status, additional functional criteria and insurance authorization.  Follow up Recommendations Skilled nursing-short term rehab (<3 hours/day)      Assistance Recommended at Discharge Frequent or constant Supervision/Assistance  Functional Status Assessment Patient has had a recent decline in their functional status and demonstrates the ability to make significant improvements in function in a reasonable and predictable amount of time.  Frequency and Duration min 2x/week  1 week       Prognosis Prognosis for Safe Diet Advancement: Fair Barriers to Reach Goals: Medication      Swallow Study   General Date of Onset: 07/18/21 HPI: Patrick Kerr is  a 75 y.o. male with medical history significant for hypertension, BPH with history of TURP, hypothyroidism who presents to the emergency department accompanied by daughter due to weakness.   Patient was unable to provide history, history was obtained from ED physician and daughter at bedside.  Per report, patient recently had circumcision in October 2022 due to recent development of phimosis, patient did complain of burning penile sensation prior to and after circumcision.  Daughter states that patient also complained of worsening burning sensation in the rectum when he has a bowel movement.  Outside screening colonoscopy done in October at bedside showed small polyp and some internal hemorrhoids, but patient denies any rectal bleeding.  He was prescribed with 7-day course of doxycycline which was followed with Macrobid for possible prostate infection, but there was no improvement in symptoms. Foley catheter was placed on 11/4 and this was removed on 12/5. Per daughter, PCP was seen about 4 days ago and blood work done was reported to be positive for hypokalemia and anemia.  Patient continued to show some weakness, so daughter decided to take patient to the ED for further evaluation and management.  There was no report of fever, chest pain, shortness of breath, headache Type of Study: Bedside Swallow Evaluation Previous Swallow Assessment: N/A Diet Prior to this Study: NPO Temperature Spikes Noted: No Respiratory Status: Nasal cannula History of Recent Intubation: Yes Length of Intubations (days): 2 days Date extubated: 07/20/21 Behavior/Cognition: Lethargic/Drowsy;Requires cueing;Doesn't follow directions Oral Cavity Assessment: Within Functional Limits Oral Care Completed by SLP: Yes Oral Cavity - Dentition: Adequate natural dentition Vision: Impaired for self-feeding Self-Feeding Abilities: Total assist Patient Positioning: Upright in bed Baseline Vocal Quality: Not observed Volitional Cough: Cognitively unable to elicit Volitional Swallow: Unable to elicit    Oral/Motor/Sensory Function Overall Oral Motor/Sensory Function: Generalized oral weakness   Ice Chips Ice chips:  Impaired   Thin Liquid Thin Liquid: Impaired    Nectar Thick Nectar Thick Liquid: Not tested   Honey Thick Honey Thick Liquid: Not tested   Puree Puree: Not tested   Solid     Solid: Not tested     Thank you,  Havery Moros, CCC-SLP [phone]   Patrick Kerr 07/21/2021,12:58 PM

## 2021-07-21 NOTE — Evaluation (Signed)
Physical Therapy Evaluation Patient Details Name: Patrick Kerr MRN: [id number] DOB: Dec 12, 1945 Today's Date: 07/21/2021  History of Present Illness  Patrick Kerr is a 75 y.o. male with medical history significant for hypertension, BPH with history of TURP, hypothyroidism who presents to the emergency department accompanied by daughter due to weakness.  Patient was unable to provide history, history was obtained from ED physician and daughter at bedside.  Per report, patient recently had circumcision in October 2022 due to recent development of phimosis, patient did complain of burning penile sensation prior to and after circumcision.  Daughter states that patient also complained of worsening burning sensation in the rectum when he has a bowel movement.  Outside screening colonoscopy done in October at bedside showed small polyp and some internal hemorrhoids, but patient denies any rectal bleeding.  He was prescribed with 7-day course of doxycycline which was followed with Macrobid for possible prostate infection, but there was no improvement in symptoms. Foley catheter was placed on 11/4 and this was removed on 12/5. Per daughter, PCP was seen about 4 days ago and blood work done was reported to be positive for hypokalemia and anemia.  Patient continued to show some weakness, so daughter decided to take patient to the ED for further evaluation and management.  There was no report of fever, chest pain, shortness of breath, headache   Clinical Impression  Patient demonstrates slow labored movement for sitting up at bedside requiring Max assist due to generalized weakness, frequent leaning backwards once seated and unable to stand due to BLE weakness.  Patient required 2 person Max assist to reposition when put back to bed.  Patient will benefit from continued skilled physical therapy in hospital and recommended venue below to increase strength, balance, endurance for safe ADLs and gait.          Recommendations for follow up therapy are one component of a multi-disciplinary discharge planning process, led by the attending physician.  Recommendations may be updated based on patient status, additional functional criteria and insurance authorization.  Follow Up Recommendations Skilled nursing-short term rehab (<3 hours/day)    Assistance Recommended at Discharge Intermittent Supervision/Assistance  Functional Status Assessment Patient has had a recent decline in their functional status and demonstrates the ability to make significant improvements in function in a reasonable and predictable amount of time.  Equipment Recommendations  None recommended by PT    Recommendations for Other Services       Precautions / Restrictions Precautions Precautions: Fall Restrictions Weight Bearing Restrictions: No      Mobility  Bed Mobility Overal bed mobility: Needs Assistance Bed Mobility: Sit to Supine;Supine to Sit     Supine to sit: Max assist Sit to supine: Max assist   General bed mobility comments: as per OT notes    Transfers                   General transfer comment: Attemtped on trial of sit to stand without success today.    Ambulation/Gait                  Stairs            Wheelchair Mobility    Modified Rankin (Stroke Patients Only)       Balance Overall balance assessment: Needs assistance Sitting-balance support: Feet supported;No upper extremity supported Sitting balance-Leahy Scale: Good Sitting balance - Comments: seated at EOB Postural control: Posterior lean  Pertinent Vitals/Pain Pain Assessment: No/denies pain Faces Pain Scale: No hurt    Home Living Family/patient expects to be discharged to:: Private residence Living Arrangements: Spouse/significant other;Children Available Help at Discharge: Available PRN/intermittently;Personal care attendant;Family Type of  Home: House Home Access: Ramped entrance       Home Layout: Two level;Able to live on main level with bedroom/bathroom Home Equipment: Rolling Walker (2 wheels);Cane - single point;Wheelchair - manual;Shower seat - built in;Other (comment) Additional Comments: Daughter reports she is able to set up increased support to allow for 24/7 assist. Currently personal care workers are there monday through friday during the day and pt and wife ar alone at night. Daughter lives behind pt's home.    Prior Function Prior Level of Function : Needs assist       Physical Assist : Mobility (physical);ADLs (physical) Mobility (physical): Bed mobility;Transfers;Gait;Stairs ADLs (physical): Grooming;Bathing;Dressing;Toileting;IADLs Mobility Comments: Household ambulator with RW PRN. ADLs Comments: Pt is assisted by family for ADL's and IADL's.     Hand Dominance   Dominant Hand: Right    Extremity/Trunk Assessment   Upper Extremity Assessment Upper Extremity Assessment: Defer to OT evaluation RUE Deficits / Details: 2+/5 R shoulder flexion. 4/5 grip. WFL P/ROM shoulder flexion, abduction, elbow flexion, extension. Difficult to fully assess A/ROM due to pt lethargy level. RUE Coordination: decreased fine motor;decreased gross motor LUE Deficits / Details: 3-/5 shoulder flexion. WFL P/ROM shoulder flexion, abduction, elbow flexion and extension. 4/5 grip. LUE Coordination: decreased fine motor;decreased gross motor    Lower Extremity Assessment Lower Extremity Assessment: Generalized weakness    Cervical / Trunk Assessment Cervical / Trunk Assessment: Normal  Communication   Communication: Expressive difficulties;Other (comment)  Cognition Arousal/Alertness: Lethargic Behavior During Therapy: Flat affect Overall Cognitive Status: Impaired/Different from baseline Area of Impairment: Following commands                       Following Commands: Follows one step commands  consistently       General Comments: Patient lethargic requiring repeated verbal/tactile cueing to follow directions        General Comments      Exercises     Assessment/Plan    PT Assessment Patient needs continued PT services  PT Problem List Decreased strength;Decreased activity tolerance;Decreased balance;Decreased mobility       PT Treatment Interventions DME instruction;Gait training;Functional mobility training;Therapeutic activities;Therapeutic exercise;Stair training;Patient/family education;Balance training    PT Goals (Current goals can be found in the Care Plan section)  Acute Rehab PT Goals Patient Stated Goal: return home with family to assist PT Goal Formulation: With patient/family Time For Goal Achievement: 08/11/21 Potential to Achieve Goals: Good    Frequency Min 3X/week   Barriers to discharge        Co-evaluation PT/OT/SLP Co-Evaluation/Treatment: Yes Reason for Co-Treatment: Complexity of the patient's impairments (multi-system involvement);To address functional/ADL transfers PT goals addressed during session: Mobility/safety with mobility;Balance;Proper use of DME OT goals addressed during session: ADL's and self-care       AM-PAC PT "6 Clicks" Mobility  Outcome Measure Help needed turning from your back to your side while in a flat bed without using bedrails?: A Lot Help needed moving from lying on your back to sitting on the side of a flat bed without using bedrails?: A Lot Help needed moving to and from a bed to a chair (including a wheelchair)?: Total Help needed standing up from a chair using your arms (e.g., wheelchair or bedside chair)?: Total Help needed  to walk in hospital room?: Total Help needed climbing 3-5 steps with a railing? : Total 6 Click Score: 8    End of Session   Activity Tolerance: Patient tolerated treatment well;Patient limited by fatigue;Patient limited by lethargy Patient left: in bed;with call bell/phone  within reach;with family/visitor present Nurse Communication: Mobility status PT Visit Diagnosis: Unsteadiness on feet (R26.81);Other abnormalities of gait and mobility (R26.89);Muscle weakness (generalized) (M62.81)    Time: 7262-0355 PT Time Calculation (min) (ACUTE ONLY): 31 min   Charges:   PT Evaluation $PT Eval Moderate Complexity: 1 Mod PT Treatments $Therapeutic Activity: 23-37 mins        12:22 PM, 07/21/21 Ocie Bob, MPT Physical Therapist with [facility] [phone] office [phone] mobile phone

## 2021-07-21 NOTE — Plan of Care (Signed)
°  Problem: Acute Rehab PT Goals(only PT should resolve) Goal: Pt Will Go Supine/Side To Sit Outcome: Progressing Flowsheets (Taken 07/21/2021 1223) Pt will go Supine/Side to Sit: with moderate assist Goal: Patient Will Transfer Sit To/From Stand Outcome: Progressing Flowsheets (Taken 07/21/2021 1223) Patient will transfer sit to/from stand: with moderate assist Goal: Pt Will Transfer Bed To Chair/Chair To Bed Outcome: Progressing Flowsheets (Taken 07/21/2021 1223) Pt will Transfer Bed to Chair/Chair to Bed: with mod assist Goal: Pt Will Ambulate Outcome: Progressing Flowsheets (Taken 07/21/2021 1223) Pt will Ambulate:  15 feet  with moderate assist  with rolling walker   12:23 PM, 07/21/21 Ocie Bob, MPT Physical Therapist with [facility] 336 [phone] office [phone] mobile phone

## 2021-07-21 NOTE — Plan of Care (Signed)
°  Problem: Acute Rehab OT Goals (only OT should resolve) Goal: Pt. Will Perform Eating Flowsheets (Taken 07/21/2021 1009) Pt Will Perform Eating:  with supervision  bed level  sitting Goal: Pt. Will Perform Grooming Flowsheets (Taken 07/21/2021 1009) Pt Will Perform Grooming:  sitting  with min assist Goal: Pt. Will Perform Upper Body Bathing Flowsheets (Taken 07/21/2021 1009) Pt Will Perform Upper Body Bathing:  sitting  with min assist  with adaptive equipment Goal: Pt. Will Perform Lower Body Bathing Flowsheets (Taken 07/21/2021 1009) Pt Will Perform Lower Body Bathing:  sitting/lateral leans  with mod assist  with adaptive equipment Goal: Pt. Will Perform Upper Body Dressing Flowsheets (Taken 07/21/2021 1009) Pt Will Perform Upper Body Dressing:  with min assist  sitting Goal: Pt. Will Perform Lower Body Dressing Flowsheets (Taken 07/21/2021 1009) Pt Will Perform Lower Body Dressing:  with mod assist  with adaptive equipment  sitting/lateral leans Goal: Pt. Will Transfer To Toilet Flowsheets (Taken 07/21/2021 1009) Pt Will Transfer to Toilet:  with mod assist  stand pivot transfer  bedside commode Goal: Pt/Caregiver Will Perform Home Exercise Program Flowsheets (Taken 07/21/2021 1009) Pt/caregiver will Perform Home Exercise Program:  Increased ROM  Increased strength  Both right and left upper extremity  With minimal assist  Hence Derrick OT, MOT

## 2021-07-21 NOTE — Evaluation (Signed)
Occupational Therapy Evaluation Patient Details Name: Patrick Kerr MRN: [id number] DOB: Jul 15, 1946 Today's Date: 07/21/2021   History of Present Illness Patrick Kerr is a 75 y.o. male with medical history significant for hypertension, BPH with history of TURP, hypothyroidism who presents to the emergency department accompanied by daughter due to weakness.  Patient was unable to provide history, history was obtained from ED physician and daughter at bedside.  Per report, patient recently had circumcision in October 2022 due to recent development of phimosis, patient did complain of burning penile sensation prior to and after circumcision.  Daughter states that patient also complained of worsening burning sensation in the rectum when he has a bowel movement.  Outside screening colonoscopy done in October at bedside showed small polyp and some internal hemorrhoids, but patient denies any rectal bleeding.  He was prescribed with 7-day course of doxycycline which was followed with Macrobid for possible prostate infection, but there was no improvement in symptoms. Foley catheter was placed on 11/4 and this was removed on 12/5. Per daughter, PCP was seen about 4 days ago and blood work done was reported to be positive for hypokalemia and anemia.  Patient continued to show some weakness, so daughter decided to take patient to the ED for further evaluation and management.  There was no report of fever, chest pain, shortness of breath, headache   Clinical Impression   Pt lethargic but able to participate in to OT/PT co-evaluation. Daughter present and reporting pt history. Pt grunting minimally today with no verbal expression. Pt demonstrates need for max A for bed mobility with poor progressing to fair balance at times seated at EOB. Sit to stand attempted without success using RW and assist +2. Pt lethargy level made it difficult to fully assess B UE ROM or vision. Pt demonstrates B UE weakness but WFL  P/ROM. Pt will benefit from continued OT in the hospital and recommended venue below to increase strength, balance, and endurance for safe ADL's.        Recommendations for follow up therapy are one component of a multi-disciplinary discharge planning process, led by the attending physician.  Recommendations may be updated based on patient status, additional functional criteria and insurance authorization.   Follow Up Recommendations  Skilled nursing-short term rehab (<3 hours/day)    Assistance Recommended at Discharge Frequent or constant Supervision/Assistance  Functional Status Assessment  Patient has had a recent decline in their functional status and demonstrates the ability to make significant improvements in function in a reasonable and predictable amount of time.  Equipment Recommendations  BSC/3in1    Recommendations for Other Services       Precautions / Restrictions Precautions Precautions: Fall Restrictions Weight Bearing Restrictions: No      Mobility Bed Mobility Overal bed mobility: Needs Assistance Bed Mobility: Sit to Supine;Supine to Sit     Supine to sit: Max assist Sit to supine: Max assist   General bed mobility comments: Pt very elthargic needing assist.    Transfers                   General transfer comment: Attemtped on trial of sit to stand without success today.      Balance Overall balance assessment: Needs assistance Sitting-balance support: Single extremity supported;Feet supported Sitting balance-Leahy Scale: Poor Sitting balance - Comments: poor to fair with posterior lean at times; balance improved with time Postural control: Posterior lean  ADL either performed or assessed with clinical judgement   ADL Overall ADL's : Needs assistance/impaired     Grooming: Moderate assistance;Sitting   Upper Body Bathing: Moderate assistance;Sitting   Lower Body Bathing: Total  assistance;Maximal assistance;Sitting/lateral leans   Upper Body Dressing : Minimal assistance;Moderate assistance;Sitting   Lower Body Dressing: Total assistance;Bed level Lower Body Dressing Details (indicate cue type and reason): Donning socks at bed level. Toilet Transfer: Total assistance;Stand-pivot Toilet Transfer Details (indicate cue type and reason): Attempted sit to stand with assist x2 without success. Toileting- Clothing Manipulation and Hygiene: Maximal assistance;Total assistance;Sitting/lateral lean         General ADL Comments: Levels taken via clinical judgement based on observation during session of pt's mobility and assist level for some ADL's.     Vision Baseline Vision/History: 1 Wears glasses Ability to See in Adequate Light: 1 Impaired Patient Visual Report: No change from baseline (according to daughter) Vision Assessment?: Vision impaired- to be further tested in functional context (Lethargic and unable to keep eyes open.)                Pertinent Vitals/Pain Pain Assessment: Faces Faces Pain Scale: No hurt     Hand Dominance Right   Extremity/Trunk Assessment Upper Extremity Assessment Upper Extremity Assessment: RUE deficits/detail;LUE deficits/detail RUE Deficits / Details: 2+/5 R shoulder flexion. 4/5 grip. WFL P/ROM shoulder flexion, abduction, elbow flexion, extension. Difficult to fully assess A/ROM due to pt lethargy level. RUE Coordination: decreased fine motor;decreased gross motor LUE Deficits / Details: 3-/5 shoulder flexion. WFL P/ROM shoulder flexion, abduction, elbow flexion and extension. 4/5 grip. LUE Coordination: decreased fine motor;decreased gross motor   Lower Extremity Assessment Lower Extremity Assessment: Defer to PT evaluation       Communication Communication Communication: Expressive difficulties;Other (comment) (Pt very lethargic today. Grunting only.)   Cognition Arousal/Alertness: Lethargic Behavior During  Therapy: Flat affect Overall Cognitive Status: Impaired/Different from baseline Area of Impairment: Following commands                       Following Commands: Follows one step commands inconsistently       General Comments: Pt very lethargic today with mixed ability to follow commands. Eyes closed or partially open most of session.                      Home Living Family/patient expects to be discharged to:: Private residence Living Arrangements: Spouse/significant other;Children Available Help at Discharge: Available PRN/intermittently;Personal care attendant;Family Type of Home: House Home Access: Ramped entrance     Home Layout: Two level;Able to live on main level with bedroom/bathroom     Bathroom Shower/Tub: Producer, television/film/video: Standard Bathroom Accessibility: Yes How Accessible: Accessible via walker Home Equipment: Rolling Walker (2 wheels);Cane - single point;Wheelchair - manual;Shower seat - built in;Other (comment) (Attached railing system to toilet.)   Additional Comments: Daughter reports she is able to set up increased support to allow for 24/7 assist. Currently personal care workers are there monday through friday during the day and pt and wife ar alone at night. Daughter lives behind pt's home.      Prior Functioning/Environment Prior Level of Function : Needs assist       Physical Assist : Mobility (physical);ADLs (physical)   ADLs (physical): Grooming;Bathing;Dressing;Toileting;IADLs Mobility Comments: Household ambulator with RW PRN. ADLs Comments: Pt is assisted by family for ADL's and IADL's.        OT Problem List:  Decreased strength;Decreased range of motion;Decreased activity tolerance;Impaired balance (sitting and/or standing);Impaired vision/perception;Decreased coordination;Decreased cognition;Obesity      OT Treatment/Interventions: Self-care/ADL training;Therapeutic exercise;Therapeutic  activities;Visual/perceptual remediation/compensation;Cognitive remediation/compensation;Patient/family education;Balance training;DME and/or AE instruction    OT Goals(Current goals can be found in the care plan section) Acute Rehab OT Goals Patient Stated Goal: none OT Goal Formulation: Patient unable to participate in goal setting Time For Goal Achievement: 08/04/21 Potential to Achieve Goals: Fair  OT Frequency: Min 2X/week               Co-evaluation PT/OT/SLP Co-Evaluation/Treatment: Yes Reason for Co-Treatment: Complexity of the patient's impairments (multi-system involvement)   OT goals addressed during session: ADL's and self-care      AM-PAC OT "6 Clicks" Daily Activity     Outcome Measure                 End of Session Equipment Utilized During Treatment: Rolling walker (2 wheels) Nurse Communication: Mobility status  Activity Tolerance: Patient limited by lethargy Patient left: in bed;with call bell/phone within reach;with family/visitor present  OT Visit Diagnosis: Unsteadiness on feet (R26.81);Other abnormalities of gait and mobility (R26.89);Muscle weakness (generalized) (M62.81)                Time: 6720-9470 OT Time Calculation (min): 31 min Charges:  OT General Charges $OT Visit: 1 Visit OT Evaluation $OT Eval Moderate Complexity: 1 Mod  Othell Diluzio OT, MOT  Danie Chandler 07/21/2021, 10:06 AM

## 2021-07-22 DIAGNOSIS — N3001 Acute cystitis with hematuria: Secondary | ICD-10-CM | POA: Diagnosis not present

## 2021-07-22 DIAGNOSIS — E039 Hypothyroidism, unspecified: Secondary | ICD-10-CM | POA: Diagnosis not present

## 2021-07-22 DIAGNOSIS — A419 Sepsis, unspecified organism: Secondary | ICD-10-CM | POA: Diagnosis not present

## 2021-07-22 DIAGNOSIS — N320 Bladder-neck obstruction: Secondary | ICD-10-CM | POA: Diagnosis not present

## 2021-07-22 LAB — CBC
HCT: 32.1 % — ABNORMAL LOW (ref 39.0–52.0)
Hemoglobin: 10.4 g/dL — ABNORMAL LOW (ref 13.0–17.0)
MCH: 35.1 pg — ABNORMAL HIGH (ref 26.0–34.0)
MCHC: 32.4 g/dL (ref 30.0–36.0)
MCV: 108.4 fL — ABNORMAL HIGH (ref 80.0–100.0)
Platelets: 76 10*3/uL — ABNORMAL LOW (ref 150–400)
RBC: 2.96 MIL/uL — ABNORMAL LOW (ref 4.22–5.81)
RDW: 12 % (ref 11.5–15.5)
WBC: 8.1 10*3/uL (ref 4.0–10.5)
nRBC: 0 % (ref 0.0–0.2)

## 2021-07-22 LAB — MAGNESIUM: Magnesium: 1.6 mg/dL — ABNORMAL LOW (ref 1.7–2.4)

## 2021-07-22 LAB — COMPREHENSIVE METABOLIC PANEL
ALT: 44 U/L (ref 0–44)
AST: 53 U/L — ABNORMAL HIGH (ref 15–41)
Albumin: 2.6 g/dL — ABNORMAL LOW (ref 3.5–5.0)
Alkaline Phosphatase: 80 U/L (ref 38–126)
Anion gap: 8 (ref 5–15)
BUN: 24 mg/dL — ABNORMAL HIGH (ref 8–23)
CO2: 25 mmol/L (ref 22–32)
Calcium: 8.3 mg/dL — ABNORMAL LOW (ref 8.9–10.3)
Chloride: 107 mmol/L (ref 98–111)
Creatinine, Ser: 0.85 mg/dL (ref 0.61–1.24)
GFR, Estimated: >60 mL/min (ref >60)
Glucose, Bld: 84 mg/dL (ref 70–99)
Potassium: 3.5 mmol/L (ref 3.5–5.1)
Sodium: 140 mmol/L (ref 135–145)
Total Bilirubin: 0.9 mg/dL (ref 0.3–1.2)
Total Protein: 5.7 g/dL — ABNORMAL LOW (ref 6.5–8.1)

## 2021-07-22 LAB — CULTURE, BLOOD (ROUTINE X 2)
Culture: NO GROWTH
Culture: NO GROWTH
Special Requests: ADEQUATE

## 2021-07-22 LAB — GLUCOSE, CAPILLARY: Glucose-Capillary: 142 mg/dL — ABNORMAL HIGH (ref 70–99)

## 2021-07-22 LAB — PHOSPHORUS: Phosphorus: 3 mg/dL (ref 2.5–4.6)

## 2021-07-22 MED ORDER — MAGNESIUM SULFATE 2 GM/50ML IV SOLN
2.0000 g | Freq: Once | INTRAVENOUS | Status: AC
  Administered 2021-07-22: 11:00:00 2 g via INTRAVENOUS
  Filled 2021-07-22: qty 50

## 2021-07-22 MED ORDER — POLYETHYLENE GLYCOL 3350 17 G PO PACK
17.0000 g | PACK | Freq: Every day | ORAL | Status: DC
  Administered 2021-07-23 – 2021-07-29 (×5): 17 g via ORAL
  Filled 2021-07-22 (×7): qty 1

## 2021-07-22 MED ORDER — ARIPIPRAZOLE 5 MG PO TABS
5.0000 mg | ORAL_TABLET | Freq: Every day | ORAL | Status: DC
  Administered 2021-07-22 – 2021-07-24 (×3): 5 mg via ORAL
  Filled 2021-07-22 (×3): qty 1

## 2021-07-22 MED ORDER — TAMSULOSIN HCL 0.4 MG PO CAPS
0.4000 mg | ORAL_CAPSULE | Freq: Every day | ORAL | Status: DC
  Administered 2021-07-23 – 2021-07-29 (×7): 0.4 mg via ORAL
  Filled 2021-07-22 (×7): qty 1

## 2021-07-22 MED ORDER — SERTRALINE HCL 50 MG PO TABS
200.0000 mg | ORAL_TABLET | Freq: Every day | ORAL | Status: DC
  Administered 2021-07-23 – 2021-07-25 (×3): 200 mg via ORAL
  Filled 2021-07-22 (×3): qty 4

## 2021-07-22 MED ORDER — CYANOCOBALAMIN 1000 MCG/ML IJ SOLN
1000.0000 ug | Freq: Once | INTRAMUSCULAR | Status: AC
  Administered 2021-07-22: 21:00:00 1000 ug via INTRAMUSCULAR
  Filled 2021-07-22: qty 1

## 2021-07-22 NOTE — Progress Notes (Signed)
Physical Therapy Treatment Patient Details Name: Patrick Kerr MRN: [id number] DOB: 15-Mar-1946 Today's Date: 07/22/2021   History of Present Illness Patrick Kerr is a 75 y.o. male with medical history significant for hypertension, BPH with history of TURP, hypothyroidism who presents to the emergency department accompanied by daughter due to weakness.  Patient was unable to provide history, history was obtained from ED physician and daughter at bedside.  Per report, patient recently had circumcision in October 2022 due to recent development of phimosis, patient did complain of burning penile sensation prior to and after circumcision.  Daughter states that patient also complained of worsening burning sensation in the rectum when he has a bowel movement.  Outside screening colonoscopy done in October at bedside showed small polyp and some internal hemorrhoids, but patient denies any rectal bleeding.  He was prescribed with 7-day course of doxycycline which was followed with Macrobid for possible prostate infection, but there was no improvement in symptoms. Foley catheter was placed on 11/4 and this was removed on 12/5. Per daughter, PCP was seen about 4 days ago and blood work done was reported to be positive for hypokalemia and anemia.  Patient continued to show some weakness, so daughter decided to take patient to the ED for further evaluation and management.  There was no report of fever, chest pain, shortness of breath, headache    PT Comments    Patient presents seated in chair (assisted by nursing staff via mechanical lift) and agreeable for therapy - his daughter present in room.  Patient demonstrates fair return for completing BLE ROM/strengthening exercises requiring active assistance and repeated verbal/tactile cueing, able to stand x 2 trials for up to 4-5 minutes with 2 person assist, but unable to fully extend trunk or take steps due to weakness.  Patient tolerated staying up in chair  after therapy - nursing staff to put patient back to bed via mechanical lift.  Patient will benefit from continued skilled physical therapy in hospital and recommended venue below to increase strength, balance, endurance for safe ADLs and gait.    Recommendations for follow up therapy are one component of a multi-disciplinary discharge planning process, led by the attending physician.  Recommendations may be updated based on patient status, additional functional criteria and insurance authorization.  Follow Up Recommendations  Skilled nursing-short term rehab (<3 hours/day)     Assistance Recommended at Discharge Intermittent Supervision/Assistance  Equipment Recommendations  None recommended by PT    Recommendations for Other Services       Precautions / Restrictions Precautions Precautions: Fall Restrictions Weight Bearing Restrictions: No     Mobility  Bed Mobility               General bed mobility comments: Patient presents seated in chair (assisted by nursing staff using mechanical lift)    Transfers Overall transfer level: Needs assistance Equipment used: Rolling walker (2 wheels) Transfers: Sit to/from Stand Sit to Stand: Max assist;+2 physical assistance           General transfer comment: as per OT notes    Ambulation/Gait                   Stairs             Wheelchair Mobility    Modified Rankin (Stroke Patients Only)       Balance Overall balance assessment: Needs assistance Sitting-balance support: Feet supported;No upper extremity supported Sitting balance-Leahy Scale: Good Sitting balance - Comments: seated in  chair   Standing balance support: Reliant on assistive device for balance;Bilateral upper extremity supported Standing balance-Leahy Scale: Poor Standing balance comment: poor using RW with 2 person assist                            Cognition Arousal/Alertness: Lethargic (Mildly lethargic; improved  today) Behavior During Therapy: WFL for tasks assessed/performed Overall Cognitive Status: Within Functional Limits for tasks assessed                                 General Comments: Pt more awake today but still lethargic at times when completing exercises.        Exercises General Exercises - Upper Extremity Shoulder Flexion: AAROM;5 reps;Seated (protraction x10 reps) General Exercises - Lower Extremity Ankle Circles/Pumps: Seated;AROM;Strengthening;Both;15 reps Long Arc Quad: Seated;AROM;Strengthening;Both;AAROM;10 reps Hip Flexion/Marching: Seated;AROM;AAROM;Strengthening;Both;10 reps    General Comments        Pertinent Vitals/Pain Pain Assessment: Faces Faces Pain Scale: No hurt Pain Location: bilateral knees with end range movement Pain Descriptors / Indicators: Sore Pain Intervention(s): Limited activity within patient's tolerance;Monitored during session;Repositioned    Home Living                          Prior Function            PT Goals (current goals can now be found in the care plan section) Acute Rehab PT Goals Patient Stated Goal: return home with family to assist PT Goal Formulation: With patient/family Time For Goal Achievement: 08/11/21 Potential to Achieve Goals: Good Progress towards PT goals: Progressing toward goals    Frequency    Min 3X/week      PT Plan Current plan remains appropriate    Co-evaluation PT/OT/SLP Co-Evaluation/Treatment: Yes Reason for Co-Treatment: Complexity of the patient's impairments (multi-system involvement);For patient/therapist safety;To address functional/ADL transfers PT goals addressed during session: Mobility/safety with mobility;Balance;Proper use of DME;Strengthening/ROM OT goals addressed during session: ADL's and self-care      AM-PAC PT "6 Clicks" Mobility   Outcome Measure  Help needed turning from your back to your side while in a flat bed without using bedrails?: A  Lot Help needed moving from lying on your back to sitting on the side of a flat bed without using bedrails?: A Lot Help needed moving to and from a bed to a chair (including a wheelchair)?: Total Help needed standing up from a chair using your arms (e.g., wheelchair or bedside chair)?: A Lot Help needed to walk in hospital room?: Total Help needed climbing 3-5 steps with a railing? : Total 6 Click Score: 9    End of Session Equipment Utilized During Treatment: Gait belt Activity Tolerance: Patient tolerated treatment well;Patient limited by fatigue;Patient limited by lethargy Patient left: in chair;with call bell/phone within reach;with family/visitor present Nurse Communication: Mobility status PT Visit Diagnosis: Unsteadiness on feet (R26.81);Other abnormalities of gait and mobility (R26.89);Muscle weakness (generalized) (M62.81)     Time: 7353-2992 PT Time Calculation (min) (ACUTE ONLY): 32 min  Charges:  $Therapeutic Exercise: 8-22 mins $Therapeutic Activity: 8-22 mins                     3:52 PM, 07/22/21 Ocie Bob, MPT Physical Therapist with [facility] [phone] office [phone] mobile phone

## 2021-07-22 NOTE — Progress Notes (Signed)
Speech Language Pathology Treatment: Dysphagia  Patient Details Name: Patrick Kerr MRN: [id number] DOB: Jul 07, 1946 Today's Date: 07/22/2021 Time: 2542-7062 SLP Time Calculation (min) (ACUTE ONLY): 28 min  Assessment / Plan / Recommendation Clinical Impression  Pt seen for ongoing dysphagia intervention following BSE completed yesterday. Pt with improved alertness today, however moving very slow. Oral care provided. Pt with abrasions to lips and mucous. Pt assessed with ice chips, water via cup/straw, puree, mech soft, and regular textures. Pt without overt signs of reduced airway protection, however is slow with oral prep with puree and solids. Recommend D3/mech soft and thin liquids with full supervision for all eating/drinking and pills whole in puree and only when Pt is alert and upright. Education provided to family and SLP will follow during acute stay.    HPI HPI: Patrick Kerr is a 75 y.o. male with medical history significant for hypertension, BPH with history of TURP, hypothyroidism who presents to the emergency department accompanied by daughter due to weakness.  Patient was unable to provide history, history was obtained from ED physician and daughter at bedside.  Per report, patient recently had circumcision in October 2022 due to recent development of phimosis, patient did complain of burning penile sensation prior to and after circumcision.  Daughter states that patient also complained of worsening burning sensation in the rectum when he has a bowel movement.  Outside screening colonoscopy done in October at bedside showed small polyp and some internal hemorrhoids, but patient denies any rectal bleeding.  He was prescribed with 7-day course of doxycycline which was followed with Macrobid for possible prostate infection, but there was no improvement in symptoms. Foley catheter was placed on 11/4 and this was removed on 12/5. Per daughter, PCP was seen about 4 days ago and blood work  done was reported to be positive for hypokalemia and anemia.  Patient continued to show some weakness, so daughter decided to take patient to the ED for further evaluation and management.  There was no report of fever, chest pain, shortness of breath, headache      SLP Plan  Continue with current plan of care      Recommendations for follow up therapy are one component of a multi-disciplinary discharge planning process, led by the attending physician.  Recommendations may be updated based on patient status, additional functional criteria and insurance authorization.    Recommendations  Diet recommendations: Dysphagia 3 (mechanical soft);Thin liquid Liquids provided via: Cup;Straw Medication Administration: Whole meds with puree Supervision: Staff to assist with self feeding;Full supervision/cueing for compensatory strategies Compensations: Slow rate Postural Changes and/or Swallow Maneuvers: Seated upright 90 degrees;Upright 30-60 min after meal                Oral Care Recommendations: Oral care BID;Staff/trained caregiver to provide oral care Follow Up Recommendations: Skilled nursing-short term rehab (<3 hours/day) Assistance recommended at discharge: Frequent or constant Supervision/Assistance SLP Visit Diagnosis: Dysphagia, unspecified (R13.10) Plan: Continue with current plan of care          Thank you,  Patrick Kerr, CCC-SLP [phone]  Patrick Kerr  07/22/2021, 3:59 PM

## 2021-07-22 NOTE — Progress Notes (Addendum)
PROGRESS NOTE    Patrick Kerr  ZOX:[id number] DOB: 09/28/1945 DOA: 07/17/2021 PCP: [facility]   Brief Narrative:   Patrick Kerr is a 75 y.o. male with medical history significant for hypertension, BPH with history of TURP, hypothyroidism who presents to the emergency department accompanied by daughter due to weakness.  Patient was unable to provide history, history was obtained from ED physician and daughter at bedside.  Per report, patient recently had circumcision in October 2022 due to recent development of phimosis, patient did complain of burning penile sensation prior to and after circumcision.  He was prescribed a 7-day course of doxycycline followed with Macrobid for possible prostate infection through the [place] system and had urine culture demonstrating Klebsiella that was sensitive to Rocephin.  He continued to have ongoing weakness despite use of antibiotics.  He was admitted with septic shock present on admission secondary to Klebsiella UTI.  His sepsis physiology is now improving and he has been extubated on 12/20.  Assessment & Plan:   Principal Problem:   Sepsis secondary to UTI [facility]) Active Problems:   Macrocytic anemia   Hypokalemia   Thrombocytopenia (HCC)   Bladder outlet obstruction   Low TSH level   Lactic acidosis   Obesity (BMI 30.0-34.9)   Essential hypertension   BPH (benign prostatic hyperplasia)   Acquired hypothyroidism   Septic shock secondary to UTI-Klebsiella, present on admission -Briefly did require vasopressors, norepinephrine has been weaned off -Continue IV Rocephin at 2 g dose daily with resolution of fever day 6/10 -Blood cultures with no growth to date -Lactic acid has normalized -Since blood pressure is stable, will hold off on further IV fluids since he is developing anasarca   Urinary retention related to above-acute -Status post coud catheter placement -Discussed with Dr. Ronne Binning, urology who recommended to continue  current catheter until he is able to follow-up with urology as an outpatient   Intractable nausea and vomiting with hypotension related to above factors -Patient intubated for airway protection -Patient extubated on 12/20 -Appreciate PCCM assistance  Acute metabolic encephalopathy -Suspect related to medications given while patient was mechanically ventilated, also likely has component related to infectious process -Appears to be slowly improving  AKI -Mild with creatinine increasing to 1.3 -Now improved to 0.8 -Suspect related to hemodynamic instabilities/sepsis   Hypokalemia -Replete and continue monitoring   Prolonged QTC -Avoid QT prolonging medications and supplement electrolytes   Thrombocytopenia -Suspect related to infectious process -Levels appear to be plateauing -Continue monitoring   Essential hypertension -Using as needed hydralazine for elevated pressures -Blood pressures currently stable   Hypothyroidism -Continue Synthroid   BPH -On Flomax  Dysphagia -Seen by speech therapy and started on dysphagia 3 diet  Generalized weakness -PT/OT -Daughter is wanting to take him home with home health aides     DVT prophylaxis: SCDs Code Status: Full Family Communication: Spoke with daughter at bedside 12/22 Disposition Plan:  Status is: Inpatient   Remains inpatient appropriate because: Critical illness with need for IV antibiotics and intubation.   Consultants:  PCCM   Procedures:  12/18 intubation 12/20 extubation  Antimicrobials:  Anti-infectives (From admission, onward)    Start     Dose/Rate Route Frequency Ordered Stop   07/19/21 0700  cefTRIAXone (ROCEPHIN) 2 g in sodium chloride 0.9 % 100 mL IVPB        2 g 200 mL/hr over 30 Minutes Intravenous Every 24 hours 07/18/21 1157     07/18/21 0800  cefTRIAXone (ROCEPHIN) 2  g in sodium chloride 0.9 % 100 mL IVPB  Status:  Discontinued        2 g 200 mL/hr over 30 Minutes Intravenous Every 24  hours 07/18/21 0711 07/18/21 1106   07/17/21 1645  cefTRIAXone (ROCEPHIN) 1 g in sodium chloride 0.9 % 100 mL IVPB        1 g 200 mL/hr over 30 Minutes Intravenous  Once 07/17/21 1632 07/17/21 1835   07/17/21 0000  cephALEXin (KEFLEX) 500 MG capsule        500 mg Oral 4 times daily 07/17/21 1736         Subjective: He is less somnolent today.  He did spend some time up in the chair.  Started take p.o. intake.  Objective: Vitals:   07/22/21 1154 07/22/21 1619 07/22/21 1800 07/22/21 2022  BP:   139/60   Pulse: 84 96 83   Resp: 17 18 16    Temp:  98.3 F (36.8 C)  98.1 F (36.7 C)  TempSrc:  Oral  Axillary  SpO2: 93% 95% 93%   Weight:      Height:        Intake/Output Summary (Last 24 hours) at 07/22/2021 2036 Last data filed at 07/22/2021 1702 Gross per 24 hour  Intake 99.97 ml  Output 4600 ml  Net -4500.03 ml   Filed Weights   07/20/21 0332 07/21/21 0429 07/22/21 0500  Weight: 130.8 kg 129.7 kg 127.5 kg    Examination:  General exam: Still somnolent today, but is waking up to voice and answering questions, was up in the chair earlier today. Respiratory system: Clear to auscultation. Respiratory effort normal. Cardiovascular system:RRR. No murmurs, rubs, gallops. Gastrointestinal system: Abdomen is nondistended, soft and nontender. No organomegaly or masses felt. Normal bowel sounds heard. Central nervous system: No focal neurological deficits. Extremities: Anasarca is improving Skin: No rashes, lesions or ulcers Psychiatry: Somnolent, but does wake up to voice and answers questions     Data Reviewed: I have personally reviewed following labs and imaging studies  CBC: Recent Labs  Lab 07/17/21 1032 07/18/21 0550 07/19/21 0431 07/20/21 0430 07/21/21 0401 07/22/21 0431  WBC 5.3 12.5* 19.3* 13.9* 11.2* 8.1  NEUTROABS 3.4  --   --   --   --   --   HGB 11.5* 11.5* 11.6* 10.2* 9.4* 10.4*  HCT 34.7* 34.5* 35.4* 31.6* 29.6* 32.1*  MCV 104.8* 106.5* 104.7*  106.8* 108.8* 108.4*  PLT 140* 124* 95* 82* 68* 76*   Basic Metabolic Panel: Recent Labs  Lab 07/18/21 0550 07/19/21 0431 07/20/21 0430 07/21/21 0401 07/22/21 0431  NA 137 134* 135 138 140  K 2.4* 3.3* 3.1* 3.6 3.5  CL 107 104 106 107 107  CO2 18* 19* 22 25 25   GLUCOSE 196* 126* 136* 85 84  BUN 17 23 29* 29* 24*  CREATININE 1.35* 1.11 1.00 0.87 0.85  CALCIUM 8.3* 8.0* 7.8* 8.0* 8.3*  MG 1.5* 1.7 2.1 1.8 1.6*  PHOS 1.6*  --  2.9  --  3.0   GFR: Estimated Creatinine Clearance: 107.8 mL/min (by C-G formula based on SCr of 0.85 mg/dL). Liver Function Tests: Recent Labs  Lab 07/18/21 0550 07/19/21 0431 07/20/21 0430 07/21/21 0401 07/22/21 0431  AST 97* 90* 57* 58* 53*  ALT 45* 48* 39 42 44  ALKPHOS 63 55 54 69 80  BILITOT 1.7* 1.6* 1.6* 1.4* 0.9  PROT 5.9* 5.6* 5.1* 5.3* 5.7*  ALBUMIN 3.0* 2.5* 2.3* 2.4* 2.6*   No results for input(s):  LIPASE, AMYLASE in the last 168 hours. No results for input(s): AMMONIA in the last 168 hours. Coagulation Profile: No results for input(s): INR, PROTIME in the last 168 hours. Cardiac Enzymes: No results for input(s): CKTOTAL, CKMB, CKMBINDEX, TROPONINI in the last 168 hours. BNP (last 3 results) No results for input(s): PROBNP in the last 8760 hours. HbA1C: No results for input(s): HGBA1C in the last 72 hours. CBG: Recent Labs  Lab 07/21/21 0424 07/21/21 0730 07/21/21 1138 07/21/21 1536 07/22/21 1621  GLUCAP 82 86 84 84 142*   Lipid Profile: Recent Labs    07/20/21 0430  TRIG 562*   Thyroid Function Tests: No results for input(s): TSH, T4TOTAL, FREET4, T3FREE, THYROIDAB in the last 72 hours. Anemia Panel: No results for input(s): VITAMINB12, FOLATE, FERRITIN, TIBC, IRON, RETICCTPCT in the last 72 hours.  Sepsis Labs: Recent Labs  Lab 07/18/21 0745 07/18/21 1047 07/18/21 1621 07/19/21 0431 07/20/21 0430 07/21/21 0401  PROCALCITON 55.58  --   --  39.86 25.00  --   LATICACIDVEN 2.7*   < > 3.4* 3.3* 2.3* 0.9   <  > = values in this interval not displayed.    Recent Results (from the past 240 hour(s))  Urine Culture     Status: Abnormal   Collection Time: 07/17/21  3:46 PM   Specimen: Urine, Clean Catch  Result Value Ref Range Status   Specimen Description   Final    URINE, CLEAN CATCH Performed at [facility], [address], [place], [place] [address]    Special Requests   Final    NONE Performed at [facility], [address], [place], [place] [address]    Culture >=100,000 COLONIES/mL KLEBSIELLA PNEUMONIAE (A)  Final   Report Status 07/20/2021 FINAL  Final   Organism ID, Bacteria KLEBSIELLA PNEUMONIAE (A)  Final      Susceptibility   Klebsiella pneumoniae - MIC*    AMPICILLIN >=32 RESISTANT Resistant     CEFAZOLIN <=4 SENSITIVE Sensitive     CEFEPIME <=0.12 SENSITIVE Sensitive     CEFTRIAXONE <=0.25 SENSITIVE Sensitive     CIPROFLOXACIN <=0.25 SENSITIVE Sensitive     GENTAMICIN <=1 SENSITIVE Sensitive     IMIPENEM <=0.25 SENSITIVE Sensitive     NITROFURANTOIN 64 INTERMEDIATE Intermediate     TRIMETH/SULFA <=20 SENSITIVE Sensitive     AMPICILLIN/SULBACTAM 16 INTERMEDIATE Intermediate     PIP/TAZO 16 SENSITIVE Sensitive     * >=100,000 COLONIES/mL KLEBSIELLA PNEUMONIAE  Resp Panel by RT-PCR (Flu A&B, Covid) Nasopharyngeal Swab     Status: None   Collection Time: 07/17/21  4:37 PM   Specimen: Nasopharyngeal Swab; Nasopharyngeal(NP) swabs in vial transport medium  Result Value Ref Range Status   SARS Coronavirus 2 by RT PCR NEGATIVE NEGATIVE Final    Comment: (NOTE) SARS-CoV-2 target nucleic acids are NOT DETECTED.  The SARS-CoV-2 RNA is generally detectable in upper respiratory specimens during the acute phase of infection. The lowest concentration of SARS-CoV-2 viral copies this assay can detect is 138 copies/mL. A negative result does not preclude SARS-Cov-2 infection and should not be used as the sole basis for treatment or other patient management decisions. A negative result  may occur with  improper specimen collection/handling, submission of specimen other than nasopharyngeal swab, presence of viral mutation(s) within the areas targeted by this assay, and inadequate number of viral copies(<138 copies/mL). A negative result must be combined with clinical observations, patient history, and epidemiological information. The expected result is Negative.  Fact Sheet for Patients:  BloggerCourse.com  Fact Sheet for Healthcare Providers:  SeriousBroker.it  This test is no t yet approved or cleared by the [place] FDA and  has been authorized for detection and/or diagnosis of SARS-CoV-2 by FDA under an Emergency Use Authorization (EUA). This EUA will remain  in effect (meaning this test can be used) for the duration of the COVID-19 declaration under Section 564(b)(1) of the Act, 21 U.S.C.section 360bbb-3(b)(1), unless the authorization is terminated  or revoked sooner.       Influenza A by PCR NEGATIVE NEGATIVE Final   Influenza B by PCR NEGATIVE NEGATIVE Final    Comment: (NOTE) The Xpert Xpress SARS-CoV-2/FLU/RSV plus assay is intended as an aid in the diagnosis of influenza from Nasopharyngeal swab specimens and should not be used as a sole basis for treatment. Nasal washings and aspirates are unacceptable for Xpert Xpress SARS-CoV-2/FLU/RSV testing.  Fact Sheet for Patients: BloggerCourse.com  Fact Sheet for Healthcare Providers: SeriousBroker.it  This test is not yet approved or cleared by the [place] FDA and has been authorized for detection and/or diagnosis of SARS-CoV-2 by FDA under an Emergency Use Authorization (EUA). This EUA will remain in effect (meaning this test can be used) for the duration of the COVID-19 declaration under Section 564(b)(1) of the Act, 21 U.S.C. section 360bbb-3(b)(1), unless the authorization is terminated  or revoked.  Performed at [facility], [address], [place], [place] [address]   Blood culture (routine x 2)     Status: None   Collection Time: 07/17/21  6:34 PM   Specimen: Right Antecubital; Blood  Result Value Ref Range Status   Specimen Description   Final    RIGHT ANTECUBITAL BOTTLES DRAWN AEROBIC AND ANAEROBIC   Special Requests Blood Culture adequate volume  Final   Culture   Final    NO GROWTH 5 DAYS Performed at [facility], [address] [place], [place] [address]    Report Status 07/22/2021 FINAL  Final  Blood culture (routine x 2)     Status: None   Collection Time: 07/17/21  6:41 PM   Specimen: Left Antecubital; Blood  Result Value Ref Range Status   Specimen Description   Final    LEFT ANTECUBITAL BOTTLES DRAWN AEROBIC AND ANAEROBIC   Special Requests   Final    Blood Culture results may not be optimal due to an excessive volume of blood received in culture bottles   Culture   Final    NO GROWTH 5 DAYS Performed at [facility], [address] [place], [place] [address]    Report Status 07/22/2021 FINAL  Final  MRSA Next Gen by PCR, Nasal     Status: None   Collection Time: 07/18/21  5:32 AM   Specimen: Nasal Mucosa; Nasal Swab  Result Value Ref Range Status   MRSA by PCR Next Gen NOT DETECTED NOT DETECTED Final    Comment: (NOTE) The GeneXpert MRSA Assay (FDA approved for NASAL specimens only), is one component of a comprehensive MRSA colonization surveillance program. It is not intended to diagnose MRSA infection nor to guide or monitor treatment for MRSA infections. Test performance is not FDA approved in patients less than 97 years old. Performed at [facility], [address] [place], [place] [address]          Radiology Studies: No results found.      Scheduled Meds:  ARIPiprazole  5 mg Oral QHS   Chlorhexidine Gluconate Cloth  6 each Topical Daily   levothyroxine  137 mcg Oral Q0600   mouth rinse  15 mL Mouth Rinse BID   [START  ON 07/23/2021] polyethylene glycol  17 g Oral Daily   potassium & sodium phosphates  1 packet Per NG tube TID   sertraline  200 mg Oral Daily   tamsulosin  0.4 mg Oral Daily   Continuous Infusions:  cefTRIAXone (ROCEPHIN)  IV Stopped (07/22/21 1017)     LOS: 4 days    Time spent: 35 minutes    Erick Blinks, MD [facility]  If 7PM-7AM, please contact night-coverage [email or web address] 07/22/2021, 8:36 PM

## 2021-07-22 NOTE — Progress Notes (Signed)
Occupational Therapy Treatment Patient Details Name: Patrick Kerr MRN: [id number] DOB: 1945/12/01 Today's Date: 07/22/2021   History of present illness Patrick Kerr is a 75 y.o. male with medical history significant for hypertension, BPH with history of TURP, hypothyroidism who presents to the emergency department accompanied by daughter due to weakness.  Patient was unable to provide history, history was obtained from ED physician and daughter at bedside.  Per report, patient recently had circumcision in October 2022 due to recent development of phimosis, patient did complain of burning penile sensation prior to and after circumcision.  Daughter states that patient also complained of worsening burning sensation in the rectum when he has a bowel movement.  Outside screening colonoscopy done in October at bedside showed small polyp and some internal hemorrhoids, but patient denies any rectal bleeding.  He was prescribed with 7-day course of doxycycline which was followed with Macrobid for possible prostate infection, but there was no improvement in symptoms. Foley catheter was placed on 11/4 and this was removed on 12/5. Per daughter, PCP was seen about 4 days ago and blood work done was reported to be positive for hypokalemia and anemia.  Patient continued to show some weakness, so daughter decided to take patient to the ED for further evaluation and management.  There was no report of fever, chest pain, shortness of breath, headache   OT comments  Pt agreeable to co-treat with PT today. Pt more awake this date with mild lethargy at times while seated in chair. Pt able to complete two reps of sit to stand with assist +2 using RW. Pt noted to have improved standing balance and tolerance during second attempt at standing. Pt demonstrates improved B UE A/ROM today at ~75% of available range. Pt completed B UE exercises with assist to reach full range of motion for flexion. Pt left In chair with family  present.    Recommendations for follow up therapy are one component of a multi-disciplinary discharge planning process, led by the attending physician.  Recommendations may be updated based on patient status, additional functional criteria and insurance authorization.    Follow Up Recommendations  Skilled nursing-short term rehab (<3 hours/day)    Assistance Recommended at Discharge Frequent or constant Supervision/Assistance  Equipment Recommendations  BSC/3in1          Precautions / Restrictions Precautions Precautions: Fall Restrictions Weight Bearing Restrictions: No       Mobility                     Transfers Overall transfer level: Needs assistance Equipment used: Rolling walker (2 wheels) Transfers: Sit to/from Stand Sit to Stand: Max assist;+2 physical assistance           General transfer comment: Pt able to complete 2 reps of sit to stand from chair with RW.     Balance Overall balance assessment: Needs assistance Sitting-balance support: Feet supported;Bilateral upper extremity supported Sitting balance-Leahy Scale: Good Sitting balance - Comments: seated in chair   Standing balance support: Bilateral upper extremity supported;During functional activity;Reliant on assistive device for balance Standing balance-Leahy Scale: Poor Standing balance comment: poor using RW                              Cognition Arousal/Alertness: Lethargic (Mildly lethargic; improved today) Behavior During Therapy: WFL for tasks assessed/performed Overall Cognitive Status: Within Functional Limits for tasks assessed  General Comments: Pt more awake today but still lethargic at times when completing exercises.          Exercises Exercises: General Upper Extremity General Exercises - Upper Extremity Shoulder Flexion: AAROM;5 reps;Seated (protraction x10 reps)                Pertinent Vitals/  Pain       Pain Assessment: Faces Faces Pain Scale: No hurt                                                          Frequency  Min 2X/week        Progress Toward Goals  OT Goals(current goals can now be found in the care plan section)  Progress towards OT goals: Progressing toward goals  ADL Goals Pt Will Perform Eating: with supervision;bed level;sitting Pt Will Perform Grooming: sitting;with min assist Pt Will Perform Upper Body Bathing: sitting;with min assist;with adaptive equipment Pt Will Perform Lower Body Bathing: sitting/lateral leans;with mod assist;with adaptive equipment Pt Will Perform Upper Body Dressing: with min assist;sitting Pt Will Perform Lower Body Dressing: with mod assist;with adaptive equipment;sitting/lateral leans Pt Will Transfer to Toilet: with mod assist;stand pivot transfer;bedside commode Pt/caregiver will Perform Home Exercise Program: Increased ROM;Increased strength;Both right and left upper extremity;With minimal assist  Plan Discharge plan remains appropriate    Co-evaluation    PT/OT/SLP Co-Evaluation/Treatment: Yes Reason for Co-Treatment: Complexity of the patient's impairments (multi-system involvement)   OT goals addressed during session: ADL's and self-care                        End of Session Equipment Utilized During Treatment: Rolling walker (2 wheels)  OT Visit Diagnosis: Unsteadiness on feet (R26.81);Other abnormalities of gait and mobility (R26.89);Muscle weakness (generalized) (M62.81)   Activity Tolerance Patient tolerated treatment well   Patient Left in chair;with call bell/phone within reach;with family/visitor present             Time: 0109-3235 OT Time Calculation (min): 23 min  Charges: OT General Charges $OT Visit: 1 Visit OT Treatments $Self Care/Home Management : 8-22 mins  [facility] OT, MOT  Danie Chandler 07/22/2021, 3:36 PM

## 2021-07-23 DIAGNOSIS — E039 Hypothyroidism, unspecified: Secondary | ICD-10-CM | POA: Diagnosis not present

## 2021-07-23 DIAGNOSIS — N320 Bladder-neck obstruction: Secondary | ICD-10-CM | POA: Diagnosis not present

## 2021-07-23 DIAGNOSIS — A419 Sepsis, unspecified organism: Secondary | ICD-10-CM | POA: Diagnosis not present

## 2021-07-23 DIAGNOSIS — N3001 Acute cystitis with hematuria: Secondary | ICD-10-CM | POA: Diagnosis not present

## 2021-07-23 LAB — CBC
HCT: 30.9 % — ABNORMAL LOW (ref 39.0–52.0)
Hemoglobin: 10.1 g/dL — ABNORMAL LOW (ref 13.0–17.0)
MCH: 35.1 pg — ABNORMAL HIGH (ref 26.0–34.0)
MCHC: 32.7 g/dL (ref 30.0–36.0)
MCV: 107.3 fL — ABNORMAL HIGH (ref 80.0–100.0)
Platelets: 100 10*3/uL — ABNORMAL LOW (ref 150–400)
RBC: 2.88 MIL/uL — ABNORMAL LOW (ref 4.22–5.81)
RDW: 11.9 % (ref 11.5–15.5)
WBC: 6.9 10*3/uL (ref 4.0–10.5)
nRBC: 0 % (ref 0.0–0.2)

## 2021-07-23 LAB — BASIC METABOLIC PANEL
Anion gap: 9 (ref 5–15)
BUN: 23 mg/dL (ref 8–23)
CO2: 25 mmol/L (ref 22–32)
Calcium: 8.2 mg/dL — ABNORMAL LOW (ref 8.9–10.3)
Chloride: 105 mmol/L (ref 98–111)
Creatinine, Ser: 0.68 mg/dL (ref 0.61–1.24)
GFR, Estimated: >60 mL/min (ref >60)
Glucose, Bld: 95 mg/dL (ref 70–99)
Potassium: 3.4 mmol/L — ABNORMAL LOW (ref 3.5–5.1)
Sodium: 139 mmol/L (ref 135–145)

## 2021-07-23 LAB — MAGNESIUM: Magnesium: 1.8 mg/dL (ref 1.7–2.4)

## 2021-07-23 MED ORDER — CEFDINIR 300 MG PO CAPS
300.0000 mg | ORAL_CAPSULE | Freq: Two times a day (BID) | ORAL | Status: DC
  Administered 2021-07-24 – 2021-07-27 (×7): 300 mg via ORAL
  Filled 2021-07-23 (×7): qty 1

## 2021-07-23 MED ORDER — POTASSIUM CHLORIDE 20 MEQ PO PACK
40.0000 meq | PACK | Freq: Once | ORAL | Status: AC
  Administered 2021-07-23: 21:00:00 40 meq via ORAL
  Filled 2021-07-23: qty 2

## 2021-07-23 NOTE — TOC Initial Note (Signed)
Transition of Care [facility]) - Initial/Assessment Note    Patient Details  Name: Patrick Kerr MRN: [id number] Date of Birth: 1946/07/30  Transition of Care [facility]) CM/SW Contact:    Annice Needy, LCSW Phone Number: 07/23/2021, 4:21 PM  Clinical Narrative:                 Patient from home with spouse. Ms. Rosalyn Charters, daughter, requests lift, wc, hospital bed, 3n1. Agreeable to order via [employer] after choices provided. Agreeable to [facility] for [facility] needs as [facility] does not serve her area.  Attending aware of orders needed.   Expected Discharge Plan: Home w Home Health Services Barriers to Discharge: Continued Medical Work up   Patient Goals and CMS Choice Patient states their goals for this hospitalization and ongoing recovery are:: home with [facility] and private care givers      Expected Discharge Plan and Services Expected Discharge Plan: Home w Home Health Services     Post Acute Care Choice: Durable Medical Equipment, Home Health Living arrangements for the past 2 months: Single Family Home                 DME Arranged: 3-N-1, Hospital bed, Wheelchair manual (Lift) DME Agency: [facility] Date DME Agency Contacted: 07/23/21 Time DME Agency Contacted: [phone] Representative spoke with at DME Agency: Morrie Sheldon HH Arranged: RN, PT, OT, Speech Therapy HH Agency: [facility] Date HH Agency Contacted: 07/23/21 Time HH Agency Contacted: [phone] Representative spoke with at [facility] Agency: Jerilynn Som  Prior Living Arrangements/Services Living arrangements for the past 2 months: Single Family Home Lives with:: Spouse Patient language and need for interpreter reviewed:: Yes        Need for Family Participation in Patient Care: Yes (Comment) Care giver support system in place?: Yes (comment)   Criminal Activity/Legal Involvement Pertinent to Current Situation/Hospitalization: No - Comment as needed  Activities of Daily Living Home Assistive Devices/Equipment: Eyeglasses  (walker) ADL Screening (condition at time of admission) Patient's cognitive ability adequate to safely complete daily activities?: No Is the patient deaf or have difficulty hearing?: No Does the patient have difficulty seeing, even when wearing glasses/contacts?: No Does the patient have difficulty concentrating, remembering, or making decisions?: No Patient able to express need for assistance with ADLs?: Yes Does the patient have difficulty dressing or bathing?: Yes Independently performs ADLs?: No Communication: Independent Dressing (OT): Needs assistance Is this a change from baseline?: Pre-admission baseline Grooming: Needs assistance Is this a change from baseline?: Pre-admission baseline Feeding: Needs assistance Is this a change from baseline?: Pre-admission baseline Bathing: Needs assistance Is this a change from baseline?: Pre-admission baseline Toileting: Needs assistance Is this a change from baseline?: Pre-admission baseline In/Out Bed: Independent with device (comment) Walks in Home: Independent with device (comment) Does the patient have difficulty walking or climbing stairs?: Yes Weakness of Legs: Both Weakness of Arms/Hands: Both  Permission Sought/Granted Permission sought to share information with : Family Supports    Share Information with NAME: daughter Loman Chroman           Emotional Assessment         Alcohol / Substance Use: Not Applicable Psych Involvement: No (comment)  Admission diagnosis:  Encounter for intubation [Z01.818] Acute cystitis with hematuria [N30.01] Right ureteral stone [N20.1] Sepsis secondary to UTI (HCC) [A41.9, N39.0] Sepsis without acute organ dysfunction, due to unspecified organism [facility]) [A41.9] Patient Active Problem List   Diagnosis Date Noted   Sepsis secondary to UTI (HCC) 07/18/2021   Macrocytic  anemia 07/18/2021   Hypokalemia 07/18/2021   Thrombocytopenia ([place]) 07/18/2021   Bladder outlet obstruction 07/18/2021    Low TSH level 07/18/2021   Lactic acidosis 07/18/2021   Obesity (BMI 30.0-34.9) 07/18/2021   Essential hypertension 07/18/2021   BPH (benign prostatic hyperplasia) 07/18/2021   Acquired hypothyroidism 07/18/2021   Protrusion of cervical intervertebral disc 07/20/2017   Status post lumbar spinal fusion 07/20/2017   PCP:  [place]:   [id number] [place], [place] [place] [id number] Phone: [phone] Fax: [phone]  [id number] [place] [address] Phone: [phone] Fax: [phone]     Social Determinants of Health (SDOH) Interventions    Readmission Risk Interventions No flowsheet data found.

## 2021-07-23 NOTE — Progress Notes (Signed)
Speech Language Pathology Treatment: Dysphagia  Patient Details Name: Patrick Kerr MRN: [id number] DOB: 1945-10-29 Today's Date: 07/23/2021 Time: 7829-5621 SLP Time Calculation (min) (ACUTE ONLY): 22 min  Assessment / Plan / Recommendation Clinical Impression  Pt seen for dysphagia tx with min cues for volume control/compensatory strategies to assist with oral prep/clearance with various consistencies including Dysphagia 3, puree, regular and thin via cup/straw with anterior spillage noted with cup sips d/t swollen/bruised labial area with impaired mastication noted with solids/Dysphagia 3 with family stating to nursing staff it was difficult for pt to chew current diet consistency d/t pain, so Dysphagia 2/thin may be beneficial until pt has decreased pain with mastication and improves preparation/propulsion with regular or mech/soft consistencies.  Pt exhibited one incident of delayed coughing, but this was after all intake, which may be d/t pharyngeal weakness/deconditioning.  Recommend Dysphagia 2 (minced)/thin liquid diet until pt improves orally with ST f/u x1 for diet tolerance while in acute setting.   HPI HPI: Patrick Kerr is a 75 y.o. male with medical history significant for hypertension, BPH with history of TURP, hypothyroidism who presents to the emergency department accompanied by daughter due to weakness.  Patient was unable to provide history, history was obtained from ED physician and daughter at bedside.  Per report, patient recently had circumcision in October 2022 due to recent development of phimosis, patient did complain of burning penile sensation prior to and after circumcision.  Daughter states that patient also complained of worsening burning sensation in the rectum when he has a bowel movement.  Outside screening colonoscopy done in October at bedside showed small polyp and some internal hemorrhoids, but patient denies any rectal bleeding.  He was prescribed with 7-day  course of doxycycline which was followed with Macrobid for possible prostate infection, but there was no improvement in symptoms. Foley catheter was placed on 11/4 and this was removed on 12/5. Per daughter, PCP was seen about 4 days ago and blood work done was reported to be positive for hypokalemia and anemia.  Patient continued to show some weakness, so daughter decided to take patient to the ED for further evaluation and management.  There was no report of fever, chest pain, shortness of breath, headache      SLP Plan  Continue with current plan of care      Recommendations for follow up therapy are one component of a multi-disciplinary discharge planning process, led by the attending physician.  Recommendations may be updated based on patient status, additional functional criteria and insurance authorization.    Recommendations  Diet recommendations: Dysphagia 2 (fine chop);Thin liquid (per family request) Liquids provided via: Cup;Straw Medication Administration: Whole meds with puree Supervision: Staff to assist with self feeding;Full supervision/cueing for compensatory strategies Compensations: Slow rate;Small sips/bites Postural Changes and/or Swallow Maneuvers: Seated upright 90 degrees;Upright 30-60 min after meal                Oral Care Recommendations: Oral care BID;Staff/trained caregiver to provide oral care Follow Up Recommendations: Skilled nursing-short term rehab (<3 hours/day) Assistance recommended at discharge: Frequent or constant Supervision/Assistance SLP Visit Diagnosis: Dysphagia, unspecified (R13.10) Plan: Continue with current plan of care           Tressie Stalker, M.S., CCC-SLP  07/23/2021, 2:13 PM

## 2021-07-23 NOTE — Progress Notes (Signed)
Patrick Kerr, Patrick Kerr   Brief Narrative:   Patrick Kerr is a 75 y.o. male with Kerr history significant for hypertension, BPH with history of TURP, hypothyroidism who presents to the emergency department accompanied by daughter due to weakness.  Patient was unable to provide history, history was obtained from ED physician and daughter at bedside.  Per report, patient recently had circumcision in October 2022 due to recent development of phimosis, patient did complain of burning penile sensation prior to and after circumcision.  He was prescribed a 7-day course of doxycycline followed with Macrobid for possible prostate infection through the [place] system and had urine culture demonstrating Klebsiella that was sensitive to Rocephin.  He continued to have ongoing weakness despite use of antibiotics.  He was admitted with septic shock present on admission secondary to Klebsiella UTI.  His sepsis physiology is now improving and he has been extubated on 12/20.  Assessment & Plan:   Principal Problem:   Sepsis secondary to UTI [facility]) Active Problems:   Macrocytic anemia   Hypokalemia   Thrombocytopenia (HCC)   Bladder outlet obstruction   Low TSH level   Lactic acidosis   Obesity (BMI 30.0-34.9)   Essential hypertension   BPH (benign prostatic hyperplasia)   Acquired hypothyroidism   Septic shock secondary to UTI-Klebsiella, present on admission -Briefly did require vasopressors, norepinephrine has been weaned off -Continue IV Rocephin at 2 g dose daily with resolution of fever day 6/10 -Blood cultures with no growth to date -Lactic acid has normalized -Since blood pressure is stable, will hold off on further IV fluids since he is developing anasarca   Urinary retention related to above-acute -Status post coud catheter placement -Discussed with Dr. Ronne Binning, urology who recommended to continue  current catheter until he is able to follow-up with urology as an outpatient   Intractable nausea and vomiting with hypotension related to above factors -Patient intubated for airway protection -Patient extubated on 12/20 -Appreciate PCCM assistance  Acute metabolic encephalopathy -Suspect related to medications given while patient was mechanically ventilated, also likely has component related to infectious process -Appears to be slowly improving  AKI -Mild with creatinine increasing to 1.3 -Now improved to 0.6 -Suspect related to hemodynamic instabilities/sepsis   Hypokalemia -Replete and continue monitoring   Prolonged QTC -Avoid QT prolonging medications and supplement electrolytes   Thrombocytopenia -Suspect related to infectious process -Levels appear to be plateauing -Continue monitoring   Essential hypertension -Using as needed hydralazine for elevated pressures -Blood pressures currently stable   Hypothyroidism -Continue Synthroid   BPH -On Flomax  Dysphagia -Seen by speech therapy and started on dysphagia 2 diet  Generalized weakness -PT/OT -Daughter is wanting to take him home with home health aides     DVT prophylaxis: SCDs Code Status: Full Family Communication: Spoke with daughter at bedside 12/23 Disposition Plan:  Status is: Inpatient   Remains inpatient appropriate because: Critical illness with need for IV antibiotics and intubation.   Consultants:  PCCM   Procedures:  12/18 intubation 12/20 extubation  Antimicrobials:  Anti-infectives (From admission, onward)    Start     Dose/Rate Route Frequency Ordered Stop   07/24/21 1000  cefdinir (OMNICEF) capsule 300 mg        300 mg Oral Every 12 hours 07/23/21 1019 07/28/21 0959   07/19/21 0700  cefTRIAXone (ROCEPHIN) 2 g in sodium chloride 0.9 % 100 mL IVPB  Status:  Discontinued        2 g 200 mL/hr over 30 Minutes Intravenous Every 24 hours 07/18/21 1157 07/23/21 1019   07/18/21 0800   cefTRIAXone (ROCEPHIN) 2 g in sodium chloride 0.9 % 100 mL IVPB  Status:  Discontinued        2 g 200 mL/hr over 30 Minutes Intravenous Every 24 hours 07/18/21 0711 07/18/21 1106   07/17/21 1645  cefTRIAXone (ROCEPHIN) 1 g in sodium chloride 0.9 % 100 mL IVPB        1 g 200 mL/hr over 30 Minutes Intravenous  Once 07/17/21 1632 07/17/21 1835   07/17/21 0000  cephALEXin (KEFLEX) 500 MG capsule        500 mg Oral 4 times daily 07/17/21 1736         Subjective: He is sitting up in bed.  Says he is feeling better.  P.o. intake is improving.  Denies any shortness of breath.  Objective: Vitals:   07/23/21 1500 07/23/21 1600 07/23/21 1700 07/23/21 1900  BP: (!) 127/57 (!) 122/50 134/72 (!) 132/59  Pulse: 80 77 89 84  Resp: 17 15 (!) 23 20  Temp:   98.3 F (36.8 C) 98 F (36.7 C)  TempSrc:   Oral Axillary  SpO2: 96% 96% 96% 94%  Weight:      Height:        Intake/Output Summary (Last 24 hours) at 07/23/2021 1921 Last data filed at 07/23/2021 0900 Gross per 24 hour  Intake 240 ml  Output 500 ml  Net -260 ml   Filed Weights   07/20/21 0332 07/21/21 0429 07/22/21 0500  Weight: 130.8 kg 129.7 kg 127.5 kg    Examination:  General exam: Sitting up in bed, no distress Respiratory system: Clear to auscultation. Respiratory effort normal. Cardiovascular system:RRR. No murmurs, rubs, gallops. Gastrointestinal system: Abdomen is nondistended, soft and nontender. No organomegaly or masses felt. Normal bowel sounds heard. Central nervous system: No focal neurological deficits. Extremities: Anasarca is improving Skin: No rashes, lesions or ulcers Psychiatry: Awake, pleasant, appropriate     Data Reviewed: I have personally reviewed following labs and imaging studies  CBC: Recent Labs  Lab 07/17/21 1032 07/18/21 0550 07/19/21 0431 07/20/21 0430 07/21/21 0401 07/22/21 0431 07/23/21 0618  WBC 5.3   < > 19.3* 13.9* 11.2* 8.1 6.9  NEUTROABS 3.4  --   --   --   --   --   --    HGB 11.5*   < > 11.6* 10.2* 9.4* 10.4* 10.1*  HCT 34.7*   < > 35.4* 31.6* 29.6* 32.1* 30.9*  MCV 104.8*   < > 104.7* 106.8* 108.8* 108.4* 107.3*  PLT 140*   < > 95* 82* 68* 76* 100*   < > = values in this interval not displayed.   Basic Metabolic Panel: Recent Labs  Lab 07/18/21 0550 07/19/21 0431 07/20/21 0430 07/21/21 0401 07/22/21 0431 07/23/21 0618  NA 137 134* 135 138 140 139  K 2.4* 3.3* 3.1* 3.6 3.5 3.4*  CL 107 104 106 107 107 105  CO2 18* 19* GLUCOSE 196* 126* 136* 85 84 95  BUN 17 23 29* 29* 24* 23  CREATININE 1.35* 1.11 1.00 0.87 0.85 0.68  CALCIUM 8.3* 8.0* 7.8* 8.0* 8.3* 8.2*  MG 1.5* 1.7 2.1 1.8 1.6* 1.8  PHOS 1.6*  --  2.9  --  3.0  --    GFR: Estimated Creatinine Clearance: 114.6 mL/min (by C-G formula based on SCr  of 0.68 mg/dL). Liver Function Tests: Recent Labs  Lab 07/18/21 0550 07/19/21 0431 07/20/21 0430 07/21/21 0401 07/22/21 0431  AST 97* 90* 57* 58* 53*  ALT 45* 48* 39 42 44  ALKPHOS 63 55 54 69 80  BILITOT 1.7* 1.6* 1.6* 1.4* 0.9  PROT 5.9* 5.6* 5.1* 5.3* 5.7*  ALBUMIN 3.0* 2.5* 2.3* 2.4* 2.6*   No results for input(s): LIPASE, AMYLASE in the last 168 hours. No results for input(s): AMMONIA in the last 168 hours. Coagulation Profile: No results for input(s): INR, PROTIME in the last 168 hours. Cardiac Enzymes: No results for input(s): CKTOTAL, CKMB, CKMBINDEX, TROPONINI in the last 168 hours. BNP (last 3 results) No results for input(s): PROBNP in the last 8760 hours. HbA1C: No results for input(s): HGBA1C in the last 72 hours. CBG: Recent Labs  Lab 07/21/21 0424 07/21/21 0730 07/21/21 1138 07/21/21 1536 07/22/21 1621  GLUCAP 82 86 84 84 142*   Lipid Profile: No results for input(s): CHOL, HDL, LDLCALC, TRIG, CHOLHDL, LDLDIRECT in the last 72 hours.  Thyroid Function Tests: No results for input(s): TSH, T4TOTAL, FREET4, T3FREE, THYROIDAB in the last 72 hours. Anemia Panel: No results for input(s):  VITAMINB12, FOLATE, FERRITIN, TIBC, IRON, RETICCTPCT in the last 72 hours.  Sepsis Labs: Recent Labs  Lab 07/18/21 0745 07/18/21 1047 07/18/21 1621 07/19/21 0431 07/20/21 0430 07/21/21 0401  PROCALCITON 55.58  --   --  39.86 25.00  --   LATICACIDVEN 2.7*   < > 3.4* 3.3* 2.3* 0.9   < > = values in this interval not displayed.    Recent Results (from the past 240 hour(s))  Urine Culture     Status: Abnormal   Collection Time: 07/17/21  3:46 PM   Specimen: Urine, Clean Catch  Result Value Ref Range Status   Specimen Description   Final    URINE, CLEAN CATCH Performed at [facility], [address] [place], [place] [address]    Special Requests   Final    NONE Performed at [facility], [address] [place], [place] [address]    Culture >=100,000 COLONIES/mL KLEBSIELLA PNEUMONIAE (A)  Final   Report Status 07/20/2021 FINAL  Final   Organism ID, Bacteria KLEBSIELLA PNEUMONIAE (A)  Final      Susceptibility   Klebsiella pneumoniae - MIC*    AMPICILLIN >=32 RESISTANT Resistant     CEFAZOLIN <=4 SENSITIVE Sensitive     CEFEPIME <=0.12 SENSITIVE Sensitive     CEFTRIAXONE <=0.25 SENSITIVE Sensitive     CIPROFLOXACIN <=0.25 SENSITIVE Sensitive     GENTAMICIN <=1 SENSITIVE Sensitive     IMIPENEM <=0.25 SENSITIVE Sensitive     NITROFURANTOIN 64 INTERMEDIATE Intermediate     TRIMETH/SULFA <=20 SENSITIVE Sensitive     AMPICILLIN/SULBACTAM 16 INTERMEDIATE Intermediate     PIP/TAZO 16 SENSITIVE Sensitive     * >=100,000 COLONIES/mL KLEBSIELLA PNEUMONIAE  Resp Panel by RT-PCR (Flu A&B, Covid) Nasopharyngeal Swab     Status: None   Collection Time: 07/17/21  4:37 PM   Specimen: Nasopharyngeal Swab; Nasopharyngeal(NP) swabs in vial transport medium  Result Value Ref Range Status   SARS Coronavirus 2 by RT PCR NEGATIVE NEGATIVE Final    Comment: (NOTE) SARS-CoV-2 target nucleic acids are NOT DETECTED.  The SARS-CoV-2 RNA is generally detectable in upper respiratory specimens  during the acute phase of infection. The lowest concentration of SARS-CoV-2 viral copies this assay can detect is 138 copies/mL. A negative result does not preclude SARS-Cov-2 infection and should not  be used as the sole basis for treatment or other patient management decisions. A negative result may occur with  improper specimen collection/handling, submission of specimen other than nasopharyngeal swab, presence of viral mutation(s) within the areas targeted by this assay, and inadequate number of viral copies(<138 copies/mL). A negative result must be combined with clinical observations, patient history, and epidemiological information. The expected result is Negative.  Fact Sheet for Patients:  BloggerCourse.com  Fact Sheet for Healthcare Providers:  SeriousBroker.it  This test is no t yet approved or cleared by the [place] FDA and  has been authorized for detection and/or diagnosis of SARS-CoV-2 by FDA under an Emergency Use Authorization (EUA). This EUA will remain  in effect (meaning this test can be used) for the duration of the COVID-19 declaration under Section 564(b)(1) of the Act, 21 U.S.C.section 360bbb-3(b)(1), unless the authorization is terminated  or revoked sooner.       Influenza A by PCR NEGATIVE NEGATIVE Final   Influenza B by PCR NEGATIVE NEGATIVE Final    Comment: (NOTE) The Xpert Xpress SARS-CoV-2/FLU/RSV plus assay is intended as an aid in the diagnosis of influenza from Nasopharyngeal swab specimens and should not be used as a sole basis for treatment. Nasal washings and aspirates are unacceptable for Xpert Xpress SARS-CoV-2/FLU/RSV testing.  Fact Sheet for Patients: BloggerCourse.com  Fact Sheet for Healthcare Providers: SeriousBroker.it  This test is not yet approved or cleared by the [place] FDA and has been authorized for detection  and/or diagnosis of SARS-CoV-2 by FDA under an Emergency Use Authorization (EUA). This EUA will remain in effect (meaning this test can be used) for the duration of the COVID-19 declaration under Section 564(b)(1) of the Act, 21 U.S.C. section 360bbb-3(b)(1), unless the authorization is terminated or revoked.  Performed at [facility], [address], [place], [place] [address]   Blood culture (routine x 2)     Status: None   Collection Time: 07/17/21  6:34 PM   Specimen: Right Antecubital; Blood  Result Value Ref Range Status   Specimen Description   Final    RIGHT ANTECUBITAL BOTTLES DRAWN AEROBIC AND ANAEROBIC   Special Requests Blood Culture adequate volume  Final   Culture   Final    NO GROWTH 5 DAYS Performed at [facility], [address], [place], [place] [address]    Report Status 07/22/2021 FINAL  Final  Blood culture (routine x 2)     Status: None   Collection Time: 07/17/21  6:41 PM   Specimen: Left Antecubital; Blood  Result Value Ref Range Status   Specimen Description   Final    LEFT ANTECUBITAL BOTTLES DRAWN AEROBIC AND ANAEROBIC   Special Requests   Final    Blood Culture results may not be optimal due to an excessive volume of blood received in culture bottles   Culture   Final    NO GROWTH 5 DAYS Performed at [facility], [address] [place], [place] [address]    Report Status 07/22/2021 FINAL  Final  MRSA Next Gen by PCR, Nasal     Status: None   Collection Time: 07/18/21  5:32 AM   Specimen: Nasal Mucosa; Nasal Swab  Result Value Ref Range Status   MRSA by PCR Next Gen NOT DETECTED NOT DETECTED Final    Comment: (NOTE) The GeneXpert MRSA Assay (FDA approved for NASAL specimens only), is one component of a comprehensive MRSA colonization surveillance program. It is not intended to diagnose MRSA infection nor  to guide or monitor treatment for MRSA infections. Test performance is not FDA approved in patients less than 51 years old. Performed at [facility], [address], [place], [place] [address]          Radiology Studies: No results found.      Scheduled Meds:  ARIPiprazole  5 mg Oral QHS   [START ON 07/24/2021] cefdinir  300 mg Oral Q12H   Chlorhexidine Gluconate Cloth  6 each Topical Daily   levothyroxine  137 mcg Oral Q0600   mouth rinse  15 mL Mouth Rinse BID   polyethylene glycol  17 g Oral Daily   potassium chloride  40 mEq Oral Once   sertraline  200 mg Oral Daily   tamsulosin  0.4 mg Oral Daily   Continuous Infusions:     LOS: 5 days    Time spent: 35 minutes    Erick Blinks, MD [facility]  If 7PM-7AM, please contact night-coverage [email or web address] 07/23/2021, 7:21 PM

## 2021-07-23 NOTE — Progress Notes (Signed)
Patient requires frequent re-positioning of the body in ways that cannot be achieved with an ordinary bed or wedge pillow to eliminate pain and the head of the bed needs to be evaluated more than 30 degrees most of the time.    Ellianah Cordy D, LCSW  

## 2021-07-23 NOTE — Progress Notes (Signed)
Patient has mobility limitations that impair their ability to do one or more mobility related activities of daily living in customary locations in the home. Patient cannot use crutches, cane or walker to resolve the issue sufficiently, patient can safely self-propel the wheelchair in the home or has a caregiver that can assist.  Fina Heizer D, LCSW  

## 2021-07-24 DIAGNOSIS — I10 Essential (primary) hypertension: Secondary | ICD-10-CM | POA: Diagnosis not present

## 2021-07-24 DIAGNOSIS — L8990 Pressure ulcer of unspecified site, unspecified stage: Secondary | ICD-10-CM | POA: Diagnosis present

## 2021-07-24 DIAGNOSIS — E039 Hypothyroidism, unspecified: Secondary | ICD-10-CM | POA: Diagnosis not present

## 2021-07-24 DIAGNOSIS — A419 Sepsis, unspecified organism: Secondary | ICD-10-CM | POA: Diagnosis not present

## 2021-07-24 DIAGNOSIS — N320 Bladder-neck obstruction: Secondary | ICD-10-CM | POA: Diagnosis not present

## 2021-07-24 LAB — BASIC METABOLIC PANEL WITH GFR
Anion gap: 6 (ref 5–15)
BUN: 19 mg/dL (ref 8–23)
CO2: 25 mmol/L (ref 22–32)
Calcium: 8.2 mg/dL — ABNORMAL LOW (ref 8.9–10.3)
Chloride: 106 mmol/L (ref 98–111)
Creatinine, Ser: 0.7 mg/dL (ref 0.61–1.24)
GFR, Estimated: >60 mL/min
Glucose, Bld: 94 mg/dL (ref 70–99)
Potassium: 3.5 mmol/L (ref 3.5–5.1)
Sodium: 137 mmol/L (ref 135–145)

## 2021-07-24 NOTE — Progress Notes (Signed)
Patient up to chair and sat for approximately 2 hours. Patient had BM in chair, cleaned and requested to go back to bed. Patient positioned in bed with no complaints. Daughter at bedside

## 2021-07-24 NOTE — Progress Notes (Signed)
Gave patient Facilities manager.  Explained usage to patient and how often.  Patient was able to only do X6.  Patient's mouth had some sores on it and patient stated it was a little painful.  Left at bedside.

## 2021-07-24 NOTE — Progress Notes (Signed)
PROGRESS NOTE    MACLANE HOLLORAN  AJO:[id number] DOB: August 22, 1945 DOA: 07/17/2021 PCP: [facility]   Brief Narrative:   Patrick Kerr is a 75 y.o. male with medical history significant for hypertension, BPH with history of TURP, hypothyroidism who presents to the emergency department accompanied by daughter due to weakness.  Patient was unable to provide history, history was obtained from ED physician and daughter at bedside.  Per report, patient recently had circumcision in October 2022 due to recent development of phimosis, patient did complain of burning penile sensation prior to and after circumcision.  He was prescribed a 7-day course of doxycycline followed with Macrobid for possible prostate infection through the [place] system and had urine culture demonstrating Klebsiella that was sensitive to Rocephin.  He continued to have ongoing weakness despite use of antibiotics.  He was admitted with septic shock present on admission secondary to Klebsiella UTI.  His sepsis physiology is now improving and he has been extubated on 12/20.  Assessment & Plan:   Principal Problem:   Sepsis secondary to UTI [facility]) Active Problems:   Macrocytic anemia   Hypokalemia   Thrombocytopenia (HCC)   Bladder outlet obstruction   Low TSH level   Lactic acidosis   Obesity (BMI 30.0-34.9)   Essential hypertension   BPH (benign prostatic hyperplasia)   Acquired hypothyroidism   Pressure injury of skin   Septic shock secondary to UTI-Klebsiella, present on admission -Briefly did require vasopressors, norepinephrine has been weaned off -Initially treated with IV ceftriaxone -He has not been transitioned to [employer] -Blood cultures with no growth to date -Lactic acid has normalized -Since blood pressure is stable, will hold off on further IV fluids since he is developing anasarca   Urinary retention related to above-acute -Status post coud catheter placement -Discussed with Dr. Ronne Binning, urology  who recommended to continue current catheter until he is able to follow-up with urology as an outpatient   Intractable nausea and vomiting with hypotension related to above factors -Patient intubated for airway protection -Patient extubated on 12/20 -Appreciate PCCM assistance  Acute metabolic encephalopathy -Suspect related to medications given while patient was mechanically ventilated, also likely has component related to infectious process -Appears to be slowly improving back to baseline  AKI -Mild with creatinine increasing to 1.3 -Now improved to 0.6 -Suspect related to hemodynamic instabilities/sepsis   Hypokalemia -Replete and continue monitoring   Prolonged QTC -Avoid QT prolonging medications and supplement electrolytes   Thrombocytopenia -Suspect related to infectious process -Levels appear to be improving -Continue monitoring   Essential hypertension -Using as needed hydralazine for elevated pressures -Blood pressures currently stable   Hypothyroidism -Continue Synthroid   BPH -On Flomax  Dysphagia -Seen by speech therapy and started on dysphagia 2 diet  Generalized weakness -PT/OT -Daughter is wanting to take him home with home health aides -Awaiting for delivery of home equipment including hospital bed, wheelchair and Hoyer lift since moving the patient home without this equipment will be difficult     DVT prophylaxis: SCDs Code Status: Full Family Communication: Spoke with daughter at bedside 12/24 Disposition Plan:  Status is: Inpatient   Remains inpatient appropriate because: Safe discharge plan Home   Consultants:  PCCM   Procedures:  12/18 intubation 12/20 extubation  Antimicrobials:  Anti-infectives (From admission, onward)    Start     Dose/Rate Route Frequency Ordered Stop   07/24/21 1000  cefdinir (OMNICEF) capsule 300 mg        300 mg Oral Every  12 hours 07/23/21 1019 07/28/21 0959   07/19/21 0700  cefTRIAXone (ROCEPHIN) 2 g in  sodium chloride 0.9 % 100 mL IVPB  Status:  Discontinued        2 g 200 mL/hr over 30 Minutes Intravenous Every 24 hours 07/18/21 1157 07/23/21 1019   07/18/21 0800  cefTRIAXone (ROCEPHIN) 2 g in sodium chloride 0.9 % 100 mL IVPB  Status:  Discontinued        2 g 200 mL/hr over 30 Minutes Intravenous Every 24 hours 07/18/21 0711 07/18/21 1106   07/17/21 1645  cefTRIAXone (ROCEPHIN) 1 g in sodium chloride 0.9 % 100 mL IVPB        1 g 200 mL/hr over 30 Minutes Intravenous  Once 07/17/21 1632 07/17/21 1835   07/17/21 0000  cephALEXin (KEFLEX) 500 MG capsule        500 mg Oral 4 times daily 07/17/21 1736         Subjective: He was sitting up in the chair earlier today.  Currently sitting up in bed.  Seems to be fatigued.  Overall reports that oral intake is improving.  Objective: Vitals:   07/23/21 2317 07/24/21 0311 07/24/21 0642 07/24/21 1343  BP: (!) 147/75 129/71 (!) 142/53 (!) 146/63  Pulse: 85 84 84 84  Resp: Temp: 98.2 F (36.8 C) (!) 97.1 F (36.2 C) (!) 97 F (36.1 C) 98.1 F (36.7 C)  TempSrc: Oral   Oral  SpO2: 95% 93% 93% 92%  Weight:      Height:        Intake/Output Summary (Last 24 hours) at 07/24/2021 1948 Last data filed at 07/24/2021 1744 Gross per 24 hour  Intake 700 ml  Output 500 ml  Net 200 ml   Filed Weights   07/20/21 0332 07/21/21 0429 07/22/21 0500  Weight: 130.8 kg 129.7 kg 127.5 kg    Examination:  General exam: Sitting up in bed, no distress Respiratory system: Clear to auscultation. Respiratory effort normal. Cardiovascular system:RRR. No murmurs, rubs, gallops. Gastrointestinal system: Abdomen is nondistended, soft and nontender. No organomegaly or masses felt. Normal bowel sounds heard. Central nervous system: No focal neurological deficits. Extremities: Anasarca is improving Skin: No rashes, lesions or ulcers Psychiatry: Awake, pleasant, appropriate     Data Reviewed: I have personally reviewed following labs and  imaging studies  CBC: Recent Labs  Lab 07/19/21 0431 07/20/21 0430 07/21/21 0401 07/22/21 0431 07/23/21 0618  WBC 19.3* 13.9* 11.2* 8.1 6.9  HGB 11.6* 10.2* 9.4* 10.4* 10.1*  HCT 35.4* 31.6* 29.6* 32.1* 30.9*  MCV 104.7* 106.8* 108.8* 108.4* 107.3*  PLT 95* 82* 68* 76* 100*   Basic Metabolic Panel: Recent Labs  Lab 07/18/21 0550 07/19/21 0431 07/20/21 0430 07/21/21 0401 07/22/21 0431 07/23/21 0618 07/24/21 0500  NA 137 134* 135 138 140 139 137  K 2.4* 3.3* 3.1* 3.6 3.5 3.4* 3.5  CL 107 104 106 107 107 105 106  CO2 18* 19* GLUCOSE 196* 126* 136* 85 84 95 94  BUN 17 23 29* 29* 24* 23 19  CREATININE 1.35* 1.11 1.00 0.87 0.85 0.68 0.70  CALCIUM 8.3* 8.0* 7.8* 8.0* 8.3* 8.2* 8.2*  MG 1.5* 1.7 2.1 1.8 1.6* 1.8  --   PHOS 1.6*  --  2.9  --  3.0  --   --    GFR: Estimated Creatinine Clearance: 114.6 mL/min (by C-G formula based on SCr of 0.7 mg/dL). Liver Function Tests: Recent  Labs  Lab 07/18/21 0550 07/19/21 0431 07/20/21 0430 07/21/21 0401 07/22/21 0431  AST 97* 90* 57* 58* 53*  ALT 45* 48* 39 42 44  ALKPHOS 63 55 54 69 80  BILITOT 1.7* 1.6* 1.6* 1.4* 0.9  PROT 5.9* 5.6* 5.1* 5.3* 5.7*  ALBUMIN 3.0* 2.5* 2.3* 2.4* 2.6*   No results for input(s): LIPASE, AMYLASE in the last 168 hours. No results for input(s): AMMONIA in the last 168 hours. Coagulation Profile: No results for input(s): INR, PROTIME in the last 168 hours. Cardiac Enzymes: No results for input(s): CKTOTAL, CKMB, CKMBINDEX, TROPONINI in the last 168 hours. BNP (last 3 results) No results for input(s): PROBNP in the last 8760 hours. HbA1C: No results for input(s): HGBA1C in the last 72 hours. CBG: Recent Labs  Lab 07/21/21 0424 07/21/21 0730 07/21/21 1138 07/21/21 1536 07/22/21 1621  GLUCAP 82 86 84 84 142*   Lipid Profile: No results for input(s): CHOL, HDL, LDLCALC, TRIG, CHOLHDL, LDLDIRECT in the last 72 hours.  Thyroid Function Tests: No results for input(s): TSH,  T4TOTAL, FREET4, T3FREE, THYROIDAB in the last 72 hours. Anemia Panel: No results for input(s): VITAMINB12, FOLATE, FERRITIN, TIBC, IRON, RETICCTPCT in the last 72 hours.  Sepsis Labs: Recent Labs  Lab 07/18/21 0745 07/18/21 1047 07/18/21 1621 07/19/21 0431 07/20/21 0430 07/21/21 0401  PROCALCITON 55.58  --   --  39.86 25.00  --   LATICACIDVEN 2.7*   < > 3.4* 3.3* 2.3* 0.9   < > = values in this interval not displayed.    Recent Results (from the past 240 hour(s))  Urine Culture     Status: Abnormal   Collection Time: 07/17/21  3:46 PM   Specimen: Urine, Clean Catch  Result Value Ref Range Status   Specimen Description   Final    URINE, CLEAN CATCH Performed at [facility], [address] [place], [place] [address]    Special Requests   Final    NONE Performed at [facility], [address], [place], [place] [address]    Culture >=100,000 COLONIES/mL KLEBSIELLA PNEUMONIAE (A)  Final   Report Status 07/20/2021 FINAL  Final   Organism ID, Bacteria KLEBSIELLA PNEUMONIAE (A)  Final      Susceptibility   Klebsiella pneumoniae - MIC*    AMPICILLIN >=32 RESISTANT Resistant     CEFAZOLIN <=4 SENSITIVE Sensitive     CEFEPIME <=0.12 SENSITIVE Sensitive     CEFTRIAXONE <=0.25 SENSITIVE Sensitive     CIPROFLOXACIN <=0.25 SENSITIVE Sensitive     GENTAMICIN <=1 SENSITIVE Sensitive     IMIPENEM <=0.25 SENSITIVE Sensitive     NITROFURANTOIN 64 INTERMEDIATE Intermediate     TRIMETH/SULFA <=20 SENSITIVE Sensitive     AMPICILLIN/SULBACTAM 16 INTERMEDIATE Intermediate     PIP/TAZO 16 SENSITIVE Sensitive     * >=100,000 COLONIES/mL KLEBSIELLA PNEUMONIAE  Resp Panel by RT-PCR (Flu A&B, Covid) Nasopharyngeal Swab     Status: None   Collection Time: 07/17/21  4:37 PM   Specimen: Nasopharyngeal Swab; Nasopharyngeal(NP) swabs in vial transport medium  Result Value Ref Range Status   SARS Coronavirus 2 by RT PCR NEGATIVE NEGATIVE Final    Comment: (NOTE) SARS-CoV-2 target nucleic acids  are NOT DETECTED.  The SARS-CoV-2 RNA is generally detectable in upper respiratory specimens during the acute phase of infection. The lowest concentration of SARS-CoV-2 viral copies this assay can detect is 138 copies/mL. A negative result does not preclude SARS-Cov-2 infection and should not be used as the sole basis for  treatment or other patient management decisions. A negative result may occur with  improper specimen collection/handling, submission of specimen other than nasopharyngeal swab, presence of viral mutation(s) within the areas targeted by this assay, and inadequate number of viral copies(<138 copies/mL). A negative result must be combined with clinical observations, patient history, and epidemiological information. The expected result is Negative.  Fact Sheet for Patients:  BloggerCourse.com  Fact Sheet for Healthcare Providers:  SeriousBroker.it  This test is no t yet approved or cleared by the [place] FDA and  has been authorized for detection and/or diagnosis of SARS-CoV-2 by FDA under an Emergency Use Authorization (EUA). This EUA will remain  in effect (meaning this test can be used) for the duration of the COVID-19 declaration under Section 564(b)(1) of the Act, 21 U.S.C.section 360bbb-3(b)(1), unless the authorization is terminated  or revoked sooner.       Influenza A by PCR NEGATIVE NEGATIVE Final   Influenza B by PCR NEGATIVE NEGATIVE Final    Comment: (NOTE) The Xpert Xpress SARS-CoV-2/FLU/RSV plus assay is intended as an aid in the diagnosis of influenza from Nasopharyngeal swab specimens and should not be used as a sole basis for treatment. Nasal washings and aspirates are unacceptable for Xpert Xpress SARS-CoV-2/FLU/RSV testing.  Fact Sheet for Patients: BloggerCourse.com  Fact Sheet for Healthcare Providers: SeriousBroker.it  This test is  not yet approved or cleared by the [place] FDA and has been authorized for detection and/or diagnosis of SARS-CoV-2 by FDA under an Emergency Use Authorization (EUA). This EUA will remain in effect (meaning this test can be used) for the duration of the COVID-19 declaration under Section 564(b)(1) of the Act, 21 U.S.C. section 360bbb-3(b)(1), unless the authorization is terminated or revoked.  Performed at [facility], [address], [place], [place] [address]   Blood culture (routine x 2)     Status: None   Collection Time: 07/17/21  6:34 PM   Specimen: Right Antecubital; Blood  Result Value Ref Range Status   Specimen Description   Final    RIGHT ANTECUBITAL BOTTLES DRAWN AEROBIC AND ANAEROBIC   Special Requests Blood Culture adequate volume  Final   Culture   Final    NO GROWTH 5 DAYS Performed at [facility], [address], [place], [place] [address]    Report Status 07/22/2021 FINAL  Final  Blood culture (routine x 2)     Status: None   Collection Time: 07/17/21  6:41 PM   Specimen: Left Antecubital; Blood  Result Value Ref Range Status   Specimen Description   Final    LEFT ANTECUBITAL BOTTLES DRAWN AEROBIC AND ANAEROBIC   Special Requests   Final    Blood Culture results may not be optimal due to an excessive volume of blood received in culture bottles   Culture   Final    NO GROWTH 5 DAYS Performed at [facility], [address], [place], [place] [address]    Report Status 07/22/2021 FINAL  Final  MRSA Next Gen by PCR, Nasal     Status: None   Collection Time: 07/18/21  5:32 AM   Specimen: Nasal Mucosa; Nasal Swab  Result Value Ref Range Status   MRSA by PCR Next Gen NOT DETECTED NOT DETECTED Final    Comment: (NOTE) The GeneXpert MRSA Assay (FDA approved for NASAL specimens only), is one component of a comprehensive MRSA colonization surveillance program. It is not intended to diagnose MRSA infection nor to guide or monitor treatment for MRSA  infections. Test performance is not FDA approved in patients less than 51 years old. Performed at [facility], [address], [place], [place] [address]          Radiology Studies: No results found.      Scheduled Meds:  ARIPiprazole  5 mg Oral QHS   cefdinir  300 mg Oral Q12H   Chlorhexidine Gluconate Cloth  6 each Topical Daily   levothyroxine  137 mcg Oral Q0600   mouth rinse  15 mL Mouth Rinse BID   polyethylene glycol  17 g Oral Daily   sertraline  200 mg Oral Daily   tamsulosin  0.4 mg Oral Daily   Continuous Infusions:     LOS: 6 days    Time spent: 35 minutes    Erick Blinks, MD [facility]  If 7PM-7AM, please contact night-coverage [email or web address] 07/24/2021, 7:48 PM

## 2021-07-24 NOTE — TOC Progression Note (Signed)
Transition of Care [facility]) - Progression Note    Patient Details  Name: Patrick Kerr MRN: [id number] Date of Birth: 07/14/1946  Transition of Care [facility] CM/SW Contact  Barry Brunner, LCSW Phone Number: 07/24/2021, 2:11 PM  Clinical Narrative:    CSW notified that patient's equipment will not be delivered until Tuesday. CSW also notified that patient is not currently qualifying for O2. Patient's daughter wishes to receive oxygen for her father even if patient doesn't qualify during his stay because she reports that patient's O2 has dropped during ambulation periodically at home. Patient's daughter agreeable to pay for O2 out of pocket if patient does not qualify during his stay. Morrie Sheldon with [employer] notified. TOC to follow.    Expected Discharge Plan: Home w Home Health Services Barriers to Discharge: Continued Medical Work up  Expected Discharge Plan and Services Expected Discharge Plan: Home w Home Health Services     Post Acute Care Choice: Durable Medical Equipment, Home Health Living arrangements for the past 2 months: Single Family Home                 DME Arranged: 3-N-1, Hospital bed, Wheelchair manual (Lift) DME Agency: [employer] Date DME Agency Contacted: 07/23/21 Time DME Agency Contacted: [phone] Representative spoke with at DME Agency: Morrie Sheldon HH Arranged: RN, PT, OT, Speech Therapy HH Agency: [facility] Date HH Agency Contacted: 07/23/21 Time HH Agency Contacted: [phone] Representative spoke with at [facility] Agency: Jerilynn Som   Social Determinants of Health (SDOH) Interventions    Readmission Risk Interventions No flowsheet data found.

## 2021-07-25 ENCOUNTER — Inpatient Hospital Stay (HOSPITAL_COMMUNITY): Payer: No Typology Code available for payment source

## 2021-07-25 DIAGNOSIS — N320 Bladder-neck obstruction: Secondary | ICD-10-CM | POA: Diagnosis not present

## 2021-07-25 DIAGNOSIS — A419 Sepsis, unspecified organism: Secondary | ICD-10-CM | POA: Diagnosis not present

## 2021-07-25 DIAGNOSIS — N3001 Acute cystitis with hematuria: Secondary | ICD-10-CM | POA: Diagnosis not present

## 2021-07-25 DIAGNOSIS — E039 Hypothyroidism, unspecified: Secondary | ICD-10-CM | POA: Diagnosis not present

## 2021-07-25 LAB — COMPREHENSIVE METABOLIC PANEL
ALT: 37 U/L (ref 0–44)
AST: 39 U/L (ref 15–41)
Albumin: 3 g/dL — ABNORMAL LOW (ref 3.5–5.0)
Alkaline Phosphatase: 77 U/L (ref 38–126)
Anion gap: 10 (ref 5–15)
BUN: 12 mg/dL (ref 8–23)
CO2: 23 mmol/L (ref 22–32)
Calcium: 8.4 mg/dL — ABNORMAL LOW (ref 8.9–10.3)
Chloride: 104 mmol/L (ref 98–111)
Creatinine, Ser: 0.83 mg/dL (ref 0.61–1.24)
GFR, Estimated: >60 mL/min (ref >60)
Glucose, Bld: 93 mg/dL (ref 70–99)
Potassium: 3.3 mmol/L — ABNORMAL LOW (ref 3.5–5.1)
Sodium: 137 mmol/L (ref 135–145)
Total Bilirubin: 0.8 mg/dL (ref 0.3–1.2)
Total Protein: 6.3 g/dL — ABNORMAL LOW (ref 6.5–8.1)

## 2021-07-25 LAB — CBC
HCT: 31.1 % — ABNORMAL LOW (ref 39.0–52.0)
Hemoglobin: 10.2 g/dL — ABNORMAL LOW (ref 13.0–17.0)
MCH: 34.9 pg — ABNORMAL HIGH (ref 26.0–34.0)
MCHC: 32.8 g/dL (ref 30.0–36.0)
MCV: 106.5 fL — ABNORMAL HIGH (ref 80.0–100.0)
Platelets: 147 10*3/uL — ABNORMAL LOW (ref 150–400)
RBC: 2.92 MIL/uL — ABNORMAL LOW (ref 4.22–5.81)
RDW: 12 % (ref 11.5–15.5)
WBC: 6.7 10*3/uL (ref 4.0–10.5)
nRBC: 0 % (ref 0.0–0.2)

## 2021-07-25 LAB — AMMONIA: Ammonia: 20 umol/L (ref 9–35)

## 2021-07-25 LAB — TROPONIN I (HIGH SENSITIVITY)
Troponin I (High Sensitivity): 69 ng/L — ABNORMAL HIGH (ref <18)
Troponin I (High Sensitivity): 63 ng/L — ABNORMAL HIGH (ref <18)

## 2021-07-25 MED ORDER — DIPHENHYDRAMINE HCL 50 MG/ML IJ SOLN
25.0000 mg | Freq: Once | INTRAMUSCULAR | Status: AC
  Administered 2021-07-25: 02:00:00 25 mg via INTRAVENOUS
  Filled 2021-07-25: qty 1

## 2021-07-25 MED ORDER — HALOPERIDOL LACTATE 5 MG/ML IJ SOLN
5.0000 mg | Freq: Once | INTRAMUSCULAR | Status: AC
  Administered 2021-07-25: 02:00:00 5 mg via INTRAVENOUS
  Filled 2021-07-25: qty 1

## 2021-07-25 MED ORDER — LORAZEPAM 2 MG/ML IJ SOLN
1.0000 mg | Freq: Three times a day (TID) | INTRAMUSCULAR | Status: DC
  Administered 2021-07-25 – 2021-07-26 (×3): 1 mg via INTRAVENOUS
  Filled 2021-07-25 (×3): qty 1

## 2021-07-25 MED ORDER — HYDROCODONE BIT-HOMATROP MBR 5-1.5 MG/5ML PO SOLN
5.0000 mL | Freq: Once | ORAL | Status: AC
  Administered 2021-07-25: 05:00:00 5 mL via ORAL
  Filled 2021-07-25: qty 5

## 2021-07-25 MED ORDER — POTASSIUM CHLORIDE 10 MEQ/100ML IV SOLN
10.0000 meq | INTRAVENOUS | Status: AC
  Administered 2021-07-25 (×4): 10 meq via INTRAVENOUS
  Filled 2021-07-25 (×3): qty 100

## 2021-07-25 MED ORDER — LACTATED RINGERS IV SOLN: INTRAVENOUS | Status: DC

## 2021-07-25 MED ORDER — LORAZEPAM 2 MG/ML IJ SOLN
2.0000 mg | INTRAMUSCULAR | Status: DC | PRN
  Administered 2021-07-25 – 2021-07-28 (×2): 2 mg via INTRAVENOUS
  Filled 2021-07-25 (×3): qty 1

## 2021-07-25 NOTE — Progress Notes (Signed)
Sent on call message of pt  recent Troponin I (High Sensitivity) Of 69. No new orders at this time will continue to monitor

## 2021-07-25 NOTE — Progress Notes (Signed)
PROGRESS NOTE    ABIMELEC GROCHOWSKI  ZOX:[id number] DOB: [birth year] DOA: 07/17/2021 PCP: [facility]   Brief Narrative:   SHARIEF WAINWRIGHT is a 75 y.o. male with medical history significant for hypertension, BPH with history of TURP, hypothyroidism who presents to the emergency department accompanied by daughter due to weakness.  Patient was unable to provide history, history was obtained from ED physician and daughter at bedside.  Per report, patient recently had circumcision in October 2022 due to recent development of phimosis, patient did complain of burning penile sensation prior to and after circumcision.  He was prescribed a 7-day course of doxycycline followed with Macrobid for possible prostate infection through the [place] system and had urine culture demonstrating Klebsiella that was sensitive to Rocephin.  He continued to have ongoing weakness despite use of antibiotics.  He was admitted with septic shock present on admission secondary to Klebsiella UTI.  His sepsis physiology is now improving and he has been extubated on 12/20.  Assessment & Plan:   Principal Problem:   Sepsis secondary to UTI [facility]) Active Problems:   Macrocytic anemia   Hypokalemia   Thrombocytopenia (HCC)   Bladder outlet obstruction   Low TSH level   Lactic acidosis   Obesity (BMI 30.0-34.9)   Essential hypertension   BPH (benign prostatic hyperplasia)   Acquired hypothyroidism   Pressure injury of skin   Septic shock secondary to UTI-Klebsiella, present on admission -Briefly did require vasopressors, norepinephrine has been weaned off -Initially treated with IV ceftriaxone -He has been transitioned to [employer] -Blood cultures with no growth to date -Lactic acid has normalized   Urinary retention related to above-acute -Status post coud catheter placement -Discussed with Dr. Ronne Binning, urology who recommended to continue current catheter until he is able to follow-up with urology as an  outpatient   Intractable nausea and vomiting with hypotension related to above factors -Patient intubated for airway protection -Patient extubated on 12/20 -Appreciate PCCM assistance  Acute metabolic encephalopathy -Initially felt to be related to medications given while patient was mechanically ventilated, also likely had component related to infectious process -mental status had improved to baseline -noted to have confusion and agitation on 12/24. He is also noted to be hyperreflexic and has increased tone. Since patient is on large dose of sertraline, concern for developing serotonin syndrome -will discontinue sertraline and abilify -treat agitation and tremors with lorazepam -labs otherwise unrevealing  AKI -Mild with creatinine increasing to 1.3 -Now resolved -Suspect related to hemodynamic instabilities/sepsis   Hypokalemia -Replete and continue monitoring   Prolonged QTC -Avoid QT prolonging medications and supplement electrolytes   Thrombocytopenia -Suspect related to infectious process -Levels appear to be improving -Continue monitoring   Essential hypertension -Using as needed hydralazine for elevated pressures -Blood pressures currently stable   Hypothyroidism -Continue Synthroid   BPH -On Flomax  Dysphagia -Seen by speech therapy and started on dysphagia 2 diet  Generalized weakness -PT/OT -Daughter is wanting to take him home with home health aides -Awaiting for delivery of home equipment including hospital bed, wheelchair and Hoyer lift since moving the patient home without this equipment will be difficult     DVT prophylaxis: SCDs Code Status: Full Family Communication: Spoke with daughter at bedside 12/25 Disposition Plan:  Status is: Inpatient   Remains inpatient appropriate because: Safe discharge plan Home, continue treatment of encephalopathy   Consultants:  PCCM   Procedures:  12/18 intubation 12/20 extubation  Antimicrobials:   Anti-infectives (From admission, onward)  Start     Dose/Rate Route Frequency Ordered Stop   07/24/21 1000  cefdinir (OMNICEF) capsule 300 mg        300 mg Oral Every 12 hours 07/23/21 1019 07/28/21 0959   07/19/21 0700  cefTRIAXone (ROCEPHIN) 2 g in sodium chloride 0.9 % 100 mL IVPB  Status:  Discontinued        2 g 200 mL/hr over 30 Minutes Intravenous Every 24 hours 07/18/21 1157 07/23/21 1019   07/18/21 0800  cefTRIAXone (ROCEPHIN) 2 g in sodium chloride 0.9 % 100 mL IVPB  Status:  Discontinued        2 g 200 mL/hr over 30 Minutes Intravenous Every 24 hours 07/18/21 0711 07/18/21 1106   07/17/21 1645  cefTRIAXone (ROCEPHIN) 1 g in sodium chloride 0.9 % 100 mL IVPB        1 g 200 mL/hr over 30 Minutes Intravenous  Once 07/17/21 1632 07/17/21 1835   07/17/21 0000  cephALEXin (KEFLEX) 500 MG capsule        500 mg Oral 4 times daily 07/17/21 1736         Subjective: Patient was increasingly agitated and confused overnight. Required multiple doses of ativan. Also received a dose of haldol.   Objective: Vitals:   07/24/21 2048 07/25/21 0539 07/25/21 1113 07/25/21 1220  BP: (!) 122/50 (!) 133/56  (!) 125/51  Pulse: 87 83  86  Resp: 19 19  20   Temp: 98.4 F (36.9 C) 97.7 F (36.5 C) 97.8 F (36.6 C) 98.7 F (37.1 C)  TempSrc: Oral Oral Oral   SpO2: 94% 96%  94%  Weight:      Height:        Intake/Output Summary (Last 24 hours) at 07/25/2021 1656 Last data filed at 07/25/2021 1300 Gross per 24 hour  Intake 360 ml  Output 1150 ml  Net -790 ml   Filed Weights   07/20/21 0332 07/21/21 0429 07/22/21 0500  Weight: 130.8 kg 129.7 kg 127.5 kg    Examination:  General exam: awake, sitting up in bed, confused Respiratory system: coarse breath sounds at baes. Respiratory effort normal. Cardiovascular system:RRR. No murmurs, rubs, gallops. Gastrointestinal system: Abdomen is nondistended, soft and nontender. No organomegaly or masses felt. Normal bowel sounds  heard. Central nervous system: confused. Does not follow commands. Increased tone in all 4 extremities. Hyperreflexic when checking bilateral patellar reflexes Extremities: No C/C/E, +pedal pulses Skin: No rashes, lesions or ulcers Psychiatry: confused      Data Reviewed: I have personally reviewed following labs and imaging studies  CBC: Recent Labs  Lab 07/20/21 0430 07/21/21 0401 07/22/21 0431 07/23/21 0618 07/25/21 0851  WBC 13.9* 11.2* 8.1 6.9 6.7  HGB 10.2* 9.4* 10.4* 10.1* 10.2*  HCT 31.6* 29.6* 32.1* 30.9* 31.1*  MCV 106.8* 108.8* 108.4* 107.3* 106.5*  PLT 82* 68* 76* 100* 147*   Basic Metabolic Panel: Recent Labs  Lab 07/19/21 0431 07/20/21 0430 07/21/21 0401 07/22/21 0431 07/23/21 0618 07/24/21 0500 07/25/21 0851  NA 134* 135 138 140 139 137 137  K 3.3* 3.1* 3.6 3.5 3.4* 3.5 3.3*  CL 104 106 107 107 105 106 104  CO2 19* 22 25 25 25 25 23   GLUCOSE 126* 136* 85 84 95 94 93  BUN 23 29* 29* 24* 23 19 12   CREATININE 1.11 1.00 0.87 0.85 0.68 0.70 0.83  CALCIUM 8.0* 7.8* 8.0* 8.3* 8.2* 8.2* 8.4*  MG 1.7 2.1 1.8 1.6* 1.8  --   --   PHOS  --  2.9  --  3.0  --   --   --    GFR: Estimated Creatinine Clearance: 110.4 mL/min (by C-G formula based on SCr of 0.83 mg/dL). Liver Function Tests: Recent Labs  Lab 07/19/21 0431 07/20/21 0430 07/21/21 0401 07/22/21 0431 07/25/21 0851  AST 90* 57* 58* 53* 39  ALT 48* 39 42 44 37  ALKPHOS 55 54 69 80 77  BILITOT 1.6* 1.6* 1.4* 0.9 0.8  PROT 5.6* 5.1* 5.3* 5.7* 6.3*  ALBUMIN 2.5* 2.3* 2.4* 2.6* 3.0*   No results for input(s): LIPASE, AMYLASE in the last 168 hours. Recent Labs  Lab 07/25/21 0851  AMMONIA 20   Coagulation Profile: No results for input(s): INR, PROTIME in the last 168 hours. Cardiac Enzymes: No results for input(s): CKTOTAL, CKMB, CKMBINDEX, TROPONINI in the last 168 hours. BNP (last 3 results) No results for input(s): PROBNP in the last 8760 hours. HbA1C: No results for input(s): HGBA1C in  the last 72 hours. CBG: Recent Labs  Lab 07/21/21 0424 07/21/21 0730 07/21/21 1138 07/21/21 1536 07/22/21 1621  GLUCAP 82 86 84 84 142*   Lipid Profile: No results for input(s): CHOL, HDL, LDLCALC, TRIG, CHOLHDL, LDLDIRECT in the last 72 hours.  Thyroid Function Tests: No results for input(s): TSH, T4TOTAL, FREET4, T3FREE, THYROIDAB in the last 72 hours. Anemia Panel: No results for input(s): VITAMINB12, FOLATE, FERRITIN, TIBC, IRON, RETICCTPCT in the last 72 hours.  Sepsis Labs: Recent Labs  Lab 07/19/21 0431 07/20/21 0430 07/21/21 0401  PROCALCITON 39.86 25.00  --   LATICACIDVEN 3.3* 2.3* 0.9    Recent Results (from the past 240 hour(s))  Urine Culture     Status: Abnormal   Collection Time: 07/17/21  3:46 PM   Specimen: Urine, Clean Catch  Result Value Ref Range Status   Specimen Description   Final    URINE, CLEAN CATCH Performed at [facility], [address], [place], [place] [address]    Special Requests   Final    NONE Performed at [facility] [place], [address] [place], [place] [address]    Culture >=100,000 COLONIES/mL KLEBSIELLA PNEUMONIAE (A)  Final   Report Status 07/20/2021 FINAL  Final   Organism ID, Bacteria KLEBSIELLA PNEUMONIAE (A)  Final      Susceptibility   Klebsiella pneumoniae - MIC*    AMPICILLIN >=32 RESISTANT Resistant     CEFAZOLIN <=4 SENSITIVE Sensitive     CEFEPIME <=0.12 SENSITIVE Sensitive     CEFTRIAXONE <=0.25 SENSITIVE Sensitive     CIPROFLOXACIN <=0.25 SENSITIVE Sensitive     GENTAMICIN <=1 SENSITIVE Sensitive     IMIPENEM <=0.25 SENSITIVE Sensitive     NITROFURANTOIN 64 INTERMEDIATE Intermediate     TRIMETH/SULFA <=20 SENSITIVE Sensitive     AMPICILLIN/SULBACTAM 16 INTERMEDIATE Intermediate     PIP/TAZO 16 SENSITIVE Sensitive     * >=100,000 COLONIES/mL KLEBSIELLA PNEUMONIAE  Resp Panel by RT-PCR (Flu A&B, Covid) Nasopharyngeal Swab     Status: None   Collection Time: 07/17/21  4:37 PM   Specimen: Nasopharyngeal Swab;  Nasopharyngeal(NP) swabs in vial transport medium  Result Value Ref Range Status   SARS Coronavirus 2 by RT PCR NEGATIVE NEGATIVE Final    Comment: (NOTE) SARS-CoV-2 target nucleic acids are NOT DETECTED.  The SARS-CoV-2 RNA is generally detectable in upper respiratory specimens during the acute phase of infection. The lowest concentration of SARS-CoV-2 viral copies this assay can detect is 138 copies/mL. A negative result does not preclude SARS-Cov-2 infection and should not be  used as the sole basis for treatment or other patient management decisions. A negative result may occur with  improper specimen collection/handling, submission of specimen other than nasopharyngeal swab, presence of viral mutation(s) within the areas targeted by this assay, and inadequate number of viral copies(<138 copies/mL). A negative result must be combined with clinical observations, patient history, and epidemiological information. The expected result is Negative.  Fact Sheet for Patients:  BloggerCourse.com  Fact Sheet for Healthcare Providers:  SeriousBroker.it  This test is no t yet approved or cleared by the [place] FDA and  has been authorized for detection and/or diagnosis of SARS-CoV-2 by FDA under an Emergency Use Authorization (EUA). This EUA will remain  in effect (meaning this test can be used) for the duration of the COVID-19 declaration under Section 564(b)(1) of the Act, 21 U.S.C.section 360bbb-3(b)(1), unless the authorization is terminated  or revoked sooner.       Influenza A by PCR NEGATIVE NEGATIVE Final   Influenza B by PCR NEGATIVE NEGATIVE Final    Comment: (NOTE) The Xpert Xpress SARS-CoV-2/FLU/RSV plus assay is intended as an aid in the diagnosis of influenza from Nasopharyngeal swab specimens and should not be used as a sole basis for treatment. Nasal washings and aspirates are unacceptable for Xpert Xpress  SARS-CoV-2/FLU/RSV testing.  Fact Sheet for Patients: BloggerCourse.com  Fact Sheet for Healthcare Providers: SeriousBroker.it  This test is not yet approved or cleared by the [place] FDA and has been authorized for detection and/or diagnosis of SARS-CoV-2 by FDA under an Emergency Use Authorization (EUA). This EUA will remain in effect (meaning this test can be used) for the duration of the COVID-19 declaration under Section 564(b)(1) of the Act, 21 U.S.C. section 360bbb-3(b)(1), unless the authorization is terminated or revoked.  Performed at [facility], [address] [place], [place] [address]   Blood culture (routine x 2)     Status: None   Collection Time: 07/17/21  6:34 PM   Specimen: Right Antecubital; Blood  Result Value Ref Range Status   Specimen Description   Final    RIGHT ANTECUBITAL BOTTLES DRAWN AEROBIC AND ANAEROBIC   Special Requests Blood Culture adequate volume  Final   Culture   Final    NO GROWTH 5 DAYS Performed at [facility], [address] [place], [place] [address]    Report Status 07/22/2021 FINAL  Final  Blood culture (routine x 2)     Status: None   Collection Time: 07/17/21  6:41 PM   Specimen: Left Antecubital; Blood  Result Value Ref Range Status   Specimen Description   Final    LEFT ANTECUBITAL BOTTLES DRAWN AEROBIC AND ANAEROBIC   Special Requests   Final    Blood Culture results may not be optimal due to an excessive volume of blood received in culture bottles   Culture   Final    NO GROWTH 5 DAYS Performed at [facility], [address], [place], [place] [address]    Report Status 07/22/2021 FINAL  Final  MRSA Next Gen by PCR, Nasal     Status: None   Collection Time: 07/18/21  5:32 AM   Specimen: Nasal Mucosa; Nasal Swab  Result Value Ref Range Status   MRSA by PCR Next Gen NOT DETECTED NOT DETECTED Final    Comment: (NOTE) The GeneXpert MRSA Assay (FDA approved for NASAL  specimens only), is one component of a comprehensive MRSA colonization surveillance program. It is not intended to diagnose MRSA infection nor  to guide or monitor treatment for MRSA infections. Test performance is not FDA approved in patients less than 61 years old. Performed at [facility], [address] [place], [place] [address]          Radiology Studies: DG CHEST PORT 1 VIEW  Result Date: 07/25/2021 CLINICAL DATA:  Cough. EXAM: PORTABLE CHEST 1 VIEW COMPARISON:  07/20/2021 FINDINGS: 1247 hours. Interval extubation and NG tube removal. Right IJ central line seen previously has been removed in the interval. Low lung volumes with basilar atelectasis/infiltrate, left greater than right. Interstitial markings are diffusely coarsened with chronic features. Cardiopericardial silhouette is at upper limits of normal for size. Telemetry leads overlie the chest. IMPRESSION: 1. Interval extubation and NG tube removal. 2. Low lung volumes with basilar atelectasis/infiltrate, left greater than right. Electronically Signed   By: Kennith Center M.D.   On: 07/25/2021 13:03        Scheduled Meds:  cefdinir  300 mg Oral Q12H   Chlorhexidine Gluconate Cloth  6 each Topical Daily   levothyroxine  137 mcg Oral Q0600   LORazepam  1 mg Intravenous Q8H   mouth rinse  15 mL Mouth Rinse BID   polyethylene glycol  17 g Oral Daily   tamsulosin  0.4 mg Oral Daily   Continuous Infusions:  lactated ringers 50 mL/hr at 07/25/21 1206   potassium chloride        LOS: 7 days    Time spent: 35 minutes    Erick Blinks, MD [facility]  If 7PM-7AM, please contact night-coverage [email or web address] 07/25/2021, 4:56 PM

## 2021-07-25 NOTE — Progress Notes (Signed)
Patient very agitated wanted to go home, this RN tried to redirect him multiple times but was unsuccessful, PRN ativan give and was also not effective. On-call notified new order given. We continue to monitor.

## 2021-07-25 NOTE — Progress Notes (Signed)
Patient was very confused last night, he kept talking about going home. This RN kept redirecting patient. We continue to monitor.

## 2021-07-25 NOTE — Progress Notes (Signed)
Pt c/o mid chest pain that radiates to right arm, vital signs taken and EKG obtained. Results given to MD, new orders are to obtain an troponin level. EKG in physical chart MD aware.

## 2021-07-25 NOTE — Plan of Care (Signed)
°  Problem: Education: Goal: Knowledge of General Education information will improve Description: Including pain rating scale, medication(s)/side effects and non-pharmacologic comfort measures Outcome: Not Progressing   Problem: Health Behavior/Discharge Planning: Goal: Ability to manage health-related needs will improve Outcome: Not Progressing   Problem: Clinical Measurements: Goal: Ability to maintain clinical measurements within normal limits will improve Outcome: Not Progressing Goal: Will remain free from infection Outcome: Not Progressing Goal: Diagnostic test results will improve Outcome: Progressing Goal: Respiratory complications will improve Outcome: Progressing Goal: Cardiovascular complication will be avoided Outcome: Progressing   Problem: Activity: Goal: Risk for activity intolerance will decrease Outcome: Not Progressing   Problem: Nutrition: Goal: Adequate nutrition will be maintained Outcome: Progressing   Problem: Coping: Goal: Level of anxiety will decrease Outcome: Not Progressing   Problem: Elimination: Goal: Will not experience complications related to bowel motility Outcome: Progressing Goal: Will not experience complications related to urinary retention Outcome: Progressing   Problem: Pain Managment: Goal: General experience of comfort will improve Outcome: Not Progressing   Problem: Safety: Goal: Ability to remain free from injury will improve Outcome: Progressing   Problem: Skin Integrity: Goal: Risk for impaired skin integrity will decrease Outcome: Not Progressing   Problem: Safety: Goal: Non-violent Restraint(s) Outcome: Not Progressing

## 2021-07-26 DIAGNOSIS — N3001 Acute cystitis with hematuria: Secondary | ICD-10-CM | POA: Diagnosis not present

## 2021-07-26 DIAGNOSIS — A419 Sepsis, unspecified organism: Secondary | ICD-10-CM | POA: Diagnosis not present

## 2021-07-26 DIAGNOSIS — N320 Bladder-neck obstruction: Secondary | ICD-10-CM | POA: Diagnosis not present

## 2021-07-26 DIAGNOSIS — E039 Hypothyroidism, unspecified: Secondary | ICD-10-CM | POA: Diagnosis not present

## 2021-07-26 LAB — CBC
HCT: 32 % — ABNORMAL LOW (ref 39.0–52.0)
Hemoglobin: 10.1 g/dL — ABNORMAL LOW (ref 13.0–17.0)
MCH: 33.6 pg (ref 26.0–34.0)
MCHC: 31.6 g/dL (ref 30.0–36.0)
MCV: 106.3 fL — ABNORMAL HIGH (ref 80.0–100.0)
Platelets: 165 10*3/uL (ref 150–400)
RBC: 3.01 MIL/uL — ABNORMAL LOW (ref 4.22–5.81)
RDW: 12.1 % (ref 11.5–15.5)
WBC: 6.3 10*3/uL (ref 4.0–10.5)
nRBC: 0 % (ref 0.0–0.2)

## 2021-07-26 LAB — BASIC METABOLIC PANEL
Anion gap: 12 (ref 5–15)
BUN: 9 mg/dL (ref 8–23)
CO2: 21 mmol/L — ABNORMAL LOW (ref 22–32)
Calcium: 8.5 mg/dL — ABNORMAL LOW (ref 8.9–10.3)
Chloride: 104 mmol/L (ref 98–111)
Creatinine, Ser: 0.69 mg/dL (ref 0.61–1.24)
GFR, Estimated: >60 mL/min (ref >60)
Glucose, Bld: 87 mg/dL (ref 70–99)
Potassium: 3.4 mmol/L — ABNORMAL LOW (ref 3.5–5.1)
Sodium: 137 mmol/L (ref 135–145)

## 2021-07-26 LAB — PHOSPHORUS: Phosphorus: 3.8 mg/dL (ref 2.5–4.6)

## 2021-07-26 LAB — CK: Total CK: 39 U/L — ABNORMAL LOW (ref 49–397)

## 2021-07-26 LAB — MAGNESIUM: Magnesium: 1.9 mg/dL (ref 1.7–2.4)

## 2021-07-26 MED ORDER — BLISTEX MEDICATED EX OINT
TOPICAL_OINTMENT | CUTANEOUS | Status: DC | PRN
  Filled 2021-07-26: qty 6.3

## 2021-07-26 MED ORDER — LIDOCAINE VISCOUS HCL 2 % MT SOLN
15.0000 mL | Freq: Three times a day (TID) | OROMUCOSAL | Status: DC
  Administered 2021-07-26 – 2021-07-27 (×4): 15 mL via OROMUCOSAL
  Filled 2021-07-26 (×4): qty 15

## 2021-07-26 MED ORDER — POTASSIUM CHLORIDE 10 MEQ/100ML IV SOLN
10.0000 meq | INTRAVENOUS | Status: AC
  Administered 2021-07-26 (×4): 10 meq via INTRAVENOUS
  Filled 2021-07-26 (×4): qty 100

## 2021-07-26 MED ORDER — LORAZEPAM 1 MG PO TABS
1.0000 mg | ORAL_TABLET | Freq: Every day | ORAL | Status: DC
  Administered 2021-07-26 – 2021-07-28 (×3): 1 mg via ORAL
  Filled 2021-07-26 (×3): qty 1

## 2021-07-26 MED ORDER — LORAZEPAM 0.5 MG PO TABS
0.5000 mg | ORAL_TABLET | Freq: Every day | ORAL | Status: DC
  Administered 2021-07-27 – 2021-07-29 (×3): 0.5 mg via ORAL
  Filled 2021-07-26 (×3): qty 1

## 2021-07-26 NOTE — Progress Notes (Signed)
PROGRESS NOTE    Patrick Kerr  ZOX:[id number] DOB: 04-10-1946 DOA: 07/17/2021 PCP: [facility]   Brief Narrative:   Patrick Kerr is a 75 y.o. male with medical history significant for hypertension, BPH with history of TURP, hypothyroidism who presents to the emergency department accompanied by daughter due to weakness.  Patient was unable to provide history, history was obtained from ED physician and daughter at bedside.  Per report, patient recently had circumcision in October 2022 due to recent development of phimosis, patient did complain of burning penile sensation prior to and after circumcision.  He was prescribed a 7-day course of doxycycline followed with Macrobid for possible prostate infection through the [place] system and had urine culture demonstrating Klebsiella that was sensitive to Rocephin.  He continued to have ongoing weakness despite use of antibiotics.  He was admitted with septic shock present on admission secondary to Klebsiella UTI.  His sepsis physiology is now improving and he has been extubated on 12/20.  Assessment & Plan:   Principal Problem:   Sepsis secondary to UTI [facility]) Active Problems:   Macrocytic anemia   Hypokalemia   Thrombocytopenia (HCC)   Bladder outlet obstruction   Low TSH level   Lactic acidosis   Obesity (BMI 30.0-34.9)   Essential hypertension   BPH (benign prostatic hyperplasia)   Acquired hypothyroidism   Pressure injury of skin   Septic shock secondary to UTI-Klebsiella, present on admission -Briefly did require vasopressors, norepinephrine has been weaned off -Initially treated with IV ceftriaxone -He has been transitioned to [employer] -Blood cultures with no growth to date -Lactic acid has normalized   Urinary retention related to above-acute -Status post coud catheter placement -Discussed with Dr. Ronne Binning, urology who recommended to continue current catheter until he is able to follow-up with urology as an  outpatient   Intractable nausea and vomiting with hypotension related to above factors -Patient intubated for airway protection -Patient extubated on 12/20 -Appreciate PCCM assistance  Acute metabolic encephalopathy -Initially felt to be related to medications given while patient was mechanically ventilated, also likely had component related to infectious process -mental status had improved to baseline -noted to have confusion and agitation on 12/24. He is also noted to be hyperreflexic and has increased tone. Since patient is on large dose of sertraline, concern for developing serotonin syndrome -will discontinue sertraline and abilify -treat agitation and tremors with lorazepam -labs otherwise unrevealing -Overall agitation appears to be improving, hypertonicity and hyperreflexia also appear to be better.  AKI -Mild with creatinine increasing to 1.3 -Now resolved -Suspect related to hemodynamic instabilities/sepsis   Hypokalemia -Replete and continue monitoring   Prolonged QTC -Avoid QT prolonging medications and supplement electrolytes   Thrombocytopenia -Suspect related to infectious process -Levels appear to be improving -Continue monitoring   Essential hypertension -Using as needed hydralazine for elevated pressures -Blood pressures currently stable   Hypothyroidism -Continue Synthroid   BPH -On Flomax  Dysphagia -Seen by speech therapy and started on dysphagia 2 diet  Generalized weakness -PT/OT -Daughter is wanting to take him home with home health aides -Awaiting for delivery of home equipment including hospital bed, wheelchair and Hoyer lift since moving the patient home without this equipment will be difficult     DVT prophylaxis: SCDs Code Status: Full Family Communication: Spoke with son-in-law at bedside 12/26 Disposition Plan:  Status is: Inpatient   Remains inpatient appropriate because: Safe discharge plan Home, continue treatment of  encephalopathy   Consultants:  PCCM   Procedures:  12/18 intubation 12/20 extubation  Antimicrobials:  Anti-infectives (From admission, onward)    Start     Dose/Rate Route Frequency Ordered Stop   07/24/21 1000  cefdinir (OMNICEF) capsule 300 mg        300 mg Oral Every 12 hours 07/23/21 1019 07/28/21 0959   07/19/21 0700  cefTRIAXone (ROCEPHIN) 2 g in sodium chloride 0.9 % 100 mL IVPB  Status:  Discontinued        2 g 200 mL/hr over 30 Minutes Intravenous Every 24 hours 07/18/21 1157 07/23/21 1019   07/18/21 0800  cefTRIAXone (ROCEPHIN) 2 g in sodium chloride 0.9 % 100 mL IVPB  Status:  Discontinued        2 g 200 mL/hr over 30 Minutes Intravenous Every 24 hours 07/18/21 0711 07/18/21 1106   07/17/21 1645  cefTRIAXone (ROCEPHIN) 1 g in sodium chloride 0.9 % 100 mL IVPB        1 g 200 mL/hr over 30 Minutes Intravenous  Once 07/17/21 1632 07/17/21 1835   07/17/21 0000  cephALEXin (KEFLEX) 500 MG capsule        500 mg Oral 4 times daily 07/17/21 1736         Subjective: Family reports that he slept well overnight.  He is still somnolent this morning, but does wake up to voice.  P.o. intake has been poor due to somnolence.  Objective: Vitals:   07/25/21 2132 07/26/21 0556 07/26/21 1430 07/26/21 2033  BP: (!) 141/56 (!) 147/78 138/71 (!) 144/70  Pulse: 82 83 80 84  Resp: Temp: 97.6 F (36.4 C) 98.6 F (37 C) 98 F (36.7 C) 98 F (36.7 C)  TempSrc: Oral  Oral Oral  SpO2: 97% 94% 96% 94%  Weight:      Height:        Intake/Output Summary (Last 24 hours) at 07/26/2021 2103 Last data filed at 07/26/2021 0800 Gross per 24 hour  Intake 341.73 ml  Output --  Net 341.73 ml   Filed Weights   07/20/21 0332 07/21/21 0429 07/22/21 0500  Weight: 130.8 kg 129.7 kg 127.5 kg    Examination:  General exam: Somnolent, wakes up to voice HEENT: Ulceration over upper and lower lips, with dried blood.  Appears to have ulcerations in oral mucosa, need to have  thorough mouth care completed before reassessment. Respiratory system: Clear to auscultation. Respiratory effort normal. Cardiovascular system:RRR. No murmurs, rubs, gallops. Gastrointestinal system: Abdomen is nondistended, soft and nontender. No organomegaly or masses felt. Normal bowel sounds heard. Central nervous system: No focal neurological deficits.  Overall tone in extremities appears to be better today. Extremities: No C/C/E, +pedal pulses Skin: No rashes, lesions or ulcers Psychiatry: Appears to be less agitated today.  He is somnolent, wakes up to voice       Data Reviewed: I have personally reviewed following labs and imaging studies  CBC: Recent Labs  Lab 07/21/21 0401 07/22/21 0431 07/23/21 0618 07/25/21 0851 07/26/21 0428  WBC 11.2* 8.1 6.9 6.7 6.3  HGB 9.4* 10.4* 10.1* 10.2* 10.1*  HCT 29.6* 32.1* 30.9* 31.1* 32.0*  MCV 108.8* 108.4* 107.3* 106.5* 106.3*  PLT 68* 76* 100* 147* 165   Basic Metabolic Panel: Recent Labs  Lab 07/20/21 0430 07/21/21 0401 07/22/21 0431 07/23/21 0618 07/24/21 0500 07/25/21 0851 07/26/21 0428  NA 135 138 140 139 137 137 137  K 3.1* 3.6 3.5 3.4* 3.5 3.3* 3.4*  CL 106 107 107 105 106 104 104  CO2  22 25 25 25 25 23  21*  GLUCOSE 136* 85 84 95 94 93 87  BUN 29* 29* 24* 23 19 12 9   CREATININE 1.00 0.87 0.85 0.68 0.70 0.83 0.69  CALCIUM 7.8* 8.0* 8.3* 8.2* 8.2* 8.4* 8.5*  MG 2.1 1.8 1.6* 1.8  --   --  1.9  PHOS 2.9  --  3.0  --   --   --  3.8   GFR: Estimated Creatinine Clearance: 114.6 mL/min (by C-G formula based on SCr of 0.69 mg/dL). Liver Function Tests: Recent Labs  Lab 07/20/21 0430 07/21/21 0401 07/22/21 0431 07/25/21 0851  AST 57* 58* 53* 39  ALT 39 42 44 37  ALKPHOS 54 69 80 77  BILITOT 1.6* 1.4* 0.9 0.8  PROT 5.1* 5.3* 5.7* 6.3*  ALBUMIN 2.3* 2.4* 2.6* 3.0*   No results for input(s): LIPASE, AMYLASE in the last 168 hours. Recent Labs  Lab 07/25/21 0851  AMMONIA 20   Coagulation Profile: No results  for input(s): INR, PROTIME in the last 168 hours. Cardiac Enzymes: Recent Labs  Lab 07/26/21 0428  CKTOTAL 39*   BNP (last 3 results) No results for input(s): PROBNP in the last 8760 hours. HbA1C: No results for input(s): HGBA1C in the last 72 hours. CBG: Recent Labs  Lab 07/21/21 0424 07/21/21 0730 07/21/21 1138 07/21/21 1536 07/22/21 1621  GLUCAP 82 86 84 84 142*   Lipid Profile: No results for input(s): CHOL, HDL, LDLCALC, TRIG, CHOLHDL, LDLDIRECT in the last 72 hours.  Thyroid Function Tests: No results for input(s): TSH, T4TOTAL, FREET4, T3FREE, THYROIDAB in the last 72 hours. Anemia Panel: No results for input(s): VITAMINB12, FOLATE, FERRITIN, TIBC, IRON, RETICCTPCT in the last 72 hours.  Sepsis Labs: Recent Labs  Lab 07/20/21 0430 07/21/21 0401  PROCALCITON 25.00  --   LATICACIDVEN 2.3* 0.9    Recent Results (from the past 240 hour(s))  Urine Culture     Status: Abnormal   Collection Time: 07/17/21  3:46 PM   Specimen: Urine, Clean Catch  Result Value Ref Range Status   Specimen Description   Final    URINE, CLEAN CATCH Performed at [facility], [address], [place], [address] [place]    Special Requests   Final    NONE Performed at [facility], [address], [place], [address] [place]    Culture >=100,000 COLONIES/mL KLEBSIELLA PNEUMONIAE (A)  Final   Report Status 07/20/2021 FINAL  Final   Organism ID, Bacteria KLEBSIELLA PNEUMONIAE (A)  Final      Susceptibility   Klebsiella pneumoniae - MIC*    AMPICILLIN >=32 RESISTANT Resistant     CEFAZOLIN <=4 SENSITIVE Sensitive     CEFEPIME <=0.12 SENSITIVE Sensitive     CEFTRIAXONE <=0.25 SENSITIVE Sensitive     CIPROFLOXACIN <=0.25 SENSITIVE Sensitive     GENTAMICIN <=1 SENSITIVE Sensitive     IMIPENEM <=0.25 SENSITIVE Sensitive     NITROFURANTOIN 64 INTERMEDIATE Intermediate     TRIMETH/SULFA <=20 SENSITIVE Sensitive     AMPICILLIN/SULBACTAM 16 INTERMEDIATE Intermediate     PIP/TAZO 16  SENSITIVE Sensitive     * >=100,000 COLONIES/mL KLEBSIELLA PNEUMONIAE  Resp Panel by RT-PCR (Flu A&B, Covid) Nasopharyngeal Swab     Status: None   Collection Time: 07/17/21  4:37 PM   Specimen: Nasopharyngeal Swab; Nasopharyngeal(NP) swabs in vial transport medium  Result Value Ref Range Status   SARS Coronavirus 2 by RT PCR NEGATIVE NEGATIVE Final    Comment: (NOTE) SARS-CoV-2 target nucleic acids are  NOT DETECTED.  The SARS-CoV-2 RNA is generally detectable in upper respiratory specimens during the acute phase of infection. The lowest concentration of SARS-CoV-2 viral copies this assay can detect is 138 copies/mL. A negative result does not preclude SARS-Cov-2 infection and should not be used as the sole basis for treatment or other patient management decisions. A negative result may occur with  improper specimen collection/handling, submission of specimen other than nasopharyngeal swab, presence of viral mutation(s) within the areas targeted by this assay, and inadequate number of viral copies(<138 copies/mL). A negative result must be combined with clinical observations, patient history, and epidemiological information. The expected result is Negative.  Fact Sheet for Patients:  BloggerCourse.com  Fact Sheet for Healthcare Providers:  SeriousBroker.it  This test is no t yet approved or cleared by the [place] FDA and  has been authorized for detection and/or diagnosis of SARS-CoV-2 by FDA under an Emergency Use Authorization (EUA). This EUA will remain  in effect (meaning this test can be used) for the duration of the COVID-19 declaration under Section 564(b)(1) of the Act, 21 U.S.C.section 360bbb-3(b)(1), unless the authorization is terminated  or revoked sooner.       Influenza A by PCR NEGATIVE NEGATIVE Final   Influenza B by PCR NEGATIVE NEGATIVE Final    Comment: (NOTE) The Xpert Xpress SARS-CoV-2/FLU/RSV plus  assay is intended as an aid in the diagnosis of influenza from Nasopharyngeal swab specimens and should not be used as a sole basis for treatment. Nasal washings and aspirates are unacceptable for Xpert Xpress SARS-CoV-2/FLU/RSV testing.  Fact Sheet for Patients: BloggerCourse.com  Fact Sheet for Healthcare Providers: SeriousBroker.it  This test is not yet approved or cleared by the [place] FDA and has been authorized for detection and/or diagnosis of SARS-CoV-2 by FDA under an Emergency Use Authorization (EUA). This EUA will remain in effect (meaning this test can be used) for the duration of the COVID-19 declaration under Section 564(b)(1) of the Act, 21 U.S.C. section 360bbb-3(b)(1), unless the authorization is terminated or revoked.  Performed at [facility], [address] [place], [place] [address]   Blood culture (routine x 2)     Status: None   Collection Time: 07/17/21  6:34 PM   Specimen: Right Antecubital; Blood  Result Value Ref Range Status   Specimen Description   Final    RIGHT ANTECUBITAL BOTTLES DRAWN AEROBIC AND ANAEROBIC   Special Requests Blood Culture adequate volume  Final   Culture   Final    NO GROWTH 5 DAYS Performed at [facility], [address], [place], [place] [address]    Report Status 07/22/2021 FINAL  Final  Blood culture (routine x 2)     Status: None   Collection Time: 07/17/21  6:41 PM   Specimen: Left Antecubital; Blood  Result Value Ref Range Status   Specimen Description   Final    LEFT ANTECUBITAL BOTTLES DRAWN AEROBIC AND ANAEROBIC   Special Requests   Final    Blood Culture results may not be optimal due to an excessive volume of blood received in culture bottles   Culture   Final    NO GROWTH 5 DAYS Performed at [facility], [address] [place], [place] [address]    Report Status 07/22/2021 FINAL  Final  MRSA Next Gen by PCR, Nasal     Status: None   Collection Time:  07/18/21  5:32 AM   Specimen: Nasal Mucosa; Nasal Swab  Result Value Ref Range Status  MRSA by PCR Next Gen NOT DETECTED NOT DETECTED Final    Comment: (NOTE) The GeneXpert MRSA Assay (FDA approved for NASAL specimens only), is one component of a comprehensive MRSA colonization surveillance program. It is not intended to diagnose MRSA infection nor to guide or monitor treatment for MRSA infections. Test performance is not FDA approved in patients less than 74 years old. Performed at [facility], [address], [place], [place] [address]          Radiology Studies: DG CHEST PORT 1 VIEW  Result Date: 07/25/2021 CLINICAL DATA:  Cough. EXAM: PORTABLE CHEST 1 VIEW COMPARISON:  07/20/2021 FINDINGS: 1247 hours. Interval extubation and NG tube removal. Right IJ central line seen previously has been removed in the interval. Low lung volumes with basilar atelectasis/infiltrate, left greater than right. Interstitial markings are diffusely coarsened with chronic features. Cardiopericardial silhouette is at upper limits of normal for size. Telemetry leads overlie the chest. IMPRESSION: 1. Interval extubation and NG tube removal. 2. Low lung volumes with basilar atelectasis/infiltrate, left greater than right. Electronically Signed   By: Kennith Center M.D.   On: 07/25/2021 13:03        Scheduled Meds:  cefdinir  300 mg Oral Q12H   Chlorhexidine Gluconate Cloth  6 each Topical Daily   levothyroxine  137 mcg Oral Q0600   lidocaine  15 mL Mouth/Throat TID   [START ON 07/27/2021] LORazepam  0.5 mg Oral Daily   LORazepam  1 mg Oral QHS   mouth rinse  15 mL Mouth Rinse BID   polyethylene glycol  17 g Oral Daily   tamsulosin  0.4 mg Oral Daily   Continuous Infusions:      LOS: 8 days    Time spent: 35 minutes    Erick Blinks, MD [facility]  If 7PM-7AM, please contact night-coverage [email or web address] 07/26/2021, 9:03 PM

## 2021-07-27 DIAGNOSIS — N3001 Acute cystitis with hematuria: Secondary | ICD-10-CM | POA: Diagnosis not present

## 2021-07-27 DIAGNOSIS — A419 Sepsis, unspecified organism: Secondary | ICD-10-CM | POA: Diagnosis not present

## 2021-07-27 DIAGNOSIS — E039 Hypothyroidism, unspecified: Secondary | ICD-10-CM | POA: Diagnosis not present

## 2021-07-27 DIAGNOSIS — N320 Bladder-neck obstruction: Secondary | ICD-10-CM | POA: Diagnosis not present

## 2021-07-27 MED ORDER — VALACYCLOVIR HCL 500 MG PO TABS
1000.0000 mg | ORAL_TABLET | Freq: Three times a day (TID) | ORAL | Status: DC
  Administered 2021-07-27 – 2021-07-29 (×5): 1000 mg via ORAL
  Filled 2021-07-27 (×5): qty 2

## 2021-07-27 MED ORDER — ENSURE MAX PROTEIN PO LIQD: 11.0000 [oz_av] | Freq: Every day | ORAL | Status: DC

## 2021-07-27 MED ORDER — LIDOCAINE 5 % EX OINT
TOPICAL_OINTMENT | Freq: Three times a day (TID) | CUTANEOUS | Status: DC
  Filled 2021-07-27: qty 35.44

## 2021-07-27 MED ORDER — ENSURE ENLIVE PO LIQD
237.0000 mL | Freq: Three times a day (TID) | ORAL | Status: DC
  Administered 2021-07-27 – 2021-07-28 (×4): 237 mL via ORAL

## 2021-07-27 NOTE — Progress Notes (Signed)
Speech Language Pathology Treatment: Dysphagia  Patient Details Name: Patrick Kerr MRN: [id number] DOB: [birth year] Today's Date: 07/27/2021 Time: 6629-4765 SLP Time Calculation (min) (ACUTE ONLY): 22 min  Assessment / Plan / Recommendation Clinical Impression  Pt seen for ongoing dysphagia intervention. He has labial blisters and oozing which are very painful and he is unable to comfortably eat/drink. The sores appear localized to his lips and around lips/nose only and not inside his oral cavity. Pt consumed straw sips of thin liquids with one delayed cough, otherwise WNL. SLP will continue to follow. He may benefit from [employer] on his tray.    HPI HPI: Patrick Kerr is a 75 y.o. male with medical history significant for hypertension, BPH with history of TURP, hypothyroidism who presents to the emergency department accompanied by daughter due to weakness.  Patient was unable to provide history, history was obtained from ED physician and daughter at bedside.  Per report, patient recently had circumcision in October 2022 due to recent development of phimosis, patient did complain of burning penile sensation prior to and after circumcision.  Daughter states that patient also complained of worsening burning sensation in the rectum when he has a bowel movement.  Outside screening colonoscopy done in October at bedside showed small polyp and some internal hemorrhoids, but patient denies any rectal bleeding.  He was prescribed with 7-day course of doxycycline which was followed with Macrobid for possible prostate infection, but there was no improvement in symptoms. Foley catheter was placed on 11/4 and this was removed on 12/5. Per daughter, PCP was seen about 4 days ago and blood work done was reported to be positive for hypokalemia and anemia.  Patient continued to show some weakness, so daughter decided to take patient to the ED for further evaluation and management.  There was no report of fever,  chest pain, shortness of breath, headache      SLP Plan  Continue with current plan of care      Recommendations for follow up therapy are one component of a multi-disciplinary discharge planning process, led by the attending physician.  Recommendations may be updated based on patient status, additional functional criteria and insurance authorization.    Recommendations  Diet recommendations: Dysphagia 2 (fine chop);Thin liquid Liquids provided via: Cup;Straw Medication Administration: Whole meds with puree Supervision: Staff to assist with self feeding;Full supervision/cueing for compensatory strategies Compensations: Slow rate;Small sips/bites Postural Changes and/or Swallow Maneuvers: Seated upright 90 degrees;Upright 30-60 min after meal                Oral Care Recommendations: Oral care BID;Staff/trained caregiver to provide oral care Plan: Continue with current plan of care         Thank you,  Havery Moros, CCC-SLP [phone]   [name]  07/27/2021, 4:30 PM

## 2021-07-27 NOTE — Progress Notes (Addendum)
PROGRESS NOTE    Patrick Kerr  [id number] DOB: Dec 11, 1945 DOA: 07/17/2021 PCP: [facility]   Brief Narrative:   Patrick Kerr is a 75 y.o. male with medical history significant for hypertension, BPH with history of TURP, hypothyroidism who presents to the emergency department accompanied by daughter due to weakness.  Patient was unable to provide history, history was obtained from ED physician and daughter at bedside.  Per report, patient recently had circumcision in October 2022 due to recent development of phimosis, patient did complain of burning penile sensation prior to and after circumcision.  He was prescribed a 7-day course of doxycycline followed with Macrobid for possible prostate infection through the [place] system and had urine culture demonstrating Klebsiella that was sensitive to Rocephin.  He continued to have ongoing weakness despite use of antibiotics.  He was admitted with septic shock present on admission secondary to Klebsiella UTI.  His sepsis physiology is now improving and he has been extubated on 12/20.  Hospital course complicated by development of altered mental status/agitation.  There was concern for possible serotonin syndrome and sertraline/Abilify were discontinued.  He was treated with Ativan and overall mental status is better.  He has developed a progressive stomatitis which has made it difficult for him to eat.  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  Anticipate discharge home once his oral intake has improved  Assessment & Plan:   Principal Problem:   Sepsis secondary to UTI [facility]) Active Problems:   Macrocytic anemia   Hypokalemia   Thrombocytopenia (HCC)   Bladder outlet obstruction   Low TSH level   Lactic acidosis   Obesity (BMI 30.0-34.9)   Essential hypertension   BPH (benign prostatic hyperplasia)   Acquired hypothyroidism   Pressure injury of skin   Septic shock secondary to UTI-Klebsiella, present on admission -Briefly did  require vasopressors, norepinephrine has been weaned off -Initially treated with IV ceftriaxone -He has been transitioned to [place] to have been adequately treated with antibiotics.  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 Alyson Ingles, urology who recommended to continue current catheter until he is able to follow-up with urology as an outpatient   Intractable nausea and vomiting with hypotension related to above factors -Patient intubated for airway protection -Patient extubated on 12/20 -Appreciate PCCM assistance  Acute metabolic encephalopathy -Initially felt to be related to medications given while patient was mechanically ventilated, also likely had component related to infectious process -mental status had improved to baseline -noted to have confusion and agitation on 12/24. He was also noted to be hyperreflexic in lower extremities and had increased tone. Since patient is on large dose of sertraline, concern for developing serotonin syndrome -Sertraline and Abilify were discontinued -treat agitation and tremors with lorazepam -labs otherwise unrevealing -Overall mental status, hyperreflexia and rigidity have since improved -Other possibilities in differential included benzodiazepine withdrawal, although daughter reports that he only takes 0.5 mg of Ativan twice  daily.  Review of records indicate that he was off of Ativan for approximately 3 days during his hospital course, but during this time he was lethargic from residual effects of propofol.  Depression/PTSD -He is followed at the [place] -Since his sertraline and Abilify were discontinued due to possible serotonin syndrome, will request psychiatry to review medications and see if they can make any recommendations regarding further therapies for his underlying mental health issues -Daughter is requesting referral to [facility] health behavioral health for patient follow-up.  AKI -Mild with creatinine increasing to 1.3 -Now resolved -Suspect related to hemodynamic instabilities/sepsis   Hypokalemia -Replete and continue monitoring   Prolonged QTC -Avoid QT prolonging medications and supplement electrolytes   Thrombocytopenia -Suspect related to infectious process -Levels appear to be improving -Continue monitoring   Essential hypertension -Using as needed hydralazine for elevated pressures -Blood pressures currently stable   Hypothyroidism -Continue Synthroid   BPH -On Flomax  Dysphagia -Seen by speech therapy and started on dysphagia 2 diet  Generalized weakness -PT/OT -Daughter is wanting to take him home with home health aides -Awaiting for delivery of home equipment including hospital bed, wheelchair and Hoyer lift since moving the patient home without this equipment will be difficult   Pressure injury, medial coccyx, stage II, not present on admission Pressure Injury 07/23/21 Coccyx Medial Stage 2 -  Partial thickness loss of dermis presenting as a shallow open injury with a red, pink wound bed without slough. pink wound base (Active)  07/23/21 1844  Location: Coccyx  Location Orientation: Medial  Staging: Stage 2 -  Partial thickness loss of dermis presenting as a shallow open injury with a red, pink wound bed without slough.  Wound Description (Comments): pink wound base   Present on Admission:   Continue supportive management       DVT prophylaxis: SCDs Code Status: Full Family Communication: Spoke with daughter at bedside 12/27 Disposition Plan:  Status is: Inpatient   Remains inpatient appropriate because: Safe discharge plan Home, continue treatment of encephalopathy   Consultants:  PCCM   Procedures:  12/18 intubation 12/20 extubation  Antimicrobials:  Anti-infectives (From admission, onward)    Start     Dose/Rate Route Frequency Ordered Stop   07/27/21 2200  valACYclovir (VALTREX) tablet 1,000 mg        1,000 mg Oral 3 times daily 07/27/21 1733     07/24/21 1000  cefdinir (OMNICEF) capsule 300 mg  Status:  Discontinued        300 mg Oral Every 12 hours 07/23/21 1019 07/27/21 1735   07/19/21 0700  cefTRIAXone (ROCEPHIN) 2 g in sodium chloride 0.9 % 100 mL IVPB  Status:  Discontinued        2 g 200 mL/hr over 30 Minutes Intravenous Every 24 hours 07/18/21 1157 07/23/21 1019   07/18/21 0800  cefTRIAXone (ROCEPHIN) 2 g in sodium chloride 0.9 % 100 mL IVPB  Status:  Discontinued        2 g 200 mL/hr over 30 Minutes Intravenous Every 24 hours 07/18/21 0711 07/18/21 1106   07/17/21 1645  cefTRIAXone (ROCEPHIN) 1 g in sodium chloride 0.9 % 100 mL IVPB        1 g 200 mL/hr over 30 Minutes Intravenous  Once 07/17/21 1632 07/17/21 1835   07/17/21 0000  cephALEXin (KEFLEX) 500 MG capsule        500 mg Oral 4 times daily 07/17/21 1736         Subjective: He is more awake  today and coherent.  Daughter is at the bedside.  Denies any shortness of breath.  Does admit to having pain in his lips when he eats.  Objective: Vitals:   07/26/21 2033 07/27/21 0500 07/27/21 1353 07/27/21 1623  BP: (!) 144/70 (!) 147/65 (!) 109/94   Pulse: 84 83 84   Resp: 20 19 18    Temp: 98 F (36.7 C) 97.8 F (36.6 C) 99.1 F (37.3 C)   TempSrc: Oral  Oral   SpO2: 94% 93% 93% 93%  Weight:      Height:        Intake/Output Summary (Last 24 hours) at  07/27/2021 2023 Last data filed at 07/27/2021 1300 Gross per 24 hour  Intake 540 ml  Output 600 ml  Net -60 ml   Filed Weights   07/20/21 0332 07/21/21 0429 07/22/21 0500  Weight: 130.8 kg 129.7 kg 127.5 kg    Examination:  General exam: Somnolent, wakes up to voice HEENT: No lesions noted in oral cavity.  Overall blisters and swelling of lips appears mildly better today.  He does have some blisters developing around his mouth as well. Respiratory system: Clear to auscultation. Respiratory effort normal. Cardiovascular system:RRR. No murmurs, rubs, gallops. Gastrointestinal system: Abdomen is nondistended, soft and nontender. No organomegaly or masses felt. Normal bowel sounds heard. Central nervous system: No focal neurological deficits.   Extremities: No C/C/E, +pedal pulses Skin: No rashes, lesions or ulcers Psychiatry: Awake, answers questions appropriately   Data Reviewed: I have personally reviewed following labs and imaging studies  CBC: Recent Labs  Lab 07/21/21 0401 07/22/21 0431 07/23/21 0618 07/25/21 0851 07/26/21 0428  WBC 11.2* 8.1 6.9 6.7 6.3  HGB 9.4* 10.4* 10.1* 10.2* 10.1*  HCT 29.6* 32.1* 30.9* 31.1* 32.0*  MCV 108.8* 108.4* 107.3* 106.5* 106.3*  PLT 68* 76* 100* 147* 165   Basic Metabolic Panel: Recent Labs  Lab 07/21/21 0401 07/22/21 0431 07/23/21 0618 07/24/21 0500 07/25/21 0851 07/26/21 0428  NA 138 140 139 137 137 137  K 3.6 3.5 3.4* 3.5 3.3* 3.4*  CL 107 107 105 106 104 104  CO2 25 25 25 25 23  21*  GLUCOSE 85 84 95 94 93 87  BUN 29* 24* 23 19 12 9   CREATININE 0.87 0.85 0.68 0.70 0.83 0.69  CALCIUM 8.0* 8.3* 8.2* 8.2* 8.4* 8.5*  MG 1.8 1.6* 1.8  --   --  1.9  PHOS  --  3.0  --   --   --  3.8   GFR: Estimated Creatinine Clearance: 114.6 mL/min (by C-G formula based on SCr of 0.69 mg/dL). Liver Function Tests: Recent Labs  Lab 07/21/21 0401 07/22/21 0431 07/25/21 0851  AST 58* 53* 39  ALT 42 44 37  ALKPHOS 69 80 77  BILITOT  1.4* 0.9 0.8  PROT 5.3* 5.7* 6.3*  ALBUMIN 2.4* 2.6* 3.0*   No results for input(s): LIPASE, AMYLASE in the last 168 hours. Recent Labs  Lab 07/25/21 0851  AMMONIA 20   Coagulation Profile: No results for input(s): INR, PROTIME in the last 168 hours. Cardiac Enzymes: Recent Labs  Lab 07/26/21 0428  CKTOTAL 39*   BNP (last 3 results) No results for input(s): PROBNP in the last 8760 hours. HbA1C: No results for input(s): HGBA1C in the last 72 hours. CBG: Recent Labs  Lab 07/21/21 0424 07/21/21 0730 07/21/21 1138 07/21/21 1536 07/22/21 1621  GLUCAP 82 86 84 84 142*   Lipid Profile: No results for input(s): CHOL, HDL, LDLCALC, TRIG, CHOLHDL,  LDLDIRECT in the last 72 hours.  Thyroid Function Tests: No results for input(s): TSH, T4TOTAL, FREET4, T3FREE, THYROIDAB in the last 72 hours. Anemia Panel: No results for input(s): VITAMINB12, FOLATE, FERRITIN, TIBC, IRON, RETICCTPCT in the last 72 hours.  Sepsis Labs: Recent Labs  Lab 07/21/21 0401  LATICACIDVEN 0.9    Recent Results (from the past 240 hour(s))  MRSA Next Gen by PCR, Nasal     Status: None   Collection Time: 07/18/21  5:32 AM   Specimen: Nasal Mucosa; Nasal Swab  Result Value Ref Range Status   MRSA by PCR Next Gen NOT DETECTED NOT DETECTED Final    Comment: (NOTE) The GeneXpert MRSA Assay (FDA approved for NASAL specimens only), is one component of a comprehensive MRSA colonization surveillance program. It is not intended to diagnose MRSA infection nor to guide or monitor treatment for MRSA infections. Test performance is not FDA approved in patients less than 71 years old. Performed at [facility], [address], [place], [place] [address]          Radiology Studies: No results found.      Scheduled Meds:  Chlorhexidine Gluconate Cloth  6 each Topical Daily   feeding supplement  237 mL Oral TID BM   levothyroxine  137 mcg Oral Q0600   lidocaine   Topical TID   LORazepam  0.5 mg Oral  Daily   LORazepam  1 mg Oral QHS   mouth rinse  15 mL Mouth Rinse BID   polyethylene glycol  17 g Oral Daily   [START ON 07/28/2021] Ensure Max Protein  11 oz Oral Daily   tamsulosin  0.4 mg Oral Daily   valACYclovir  1,000 mg Oral TID   Continuous Infusions:      LOS: 9 days    Time spent: 35 minutes    Kathie Dike, MD [facility]  If 7PM-7AM, please contact night-coverage [email or web address] 07/27/2021, 8:23 PM

## 2021-07-27 NOTE — Progress Notes (Signed)
Confirmed with lab that the herpes simplex virus PCR has been sent out and still pending. Result pending and unsure on time remaining since it was send out test.

## 2021-07-27 NOTE — Progress Notes (Signed)
Physical Therapy Treatment Patient Details Name: Patrick Kerr MRN: [id number] DOB: 12-30-1945 Today's Date: 07/27/2021   History of Present Illness Patrick Kerr is a 75 y.o. male with medical history significant for hypertension, BPH with history of TURP, hypothyroidism who presents to the emergency department accompanied by daughter due to weakness.  Patient was unable to provide history, history was obtained from ED physician and daughter at bedside.  Per report, patient recently had circumcision in October 2022 due to recent development of phimosis, patient did complain of burning penile sensation prior to and after circumcision.  Daughter states that patient also complained of worsening burning sensation in the rectum when he has a bowel movement.  Outside screening colonoscopy done in October at bedside showed small polyp and some internal hemorrhoids, but patient denies any rectal bleeding.  He was prescribed with 7-day course of doxycycline which was followed with Macrobid for possible prostate infection, but there was no improvement in symptoms. Foley catheter was placed on 11/4 and this was removed on 12/5. Per daughter, PCP was seen about 4 days ago and blood work done was reported to be positive for hypokalemia and anemia.  Patient continued to show some weakness, so daughter decided to take patient to the ED for further evaluation and management.  There was no report of fever, chest pain, shortness of breath, headache    PT Comments    Patient demonstrates increased endurance/distance for taking slow labored unsteady side steps with mostly shuffling of feet and occasional tactile assistance to move feet laterally.  Patient requires verbal cueing and demonstration for completing BLE strengthening exercises with fair carryover and limited mostly for taking steps due to generalized weakness and fatigue.  Patient tolerated sitting up in chair after therapy with son-n-law present in room -  nurse notified.  Patient will benefit from continued skilled physical therapy in hospital and recommended venue below to increase strength, balance, endurance for safe ADLs and gait.    Recommendations for follow up therapy are one component of a multi-disciplinary discharge planning process, led by the attending physician.  Recommendations may be updated based on patient status, additional functional criteria and insurance authorization.  Follow Up Recommendations  Skilled nursing-short term rehab (<3 hours/day)     Assistance Recommended at Discharge Intermittent Supervision/Assistance  Equipment Recommendations  None recommended by PT    Recommendations for Other Services       Precautions / Restrictions Precautions Precautions: Fall Restrictions Weight Bearing Restrictions: No     Mobility  Bed Mobility Overal bed mobility: Needs Assistance Bed Mobility: Supine to Sit     Supine to sit: Mod assist     General bed mobility comments: increased time, labored movement, improvement for using BUE to help scoot self to EOB    Transfers Overall transfer level: Needs assistance Equipment used: Rolling walker (2 wheels) Transfers: Sit to/from Stand;Bed to chair/wheelchair/BSC Sit to Stand: Min assist;Mod assist     Step pivot transfers: Mod assist     General transfer comment: demonstrates increased BLE strength for completing sit to stands and transfers with less assistance    Ambulation/Gait Ambulation/Gait assistance: Mod assist Gait Distance (Feet): 6 Feet Assistive device: Rolling walker (2 wheels) Gait Pattern/deviations: Decreased step length - right;Decreased step length - left;Decreased stride length;Shuffle Gait velocity: decreased     General Gait Details: demonstrates increased endurance/distance for taking slow labored unsteady side steps with mostly shuffling of feet and occasional tactile assistance to move feet laterally   Stairs  Wheelchair Mobility    Modified Rankin (Stroke Patients Only)       Balance Overall balance assessment: Needs assistance Sitting-balance support: Feet supported;No upper extremity supported Sitting balance-Leahy Scale: Good Sitting balance - Comments: seated at EOB   Standing balance support: Reliant on assistive device for balance;During functional activity;Bilateral upper extremity supported Standing balance-Leahy Scale: Fair Standing balance comment: fair/poor using RW                            Cognition Arousal/Alertness: Awake/alert Behavior During Therapy: WFL for tasks assessed/performed Overall Cognitive Status: Within Functional Limits for tasks assessed                                          Exercises General Exercises - Lower Extremity Long Arc Quad: Seated;AROM;Strengthening;Both;10 reps Hip Flexion/Marching: Seated;AROM;Strengthening;Both;5 reps Toe Raises: Seated;AROM;Strengthening;Both;10 reps Heel Raises: Seated;AROM;Strengthening;Both;10 reps Mini-Sqauts: Standing;AAROM;Strengthening;Both;10 reps    General Comments        Pertinent Vitals/Pain Pain Assessment: No/denies pain    Home Living                          Prior Function            PT Goals (current goals can now be found in the care plan section) Acute Rehab PT Goals Patient Stated Goal: return home with family to assist PT Goal Formulation: With patient/family Time For Goal Achievement: 08/11/21 Potential to Achieve Goals: Good Progress towards PT goals: Progressing toward goals    Frequency    Min 3X/week      PT Plan Current plan remains appropriate    Co-evaluation              AM-PAC PT "6 Clicks" Mobility   Outcome Measure  Help needed turning from your back to your side while in a flat bed without using bedrails?: A Lot Help needed moving from lying on your back to sitting on the side of a flat bed without  using bedrails?: A Lot Help needed moving to and from a bed to a chair (including a wheelchair)?: A Lot Help needed standing up from a chair using your arms (e.g., wheelchair or bedside chair)?: A Lot Help needed to walk in hospital room?: A Lot Help needed climbing 3-5 steps with a railing? : Total 6 Click Score: 11    End of Session Equipment Utilized During Treatment: Gait belt Activity Tolerance: Patient tolerated treatment well;Patient limited by fatigue Patient left: in chair;with call bell/phone within reach Nurse Communication: Mobility status PT Visit Diagnosis: Unsteadiness on feet (R26.81);Other abnormalities of gait and mobility (R26.89);Muscle weakness (generalized) (M62.81)     Time: 1034-1100 PT Time Calculation (min) (ACUTE ONLY): 26 min  Charges:  $Therapeutic Exercise: 8-22 mins $Therapeutic Activity: 8-22 mins                     2:07 PM, 07/27/21 Ocie Bob, MPT Physical Therapist with [facility] [phone] office [phone] mobile phone

## 2021-07-27 NOTE — Progress Notes (Signed)
Nutrition Follow-up  DOCUMENTATION CODES:   Obesity unspecified  INTERVENTION:  Ensure Enlive TID due to poor meal intake   Assist with feeding if no family present  NUTRITION DIAGNOSIS:   Inadequate oral intake related to inability to eat as evidenced by NPO status.  -Progressing with diet advancement   GOAL:   Pt to meet >/= 90% of their estimated nutrition needs    -ongoing  MONITOR:  Po intake, labs and wt trends   ASSESSMENT: Patient is a 75 yo with hx of HTN, who presents with septic shock/UTI  on 12/17. Intubated 12/18-12/20.   12/21 Bedside swallow evaluation SLP pt not safe for diet advancement. 12/22 Cleared for Dysphagia 3 diet / thin liquids 12/23 downgraded by SLP to Dys 2  Medications reviewed. Patient weight has decreased 3.3 kg 2.6% the past week which is significant.   12/27 pt up in chair attempting to eat applesauce. Needs assistance with feeding due to tremors per son in law. PO: intake 0-50% currently. He is at high risk for weight and malnutrition.  When he is able to advance diet fingers foods may be easier for him to manage? He is at high risk for weight loss due to sore mouth/bloody lips. His daughter is here bedside today and is a Data processing manager. She has brought yogurt and beverages that he likes.   Labs: BMP Latest Ref Rng & Units 07/26/2021 07/25/2021 07/24/2021  Glucose 70 - 99 mg/dL 87 93 94  BUN 8 - 23 mg/dL 9 12 19   Creatinine 0.61 - 1.24 mg/dL 3.66 2.94  Sodium 135 - 145 mmol/L 137 137 137  Potassium 3.5 - 5.1 mmol/L 3.4(L) 3.3(L) 3.5  Chloride 98 - 111 mmol/L 104 104 106  CO2 22 - 32 mmol/L 21(L) 23 25  Calcium 8.9 - 10.3 mg/dL 7.65) 4.6(T) 0.3(T)    Diet Order:   Diet Order             DIET DYS 2 Room service appropriate? Yes; Fluid consistency: Thin  Diet effective now                   EDUCATION NEEDS:   Not appropriate for education at this time  Skin:  Skin Assessment: Reviewed RN Assessment  Last BM:   12/24  Height:   Ht Readings from Last 1 Encounters:  07/20/21 6\' 4"  (1.93 m)    Weight:   Wt Readings from Last 1 Encounters:  07/22/21 127.5 kg    Ideal Body Weight:   92 kg  BMI:  Body mass index is 34.21 kg/m.  Estimated Nutritional Needs:   Kcal:  2200-2300  Protein:  129-138  Fluid:  2 liters daily  MS,RD,CSG,LDN Contact: 07/24/21

## 2021-07-27 NOTE — TOC Progression Note (Signed)
Transition of Care [facility]) - Progression Note    Patient Details  Name: Patrick Kerr MRN: [id number] Date of Birth: 10-03-1945  Transition [facility] CM/SW Contact  Annice Needy, LCSW Phone Number: 07/27/2021, 11:56 AM  Clinical Narrative:    Patient's DME is scheduled to be delivered today per Morrie Sheldon at [facility].   Expected Discharge Plan: Home w Home Health Services Barriers to Discharge: Continued Medical Work up  Expected Discharge Plan and Services Expected Discharge Plan: Home w Home Health Services     Post Acute Care Choice: Durable Medical Equipment, Home Health Living arrangements for the past 2 months: Single Family Home                 DME Arranged: 3-N-1, Hospital bed, Wheelchair manual (Lift) DME Agency: [facility] Date DME Agency Contacted: 07/23/21 Time DME Agency Contacted: [phone] Representative spoke with at DME Agency: Morrie Sheldon HH Arranged: RN, PT, OT, Speech Therapy HH Agency: [facility] Date HH Agency Contacted: 07/23/21 Time HH Agency Contacted: [phone] Representative spoke with at [facility] Agency: Jerilynn Som   Social Determinants of Health (SDOH) Interventions    Readmission Risk Interventions No flowsheet data found.

## 2021-07-28 DIAGNOSIS — N390 Urinary tract infection, site not specified: Secondary | ICD-10-CM | POA: Diagnosis not present

## 2021-07-28 DIAGNOSIS — A419 Sepsis, unspecified organism: Secondary | ICD-10-CM | POA: Diagnosis not present

## 2021-07-28 LAB — RENAL FUNCTION PANEL
Albumin: 3.1 g/dL — ABNORMAL LOW (ref 3.5–5.0)
Anion gap: 7 (ref 5–15)
BUN: 7 mg/dL — ABNORMAL LOW (ref 8–23)
CO2: 25 mmol/L (ref 22–32)
Calcium: 8.8 mg/dL — ABNORMAL LOW (ref 8.9–10.3)
Chloride: 105 mmol/L (ref 98–111)
Creatinine, Ser: 0.74 mg/dL (ref 0.61–1.24)
GFR, Estimated: >60 mL/min (ref >60)
Glucose, Bld: 103 mg/dL — ABNORMAL HIGH (ref 70–99)
Phosphorus: 3.3 mg/dL (ref 2.5–4.6)
Potassium: 3.6 mmol/L (ref 3.5–5.1)
Sodium: 137 mmol/L (ref 135–145)

## 2021-07-28 LAB — CBC
HCT: 31.6 % — ABNORMAL LOW (ref 39.0–52.0)
Hemoglobin: 10.4 g/dL — ABNORMAL LOW (ref 13.0–17.0)
MCH: 34.8 pg — ABNORMAL HIGH (ref 26.0–34.0)
MCHC: 32.9 g/dL (ref 30.0–36.0)
MCV: 105.7 fL — ABNORMAL HIGH (ref 80.0–100.0)
Platelets: 209 10*3/uL (ref 150–400)
RBC: 2.99 MIL/uL — ABNORMAL LOW (ref 4.22–5.81)
RDW: 12 % (ref 11.5–15.5)
WBC: 4.3 10*3/uL (ref 4.0–10.5)
nRBC: 0 % (ref 0.0–0.2)

## 2021-07-28 LAB — HSV DNA BY PCR (REFERENCE LAB)
HSV 1 DNA: POSITIVE — AB
HSV 2 DNA: NEGATIVE

## 2021-07-28 LAB — MAGNESIUM: Magnesium: 1.8 mg/dL (ref 1.7–2.4)

## 2021-07-28 MED ORDER — MAGIC MOUTHWASH W/LIDOCAINE
5.0000 mL | Freq: Three times a day (TID) | ORAL | Status: DC
  Administered 2021-07-28 – 2021-07-29 (×4): 5 mL via ORAL
  Filled 2021-07-28 (×4): qty 5

## 2021-07-28 NOTE — Progress Notes (Signed)
Physical Therapy Treatment Patient Details Name: Patrick Kerr MRN: [id number] DOB: 24-Sep-1945 Today's Date: 07/28/2021   History of Present Illness Patrick Kerr is a 75 y.o. male with medical history significant for hypertension, BPH with history of TURP, hypothyroidism who presents to the emergency department accompanied by daughter due to weakness.  Patient was unable to provide history, history was obtained from ED physician and daughter at bedside.  Per report, patient recently had circumcision in October 2022 due to recent development of phimosis, patient did complain of burning penile sensation prior to and after circumcision.  Daughter states that patient also complained of worsening burning sensation in the rectum when he has a bowel movement.  Outside screening colonoscopy done in October at bedside showed small polyp and some internal hemorrhoids, but patient denies any rectal bleeding.  He was prescribed with 7-day course of doxycycline which was followed with Macrobid for possible prostate infection, but there was no improvement in symptoms. Foley catheter was placed on 11/4 and this was removed on 12/5. Per daughter, PCP was seen about 4 days ago and blood work done was reported to be positive for hypokalemia and anemia.  Patient continued to show some weakness, so daughter decided to take patient to the ED for further evaluation and management.  There was no report of fever, chest pain, shortness of breath, headache    PT Comments    Pt friendly and willing to participate with co-treat for therapy.  Pt able to recall name, DOB, and states he is in [place] [facility].  Pt required multimodal cueing, able to follow 1 step commands.  Mod A with transfer training and gait training, able to complete sidestep and retro gait to chair.  Slow labored movements and cueing for hand and leg placement prior sitting for safety.  EOS pt left in chair with call bell within reach, chair alarm set  and lunch table placed infront for lunch.  RN aware of status.    Recommendations for follow up therapy are one component of a multi-disciplinary discharge planning process, led by the attending physician.  Recommendations may be updated based on patient status, additional functional criteria and insurance authorization.  Follow Up Recommendations  Skilled nursing-short term rehab (<3 hours/day)     Assistance Recommended at Discharge Intermittent Supervision/Assistance  Equipment Recommendations  None recommended by PT    Recommendations for Other Services       Precautions / Restrictions Precautions Precautions: Fall Restrictions Weight Bearing Restrictions: No     Mobility  Bed Mobility Overal bed mobility: Needs Assistance Bed Mobility: Supine to Sit     Supine to sit: Min assist     General bed mobility comments: Increased time and labored movement. Pt needing single hand  held assist and use of railing.  Elevated height for assistance standing.    Transfers Overall transfer level: Needs assistance Equipment used: Rolling walker (2 wheels) Transfers: Sit to/from Stand Sit to Stand: Min assist;Mod assist;+2 physical assistance     Step pivot transfers: Mod assist     General transfer comment: Verbal and tactile cuing to continue stepping to chair prior to sitting. Slow labored movement.  Cueing for hand placement for safety with RW    Ambulation/Gait Ambulation/Gait assistance: Mod assist Gait Distance (Feet): 6 Feet Assistive device: Rolling walker (2 wheels) Gait Pattern/deviations: Decreased step length - right;Decreased step length - left;Decreased stride length;Shuffle Gait velocity: decreased     General Gait Details: demonstrated slow labored movement, unsteady required  mod A for safety with RW.  Cueing for foot clearance and importance of feeling chair behind legs prior sitting to reduce risk of fall.  Cueing for handplacement for eccentric control  lowering to chair.   Stairs             Wheelchair Mobility    Modified Rankin (Stroke Patients Only)       Balance Overall balance assessment: Needs assistance Sitting-balance support: Feet supported;No upper extremity supported Sitting balance-Leahy Scale: Good Sitting balance - Comments: seated at EOB   Standing balance support: Reliant on assistive device for balance;During functional activity;Bilateral upper extremity supported Standing balance-Leahy Scale: Fair Standing balance comment: fair/poor using RW                            Cognition Arousal/Alertness: Awake/alert Behavior During Therapy: WFL for tasks assessed/performed Overall Cognitive Status: Within Functional Limits for tasks assessed Area of Impairment: Following commands                       Following Commands: Follows one step commands consistently                Exercises General Exercises - Upper Extremity Shoulder Flexion: AROM;10 reps;Seated Shoulder ABduction: AROM;10 reps;Seated (x10 protraction.) Shoulder Horizontal ABduction: AROM;10 reps;Seated General Exercises - Lower Extremity Ankle Circles/Pumps: Seated;AROM;Strengthening;Both;15 reps Long Arc Quad: Seated;AROM;Strengthening;Both;10 reps Hip ABduction/ADduction: AROM;Both;10 reps;Seated Hip Flexion/Marching: Seated;AROM;Strengthening;Both;10 reps Toe Raises: Seated;AROM;Strengthening;Both;10 reps Heel Raises: Seated;AROM;Strengthening;Both;10 reps    General Comments        Pertinent Vitals/Pain Pain Assessment: No/denies pain Faces Pain Scale: No hurt    Home Living                          Prior Function            PT Goals (current goals can now be found in the care plan section)      Frequency    Min 3X/week      PT Plan Current plan remains appropriate    Co-evaluation   Reason for Co-Treatment: Complexity of the patient's impairments (multi-system  involvement);For patient/therapist safety;To address functional/ADL transfers PT goals addressed during session: Mobility/safety with mobility;Balance;Proper use of DME;Strengthening/ROM OT goals addressed during session: ADL's and self-care      AM-PAC PT "6 Clicks" Mobility   Outcome Measure  Help needed turning from your back to your side while in a flat bed without using bedrails?: A Lot Help needed moving from lying on your back to sitting on the side of a flat bed without using bedrails?: A Lot Help needed moving to and from a bed to a chair (including a wheelchair)?: A Lot Help needed standing up from a chair using your arms (e.g., wheelchair or bedside chair)?: A Lot Help needed to walk in hospital room?: A Lot Help needed climbing 3-5 steps with a railing? : Total 6 Click Score: 11    End of Session Equipment Utilized During Treatment: Gait belt Activity Tolerance: Patient tolerated treatment well;Patient limited by fatigue Patient left: in chair;with call bell/phone within reach Nurse Communication: Mobility status PT Visit Diagnosis: Unsteadiness on feet (R26.81);Other abnormalities of gait and mobility (R26.89);Muscle weakness (generalized) (M62.81)     Time: 1240-1305 PT Time Calculation (min) (ACUTE ONLY): 25 min  Charges:  $Therapeutic Exercise: 8-22 mins           Becky Sax, LPTA/CLT;  CBIS [phone]  Juel Burrow 07/28/2021, 1:10 PM

## 2021-07-28 NOTE — Progress Notes (Signed)
Speech Language Pathology Treatment: Dysphagia  Patient Details Name: Patrick Kerr MRN: [id number] DOB: 07/15/1946 Today's Date: 07/28/2021 Time: 3267-1245 SLP Time Calculation (min) (ACUTE ONLY): 23 min  Assessment / Plan / Recommendation Clinical Impression  Pt seen for ongoing dysphagia intervention. He is alert, but more confused today compared to yesterday. His daughter is not here however, which may make him more anxious. 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 The inside of his mouth looks WNL. Pt assessed with thin liquids via straw sips and mech soft textures. Pt with prolonged oral prep, but manages well. He remains somewhat guarded due to lip pain. Recommend advance to D3/mech soft to offer more choices and hopefully increase intake. Pt ate half of a banana when SLP sliced into pieces and able to self feed. SLP will continue to follow.    HPI HPI: Patrick Kerr is a 75 y.o. male with medical history significant for hypertension, BPH with history of TURP, hypothyroidism who presents to the emergency department accompanied by daughter due to weakness.  Patient was unable to provide history, history was obtained from ED physician and daughter at bedside.  Per report, patient recently had circumcision in October 2022 due to recent development of phimosis, patient did complain of burning penile sensation prior to and after circumcision.  Daughter states that patient also complained of worsening burning sensation in the rectum when he has a bowel movement.  Outside screening colonoscopy done in October at bedside showed small polyp and some internal hemorrhoids, but patient denies any rectal bleeding.  He was prescribed with 7-day course of doxycycline which was followed with Macrobid for possible prostate infection, but there was no improvement in symptoms. Foley catheter was placed on 11/4 and this was removed on 12/5. Per daughter, PCP was seen about 4  days ago and blood work done was reported to be positive for hypokalemia and anemia.  Patient continued to show some weakness, so daughter decided to take patient to the ED for further evaluation and management.  There was no report of fever, chest pain, shortness of breath, headache      SLP Plan  Continue with current plan of care      Recommendations for follow up therapy are one component of a multi-disciplinary discharge planning process, led by the attending physician.  Recommendations may be updated based on patient status, additional functional criteria and insurance authorization.    Recommendations  Diet recommendations: Dysphagia 3 (mechanical soft);Thin liquid Liquids provided via: Cup;Straw Medication Administration: Whole meds with puree Supervision: Staff to assist with self feeding;Full supervision/cueing for compensatory strategies Compensations: Slow rate;Small sips/bites Postural Changes and/or Swallow Maneuvers: Seated upright 90 degrees;Upright 30-60 min after meal                Oral Care Recommendations: Oral care BID;Staff/trained caregiver to provide oral care Follow Up Recommendations: Skilled nursing-short term rehab (<3 hours/day) Assistance recommended at discharge: Frequent or constant Supervision/Assistance SLP Visit Diagnosis: Dysphagia, unspecified (R13.10) Plan: Continue with current plan of care         Thank you,  Havery Moros, CCC-SLP [phone]   Gilberto Stanforth  07/28/2021, 1:48 PM

## 2021-07-28 NOTE — Progress Notes (Signed)
Patient complained he could not breath out of his nose. Oxygen saturation 95% at room air. Noted patient was anxious, patient stated he felt nervous and agitated. Offered PRN Ativan, patient refused. MD Courage aware.

## 2021-07-28 NOTE — Progress Notes (Signed)
Pt assisted up to [facility] by 2 staff members and using FWW. Pt had large soft BM. Pt back to bed by staff using FWW, weak but able to take small shuffling steps. Pt tolerated well. Denies c/o. Foley cath emptied of of dark yellow urine. Daughter remains at bedside. Bed alarm on for safety, call bell within reach.

## 2021-07-28 NOTE — TOC Progression Note (Signed)
Transition of Care [facility]) - Progression Note    Patient Details  Name: Patrick Kerr MRN: [id number] Date of Birth: [birth year]  [facility] CM/SW Contact  Annice Needy, LCSW Phone Number: 07/28/2021, 5:50 PM  Clinical Narrative:    Message left for [facility] [phone], fax [phone] regarding a form that TOC has to complete for [facility] to be his [employer] company.  Expected Discharge Plan: Home w Home Health Services Barriers to Discharge: Continued Medical Work up  Expected Discharge Plan and Services Expected Discharge Plan: Home w Home Health Services     Post Acute Care Choice: Durable Medical Equipment, Home Health Living arrangements for the past 2 months: Single Family Home                 DME Arranged: 3-N-1, Hospital bed, Wheelchair manual (Lift) DME Agency: [employer] Date DME Agency Contacted: 07/23/21 Time DME Agency Contacted: [phone] Representative spoke with at DME Agency: Morrie Sheldon HH Arranged: RN, PT, OT, Speech Therapy HH Agency: [facility] Date HH Agency Contacted: 07/23/21 Time HH Agency Contacted: [phone] Representative spoke with at [facility] Agency: Jerilynn Som   Social Determinants of Health (SDOH) Interventions    Readmission Risk Interventions No flowsheet data found.

## 2021-07-28 NOTE — Progress Notes (Signed)
PROGRESS NOTE    Patrick Kerr  [id number] DOB: 1946/07/20 DOA: 07/17/2021 PCP: [facility]   Brief Narrative:   Patrick Kerr is a 75 y.o. male with medical history significant for hypertension, BPH with history of TURP, hypothyroidism who presents to the emergency department accompanied by daughter due to weakness.  Patient was unable to provide history, history was obtained from ED physician and daughter at bedside.  Per report, patient recently had circumcision in October 2022 due to recent development of phimosis, patient did complain of burning penile sensation prior to and after circumcision.  He was prescribed a 7-day course of doxycycline followed with Macrobid for possible prostate infection through the [place] system and had urine culture demonstrating Klebsiella that was sensitive to Rocephin.  He continued to have ongoing weakness despite use of antibiotics.  He was admitted with septic shock present on admission secondary to Klebsiella UTI.  His sepsis physiology is now improving and he has been extubated on 12/20.  Hospital course complicated by development of altered mental status/agitation.  There was concern for possible serotonin syndrome and sertraline/Abilify were discontinued.  He was treated with Ativan and overall mental status is better.  He has developed a progressive stomatitis which has made it difficult for him to eat.  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  Anticipate discharge home once his oral intake has improved  Assessment & Plan:   Principal Problem:   Sepsis secondary to UTI [facility]) Active Problems:   Macrocytic anemia   Hypokalemia   Thrombocytopenia (HCC)   Bladder outlet obstruction   Low TSH level   Lactic acidosis   Obesity (BMI 30.0-34.9)   Essential hypertension   BPH (benign prostatic hyperplasia)   Acquired hypothyroidism   Pressure injury of skin   Septic shock secondary to UTI-Klebsiella, present on admission -Briefly did  require vasopressors, norepinephrine has been weaned off -Initially treated with IV ceftriaxone -He has been transitioned to [place] to have been adequately treated with antibiotics.  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 Alyson Ingles, urology who recommended to continue current catheter until he is able to follow-up with urology as an outpatient   Intractable nausea and vomiting with hypotension related to above factors -Patient intubated for airway protection -Patient extubated on 12/20 -Appreciate PCCM assistance  Acute metabolic encephalopathy -Initially felt to be related to medications given while patient was mechanically ventilated, also likely had component related to infectious process -mental status had improved to baseline -noted to have confusion and agitation on 12/24. He was also noted to be hyperreflexic in lower extremities and had increased tone. Since patient is on large dose of sertraline, concern for developing serotonin syndrome -Sertraline and Abilify were discontinued -treat agitation and tremors with lorazepam -labs otherwise unrevealing -Overall mental status, hyperreflexia and rigidity have since improved -Other possibilities in differential included  benzodiazepine withdrawal,  although daughter reports that he only takes 0.5 mg of Ativan twice daily.  Review of records indicate that he was off of Ativan for approximately 3 days during his hospital course, but during this time he was lethargic from residual effects of propofol.  Depression/PTSD -He is followed at the [place] -Since his sertraline and Abilify were discontinued due to possible serotonin syndrome, will request psychiatry to review medications and see if they can make any recommendations regarding further therapies for his underlying mental health issues -Daughter is requesting referral to [facility] health behavioral health for patient follow-up.  AKI -Mild with creatinine increasing to 1.3 -Now resolved -Suspect related to hemodynamic instabilities/sepsis   Hypokalemia -Replete and continue monitoring   Prolonged QTC -Avoid QT prolonging medications and supplement electrolytes   Thrombocytopenia -Suspect related to infectious process -Levels appear to be improving -Continue monitoring   Essential hypertension -Using as needed hydralazine for elevated pressures -Blood pressures currently stable   Hypothyroidism -Continue Synthroid   BPH -On Flomax  Dysphagia -Seen by speech therapy and started on dysphagia 2 diet  Generalized weakness -PT/OT -Daughter is wanting to take him home with home health aides -Awaiting for delivery of home equipment including hospital bed, wheelchair and Hoyer lift since moving the patient home without this equipment will be difficult   Pressure injury, medial coccyx, stage II, not present on admission   Continue supportive management  Disposition--possible discharge Home with daughter and home health services and equipment in 1 to 2 days if tolerating oral intake- -Oral intake remains challenging given stomatitis and oral mucosa and lip Lesions     DVT prophylaxis: SCDs Code Status: Full Family Communication: Spoke with daughter at  bedside 12/28 Disposition Plan:  Status is: Inpatient   Remains inpatient appropriate because: Safe discharge plan Home, continue treatment of encephalopathy   Consultants:  PCCM   Procedures:  12/18 intubation 12/20 extubation  Antimicrobials:  Anti-infectives (From admission, onward)    Start     Dose/Rate Route Frequency Ordered Stop   07/27/21 2200  valACYclovir (VALTREX) tablet 1,000 mg        1,000 mg Oral 3 times daily 07/27/21 1733     07/24/21 1000  cefdinir (OMNICEF) capsule 300 mg  Status:  Discontinued        300 mg Oral Every 12 hours 07/23/21 1019 07/27/21 1735   07/19/21 0700  cefTRIAXone (ROCEPHIN) 2 g in sodium chloride 0.9 % 100 mL IVPB  Status:  Discontinued        2 g 200 mL/hr over 30 Minutes Intravenous Every 24 hours 07/18/21 1157 07/23/21 1019   07/18/21 0800  cefTRIAXone (ROCEPHIN) 2 g in sodium chloride 0.9 % 100 mL IVPB  Status:  Discontinued        2 g 200 mL/hr over 30 Minutes Intravenous Every 24 hours 07/18/21 0711 07/18/21 1106   07/17/21 1645  cefTRIAXone (ROCEPHIN) 1 g in sodium chloride 0.9 % 100 mL IVPB        1 g 200 mL/hr over 30 Minutes Intravenous  Once 07/17/21 1632 07/17/21 1835   07/17/21 0000  cephALEXin (KEFLEX) 500 MG capsule        500 mg Oral 4 times daily 07/17/21 1736         Subjective: -Oral intake remains challenging given stomatitis and oral mucosa and lip Lesions -  Daughter at Bedside, concerns for poor oral intake  Objective: Vitals:   07/27/21 1353 07/27/21 1623 07/27/21 2157 07/28/21 0556  BP: (!) 109/94  Marland Kitchen)  146/58 139/72  Pulse: 84  84 81  Resp: 18  20 18   Temp: 99.1 F (37.3 C)  98.3 F (36.8 C) 98.4 F (36.9 C)  TempSrc: Oral   Oral  SpO2: 93% 93% 93% 93%  Weight:      Height:        Intake/Output Summary (Last 24 hours) at 07/28/2021 2007 Last data filed at 07/28/2021 1700 Gross per 24 hour  Intake 717 ml  Output 2850 ml  Net -2133 ml   Filed Weights   07/20/21 0332 07/21/21 0429 07/22/21  0500  Weight: 130.8 kg 129.7 kg 127.5 kg    Examination:  General exam: Small, more interactive, pleasantly confused  HEENT: Overall blisters and swelling of lips and mucosa appears mildly better today.  He does have some blisters developing around his mouth as well. Respiratory system: Clear to auscultation. Respiratory effort normal. Cardiovascular system:RRR. No murmurs, rubs, gallops. Gastrointestinal system: Abdomen is nondistended, soft and nontender. No organomegaly or masses felt. Normal bowel sounds heard. Central nervous system: No focal neurological deficits.   Extremities: No C/C/E, +pedal pulses Skin: No rashes, lesions or ulcers Psychiatry: Awake, answers questions appropriately   Data Reviewed: I have personally reviewed following labs and imaging studies  CBC: Recent Labs  Lab 07/22/21 0431 07/23/21 0618 07/25/21 0851 07/26/21 0428 07/28/21 0544  WBC 8.1 6.9 6.7 6.3 4.3  HGB 10.4* 10.1* 10.2* 10.1* 10.4*  HCT 32.1* 30.9* 31.1* 32.0* 31.6*  MCV 108.4* 107.3* 106.5* 106.3* 105.7*  PLT 76* 100* 147* 165 [id number]   Basic Metabolic Panel: Recent Labs  Lab 07/22/21 0431 07/23/21 0618 07/24/21 0500 07/25/21 0851 07/26/21 0428 07/28/21 0544  NA 140 139 137 137 137 137  K 3.5 3.4* 3.5 3.3* 3.4* 3.6  CL 107 105 106 104 104 105  CO2 25 25 25 23  21* 25  GLUCOSE 84 95 94 93 87 103*  BUN 24* 23 19 12 9  7*  CREATININE 0.85 0.68 0.70 0.83 0.69 0.74  CALCIUM 8.3* 8.2* 8.2* 8.4* 8.5* 8.8*  MG 1.6* 1.8  --   --  1.9 1.8  PHOS 3.0  --   --   --  3.8 3.3   GFR: Estimated Creatinine Clearance: 114.6 mL/min (by C-G formula based on SCr of 0.74 mg/dL). Liver Function Tests: Recent Labs  Lab 07/22/21 0431 07/25/21 0851 07/28/21 0544  AST 53* 39  --   ALT 44 37  --   ALKPHOS 80 77  --   BILITOT 0.9 0.8  --   PROT 5.7* 6.3*  --   ALBUMIN 2.6* 3.0* 3.1*   No results for input(s): LIPASE, AMYLASE in the last 168 hours. Recent Labs  Lab 07/25/21 0851  AMMONIA 20    Coagulation Profile: No results for input(s): INR, PROTIME in the last 168 hours. Cardiac Enzymes: Recent Labs  Lab 07/26/21 0428  CKTOTAL 39*   BNP (last 3 results) No results for input(s): PROBNP in the last 8760 hours. HbA1C: No results for input(s): HGBA1C in the last 72 hours. CBG: Recent Labs  Lab 07/22/21 1621  GLUCAP 142*   Lipid Profile: No results for input(s): CHOL, HDL, LDLCALC, TRIG, CHOLHDL, LDLDIRECT in the last 72 hours.  Thyroid Function Tests: No results for input(s): TSH, T4TOTAL, FREET4, T3FREE, THYROIDAB in the last 72 hours. Anemia Panel: No results for input(s): VITAMINB12, FOLATE, FERRITIN, TIBC, IRON, RETICCTPCT in the last 72 hours.  Sepsis Labs: No results for input(s): PROCALCITON, LATICACIDVEN in the last 168 hours.  No results found for this or any previous visit (from the past 240 hour(s)).   Radiology Studies: No results found.   Scheduled Meds:  Chlorhexidine Gluconate Cloth  6 each Topical Daily   feeding supplement  237 mL Oral TID BM   levothyroxine  137 mcg Oral Q0600   lidocaine   Topical TID   LORazepam  0.5 mg Oral Daily   LORazepam  1 mg Oral QHS   magic mouthwash w/lidocaine  5 mL Oral TID   mouth rinse  15 mL Mouth Rinse BID   polyethylene glycol  17 g Oral Daily   Ensure Max Protein  11 oz Oral Daily   tamsulosin  0.4 mg Oral Daily   valACYclovir  1,000 mg Oral TID   Continuous Infusions:    LOS: 10 days    Roxan Hockey, MD [facility]  If 7PM-7AM, please contact night-coverage [email or web address] 07/28/2021, 8:07 PM

## 2021-07-28 NOTE — Progress Notes (Signed)
Occupational Therapy Treatment Patient Details Name: Patrick Kerr MRN: [id number] DOB: [birth year] Today's Date: 07/28/2021   History of present illness Patrick Kerr is a 75 y.o. male with medical history significant for hypertension, BPH with history of TURP, hypothyroidism who presents to the emergency department accompanied by daughter due to weakness.  Patient was unable to provide history, history was obtained from ED physician and daughter at bedside.  Per report, patient recently had circumcision in October 2022 due to recent development of phimosis, patient did complain of burning penile sensation prior to and after circumcision.  Daughter states that patient also complained of worsening burning sensation in the rectum when he has a bowel movement.  Outside screening colonoscopy done in October at bedside showed small polyp and some internal hemorrhoids, but patient denies any rectal bleeding.  He was prescribed with 7-day course of doxycycline which was followed with Macrobid for possible prostate infection, but there was no improvement in symptoms. Foley catheter was placed on 11/4 and this was removed on 12/5. Per daughter, PCP was seen about 4 days ago and blood work done was reported to be positive for hypokalemia and anemia.  Patient continued to show some weakness, so daughter decided to take patient to the ED for further evaluation and management.  There was no report of fever, chest pain, shortness of breath, headache   OT comments  Pt agreeable to OT/PT co-treatment with OT leaving session while pt working with PT. Pt able to complete bed mobility with Min A with HOB elevated. Pt able to complete step pivot transfer with RW and min to mod A + 2 on either side of pt during transfer. Pt completed B UE strengthening with frequent verbal and tactile cuing for form and technique. Pt demonstrates significant improvement from last transfer that OT was a part of. Pt left in chair working  with PT.    Recommendations for follow up therapy are one component of a multi-disciplinary discharge planning process, led by the attending physician.  Recommendations may be updated based on patient status, additional functional criteria and insurance authorization.    Follow Up Recommendations  Skilled nursing-short term rehab (<3 hours/day)    Assistance Recommended at Discharge Frequent or constant Supervision/Assistance  Equipment Recommendations  BSC/3in1          Precautions / Restrictions Precautions Precautions: Fall Restrictions Weight Bearing Restrictions: No       Mobility Bed Mobility Overal bed mobility: Needs Assistance Bed Mobility: Supine to Sit     Supine to sit: Min assist HOB elevated      General bed mobility comments: Increased time and labored movement. Pt needing single hand  held assist and use of railing.    Transfers Overall transfer level: Needs assistance Equipment used: Rolling walker (2 wheels) Transfers: Sit to/from Stand;Bed to chair/wheelchair/BSC Sit to Stand: Min assist;Mod assist;+2 physical assistance   Step pivot transfers: Min assist;Mod assist       General transfer comment: Verbal and tactile cuing to continue stepping to chair prior to sitting. Slow labored movement.     Balance Overall balance assessment: Needs assistance Sitting-balance support: Feet supported;No upper extremity supported Sitting balance-Leahy Scale: Good Sitting balance - Comments: seated at EOB   Standing balance support: Reliant on assistive device for balance;During functional activity;Bilateral upper extremity supported Standing balance-Leahy Scale: Fair Standing balance comment: fair/poor using RW  ADL either performed or assessed with clinical judgement     Vision   Additional Comments: Pt able to follow commands with visual modeling.   Perception     Praxis      Cognition Arousal/Alertness:  Awake/alert Behavior During Therapy: WFL for tasks assessed/performed Overall Cognitive Status: Within Functional Limits for tasks assessed                                            Exercises Exercises: General Upper Extremity General Exercises - Upper Extremity Shoulder Flexion: AROM;10 reps;Seated Shoulder ABduction: AROM;10 reps;Seated (x10 protraction.) Shoulder Horizontal ABduction: AROM;10 reps;Seated                Pertinent Vitals/ Pain       Pain Assessment: Faces Faces Pain Scale: No hurt                                                          Frequency  Min 2X/week        Progress Toward Goals  OT Goals(current goals can now be found in the care plan section)  Progress towards OT goals: Progressing toward goals  ADL Goals Pt Will Perform Eating: with supervision;bed level;sitting Pt Will Perform Grooming: sitting;with min assist Pt Will Perform Upper Body Bathing: sitting;with min assist;with adaptive equipment Pt Will Perform Lower Body Bathing: sitting/lateral leans;with mod assist;with adaptive equipment Pt Will Perform Upper Body Dressing: with min assist;sitting Pt Will Perform Lower Body Dressing: with mod assist;with adaptive equipment;sitting/lateral leans Pt Will Transfer to Toilet: with mod assist;stand pivot transfer;bedside commode Pt/caregiver will Perform Home Exercise Program: Increased ROM;Increased strength;Both right and left upper extremity;With minimal assist  Plan Discharge plan remains appropriate    Co-evaluation    PT/OT/SLP Co-Evaluation/Treatment: Yes Reason for Co-Treatment: To address functional/ADL transfers   OT goals addressed during session: ADL's and self-care                          End of Session Equipment Utilized During Treatment: Rolling walker (2 wheels)  OT Visit Diagnosis: Unsteadiness on feet (R26.81);Other abnormalities of gait and mobility  (R26.89);Muscle weakness (generalized) (M62.81)   Activity Tolerance Patient tolerated treatment well   Patient Left in chair;with call bell/phone within reach;Other (comment) (PT present)   Nurse Communication Other (comment) (Aware pt in chair with chair alarm.)        Time: [phone] OT Time Calculation (min): 14 min  Charges: OT General Charges $OT Visit: 1 Visit OT Treatments $Self Care/Home Management : 8-22 mins  Michole Lecuyer OT, MOT  Danie Chandler 07/28/2021, 1:04 PM

## 2021-07-29 DIAGNOSIS — R6510 Systemic inflammatory response syndrome (SIRS) of non-infectious origin without acute organ dysfunction: Secondary | ICD-10-CM

## 2021-07-29 DIAGNOSIS — B961 Klebsiella pneumoniae [K. pneumoniae] as the cause of diseases classified elsewhere: Secondary | ICD-10-CM

## 2021-07-29 DIAGNOSIS — N179 Acute kidney failure, unspecified: Secondary | ICD-10-CM | POA: Diagnosis present

## 2021-07-29 DIAGNOSIS — A4159 Other Gram-negative sepsis: Secondary | ICD-10-CM | POA: Diagnosis present

## 2021-07-29 DIAGNOSIS — B009 Herpesviral infection, unspecified: Secondary | ICD-10-CM | POA: Diagnosis present

## 2021-07-29 MED ORDER — TAMSULOSIN HCL 0.4 MG PO CAPS: 0.4000 mg | ORAL_CAPSULE | Freq: Every day | ORAL | 2 refills | Status: DC

## 2021-07-29 MED ORDER — VALACYCLOVIR HCL 1 G PO TABS
1000.0000 mg | ORAL_TABLET | Freq: Three times a day (TID) | ORAL | 0 refills | Status: AC
Start: 2021-07-29 — End: 2021-08-03

## 2021-07-29 MED ORDER — ENSURE ENLIVE PO LIQD
237.0000 mL | Freq: Three times a day (TID) | ORAL | 12 refills | Status: DC
Start: 2021-07-29 — End: 2022-09-28

## 2021-07-29 MED ORDER — PHENAZOPYRIDINE HCL 200 MG PO TABS: 200.0000 mg | ORAL_TABLET | Freq: Three times a day (TID) | ORAL | 0 refills | Status: DC

## 2021-07-29 MED ORDER — LIDOCAINE VISCOUS HCL 2 % MT SOLN: 10.0000 mL | OROMUCOSAL | 1 refills | Status: DC | PRN

## 2021-07-29 MED ORDER — POLYETHYLENE GLYCOL 3350 17 G PO PACK: 17.0000 g | PACK | Freq: Every day | ORAL | 2 refills | Status: AC

## 2021-07-29 MED ORDER — BLISTEX MEDICATED EX OINT
1.0000 | TOPICAL_OINTMENT | CUTANEOUS | 0 refills | Status: DC | PRN
Start: 2021-07-29 — End: 2021-08-23

## 2021-07-29 MED ORDER — ALBUTEROL SULFATE (2.5 MG/3ML) 0.083% IN NEBU: 2.5000 mg | INHALATION_SOLUTION | RESPIRATORY_TRACT | 2 refills | Status: DC | PRN

## 2021-07-29 MED ORDER — GUAIFENESIN ER 600 MG PO TB12: 600.0000 mg | ORAL_TABLET | Freq: Two times a day (BID) | ORAL | 0 refills | Status: AC

## 2021-07-29 NOTE — TOC Transition Note (Signed)
Transition of Care [facility]) - CM/SW Discharge Note   Patient Details  Name: Patrick Kerr MRN: [id number] Date of Birth: 20-Jan-1946  Transition of Care [facility]) CM/SW Contact:  Elliot Gault, LCSW Phone Number: 07/29/2021, 1:14 PM   Clinical Narrative:     Pt stable for dc home today per MD. Shirl Harris machine ordered from [employer]. Per RN, dtr at bedside and she plans to transport pt home. [facility] and [facility] follow up services. Updated [facility] of pt's dc.  There are no other TOC needs identified for dc.  Final next level of care: Home w Home Health Services Barriers to Discharge: Barriers Resolved   Patient Goals and CMS Choice Patient states their goals for this hospitalization and ongoing recovery are:: home with [facility] and private care givers      Discharge Placement                       Discharge Plan and Services     Post Acute Care Choice: Durable Medical Equipment, Home Health          DME Arranged: 3-N-1, Hospital bed, Wheelchair manual (Lift) DME Agency: [employer] Date DME Agency Contacted: 07/23/21 Time DME Agency Contacted: [phone] Representative spoke with at DME Agency: Morrie Sheldon HH Arranged: RN, PT, OT, Speech Therapy HH Agency: [facility] Date HH Agency Contacted: 07/23/21 Time HH Agency Contacted: [phone] Representative spoke with at [facility] Agency: Jerilynn Som  Social Determinants of Health (SDOH) Interventions     Readmission Risk Interventions No flowsheet data found.

## 2021-07-29 NOTE — Progress Notes (Signed)
Physical Therapy Treatment Patient Details Name: Patrick Kerr MRN: [id number] DOB: April 30, 1946 Today's Date: 07/29/2021   History of Present Illness Patrick Kerr is a 75 y.o. male with medical history significant for hypertension, BPH with history of TURP, hypothyroidism who presents to the emergency department accompanied by daughter due to weakness.  Patient was unable to provide history, history was obtained from ED physician and daughter at bedside.  Per report, patient recently had circumcision in October 2022 due to recent development of phimosis, patient did complain of burning penile sensation prior to and after circumcision.  Daughter states that patient also complained of worsening burning sensation in the rectum when he has a bowel movement.  Outside screening colonoscopy done in October at bedside showed small polyp and some internal hemorrhoids, but patient denies any rectal bleeding.  He was prescribed with 7-day course of doxycycline which was followed with Macrobid for possible prostate infection, but there was no improvement in symptoms. Foley catheter was placed on 11/4 and this was removed on 12/5. Per daughter, PCP was seen about 4 days ago and blood work done was reported to be positive for hypokalemia and anemia.  Patient continued to show some weakness, so daughter decided to take patient to the ED for further evaluation and management.  There was no report of fever, chest pain, shortness of breath, headache    PT Comments    Patient presents alert, c/o fatigue after limited sleep over night and agreeable for therapy - his daughter present at bedside.  Patient demonstrates slow labored movement for sitting up at bedside with difficulty completing supine to sitting with HOB flat, fair carryover for completing BLE exercises while seated at bedside and frequent leaning backwards and fell onto right side once fatigued.  Patient's daughter demonstrates fair/good return for  assisting patient for sit to stands and taking a few steps to transfer to chair during family training with understanding acknowledged.  Patient tolerated sitting up in chair after therapy.  Patient will benefit from continued skilled physical therapy in hospital and recommended venue below to increase strength, balance, endurance for safe ADLs and gait.      Recommendations for follow up therapy are one component of a multi-disciplinary discharge planning process, led by the attending physician.  Recommendations may be updated based on patient status, additional functional criteria and insurance authorization.  Follow Up Recommendations  Skilled nursing-short term rehab (<3 hours/day)     Assistance Recommended at Discharge Intermittent Supervision/Assistance  Equipment Recommendations  None recommended by PT    Recommendations for Other Services       Precautions / Restrictions Precautions Precautions: Fall Restrictions Weight Bearing Restrictions: No     Mobility  Bed Mobility Overal bed mobility: Needs Assistance Bed Mobility: Supine to Sit     Supine to sit: Mod assist;Min assist     General bed mobility comments: increased time, labored movement with HOB flat    Transfers Overall transfer level: Needs assistance Equipment used: Rolling walker (2 wheels) Transfers: Sit to/from Stand;Bed to chair/wheelchair/BSC Sit to Stand: Min assist;Mod assist     Step pivot transfers: Mod assist     General transfer comment: slow labored movement    Ambulation/Gait Ambulation/Gait assistance: Mod assist Gait Distance (Feet): 5 Feet Assistive device: Rolling walker (2 wheels) Gait Pattern/deviations: Decreased step length - right;Decreased step length - left;Decreased stride length;Shuffle Gait velocity: decreased     General Gait Details: limited to a few slow unsteady labored side steps with occasional  shuffling of feet due to BLE weakness   Stairs              Wheelchair Mobility    Modified Rankin (Stroke Patients Only)       Balance Overall balance assessment: Needs assistance Sitting-balance support: Feet supported;No upper extremity supported Sitting balance-Leahy Scale: Poor Sitting balance - Comments: fair/poor with occasional leaning backwards and falling over to the right once fatigued Postural control: Posterior lean;Right lateral lean Standing balance support: Reliant on assistive device for balance;During functional activity;Bilateral upper extremity supported Standing balance-Leahy Scale: Poor Standing balance comment: fair/poor using RW                            Cognition Arousal/Alertness: Awake/alert Behavior During Therapy: WFL for tasks assessed/performed Overall Cognitive Status: Within Functional Limits for tasks assessed                                          Exercises General Exercises - Lower Extremity Long Arc Quad: Seated;AROM;Strengthening;Both;10 reps Hip Flexion/Marching: Seated;AROM;Strengthening;Both;10 reps Toe Raises: Seated;AROM;Strengthening;Both;10 reps Heel Raises: Seated;AROM;Strengthening;Both;10 reps    General Comments        Pertinent Vitals/Pain Pain Assessment: No/denies pain    Home Living                          Prior Function            PT Goals (current goals can now be found in the care plan section) Acute Rehab PT Goals Patient Stated Goal: return home with family to assist PT Goal Formulation: With patient/family Time For Goal Achievement: 08/11/21 Potential to Achieve Goals: Good Progress towards PT goals: Progressing toward goals    Frequency    Min 3X/week      PT Plan Current plan remains appropriate    Co-evaluation              AM-PAC PT "6 Clicks" Mobility   Outcome Measure  Help needed turning from your back to your side while in a flat bed without using bedrails?: A Little Help needed moving  from lying on your back to sitting on the side of a flat bed without using bedrails?: A Lot Help needed moving to and from a bed to a chair (including a wheelchair)?: A Lot Help needed standing up from a chair using your arms (e.g., wheelchair or bedside chair)?: A Lot Help needed to walk in hospital room?: A Lot Help needed climbing 3-5 steps with a railing? : Total 6 Click Score: 12    End of Session Equipment Utilized During Treatment: Gait belt Activity Tolerance: Patient tolerated treatment well;Patient limited by fatigue Patient left: in chair;with call bell/phone within reach;with chair alarm set;with family/visitor present Nurse Communication: Mobility status PT Visit Diagnosis: Unsteadiness on feet (R26.81);Other abnormalities of gait and mobility (R26.89);Muscle weakness (generalized) (M62.81)     Time: [id number] PT Time Calculation (min) (ACUTE ONLY): 31 min  Charges:  $Therapeutic Exercise: 8-22 mins $Therapeutic Activity: 8-22 mins                     12:14 PM, 07/29/21 Ocie Bob, MPT Physical Therapist with [facility] [phone] office [phone] mobile phone

## 2021-07-29 NOTE — Discharge Summary (Signed)
Patrick Kerr, is a 75 y.o. male  DOB March 09, 1946  MRN [id number].  Admission date:  07/17/2021  Admitting Physician  Bernadette Hoit, DO  Discharge Date:  07/29/2021   Primary [place]  Recommendations for primary care physician for things to follow:  1)Avoid ibuprofen/Advil/Aleve/Motrin/Goody Powders/Naproxen/BC powders/Meloxicam/Diclofenac/Indomethacin and other Nonsteroidal anti-inflammatory medications as these will make you more likely to bleed and can cause stomach ulcers, can also cause Kidney problems.   2)Please follow-up with Urologist Dr. Alyson Ingles in about 1 to 2 weeks ---Keep Foley Catheter in until seen in office  for recheck and follow-up evaluation  in his office----[facility], [address], [place] [address] Phone Number----[phone]  3)follow up with primary care physician at the [facility] clinic for repeat CBC and BMP blood test in about a week or so   Admission Diagnosis  Encounter for intubation [Z01.818] Acute cystitis with hematuria [N30.01] Right ureteral stone [N20.1] Sepsis secondary to UTI ([place]) [A41.9, N39.0] Sepsis without acute organ dysfunction, due to unspecified organism [facility]) [A41.9]   Discharge Diagnosis  Encounter for intubation [Z01.818] Acute cystitis with hematuria [N30.01] Right ureteral stone [N20.1] Sepsis secondary to UTI ([place]) [A41.9, N39.0] Sepsis without acute organ dysfunction, due to unspecified organism ([place]) [A41.9]    Principal Problem:   Klebsiella sepsis/UTI with Septic Shock Active Problems:   Bladder outlet obstruction   Essential hypertension   BPH (benign prostatic hyperplasia)   Herpes simplex type 1--- Cold Sores   AKI (acute kidney injury) ([place])   Acquired hypothyroidism   Sepsis secondary to UTI ([place])   Macrocytic anemia   Hypokalemia   Thrombocytopenia ([facility])   Low TSH level   Lactic  acidosis   Obesity (BMI 30.0-34.9)   Pressure injury of skin      Past Medical History:  Diagnosis Date   Arthritis    Hypertension     Past Surgical History:  Procedure Laterality Date   CIRCUMCISION  05/25/2021   TRANSURETHRAL RESECTION OF PROSTATE       HPI  from the history and physical done on the day of admission:   Chief Complaint: Generalized weakness and decreased appetite   HPI: Patrick Kerr is a 75 y.o. male with medical history significant for hypertension, BPH with history of TURP, hypothyroidism who presents to the emergency department accompanied by daughter due to weakness.  Patient was unable to provide history, history was obtained from ED physician and daughter at bedside.  Per report, patient recently had circumcision in October 2022 due to recent development of phimosis, patient did complain of burning penile sensation prior to and after circumcision.  Daughter states that patient also complained of worsening burning sensation in the rectum when he has a bowel movement.  Outside screening colonoscopy done in October at bedside showed small polyp and some internal hemorrhoids, but patient denies any rectal bleeding.  He was prescribed with 7-day course of doxycycline which was followed with Macrobid for possible prostate infection, but there was no  improvement in symptoms. Foley catheter was placed on 11/4 and this was removed on 12/5. Per daughter, PCP was seen about 4 days ago and blood work done was reported to be positive for hypokalemia and anemia.  Patient continued to show some weakness, so daughter decided to take patient to the ED for further evaluation and management.  There was no report of fever, chest pain, shortness of breath, headache   ED Course:  In the emergency department, he was febrile, tachypneic, tachycardia, BP was 73/55.  Work-up in the ED showed macrocytic anemia, thrombocytopenia, hypokalemia, urinalysis was positive for UTI, lactic acid 5.7 >  4.4 > 2.9.  TSH 0.053.  Influenza A, B, SARS coronavirus 2 was negative. CT abdomen and pelvis showed no acute findings within the abdomen or pelvis Abdominal x-ray showed no acute findings within the abdominal pelvis Chest x-ray showed right-sided venous catheter positioning Hydration per sepsis protocol was provided, IV ceftriaxone was given, patient was on responsive to IV fluid, right-sided ICA central line was placed and patient was started on Levophed. PCCM at [facility] was consulted for possible transfer, but ED physician stated that he was told that patient could be managed here at [facility] and especially since there is no bed at [facility]. Patient complained of discomfort with tendency to urinate, patient has a chronic history of bladder outlet obstruction, bladder scan was done was noted to have a retention of 799 mL of urine.  In and out catheter was done in the afternoon (per RN) which resulted in bleeding (possibly traumatic catheterization).  Patient lost balance when attempt was made to transfer him from bed to bedside commode, this was followed with vomiting and worsening hypotensive episode.  It was then decided to intubate patient for protection of his airway.  He was started on IV Precedex and fentanyl drip for sedation. Hospitalist was asked to admit patient for further evaluation and management.     Hospital Course:      A/p Septic shock secondary to UTI-Klebsiella, present on admission -Briefly did require vasopressors, norepinephrine has been weaned off -Initially treated with IV ceftriaxone -He has been transitioned to [place] to have been adequately treated with antibiotics.  With ongoing issues with ulcerations on lips,  discontinued further antibiotics and observe -Blood cultures with no growth to date -Lactic acid has normalized   -Severe herpes simplex on the upper and lower lips --culture consistent with  HSV 1 -Case was reviewed with dermatology, Dr. 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 Alyson Ingles, urology who recommended to continue current catheter until he is able to follow-up with urology as an outpatient -Outpatient urology follow-up encouraged   Intractable nausea and vomiting with hypotension related to above factors -Patient intubated for airway protection -Patient extubated on 12/20 -Appreciate PCCM assistance -Tolerating oral intake in the further emesis   Acute metabolic encephalopathy -Initially felt to be related to medications given while patient was mechanically ventilated, also likely had component related to infectious process -mental status had improved to baseline -noted to have confusion and agitation on 12/24. He was also noted to be hyperreflexic  in lower extremities and had increased tone. Since patient is on large dose of sertraline, concern for developing serotonin syndrome -Sertraline and Abilify were discontinued -treated agitation and tremors with lorazepam -labs otherwise unrevealing -Overall mental status, hyperreflexia and rigidity have since improved--as per daughter who is at bedside pretty much back to baseline -Other possibilities in differential included benzodiazepine withdrawal, although daughter reports that he only takes 0.5 mg of Ativan twice daily.  Review of records indicate that he was off of Ativan for approximately 3 days during his hospital course, but during this time he was lethargic from residual effects of propofol. -Follow-up with PCP as  outpatient to discuss possible reinitiation of psychiatric medications -Psychiatric medications remain on hold at this time   Depression/PTSD -He is followed at the [place] -Since his sertraline and Abilify were discontinued due to possible serotonin syndrome Follow-up with PCP as outpatient to discuss possible reinitiation of psychiatric medications -Psychiatric medications remain on hold at this time   AKI -Mild with creatinine increasing to 1.3 -Now resolved -Suspect related to hemodynamic instabilities/sepsis   Hypokalemia -Repleted   Prolonged QTC -Avoid QT prolonging medications and supplement electrolytes -Psychiatric medications probably contributing   Thrombocytopenia -Suspect related to infectious process -Resolved   Essential hypertension -Blood pressures currently stable   Hypothyroidism -Continue Synthroid   BPH -On Flomax -Keep Foley in until seen by urologist as outpatient   Dysphagia -Seen by speech therapy and started on dysphagia 2 diet -Oral intake improved significantly   Generalized weakness -PT/OT -Daughter is wanting to take him home with home health aides -home equipment including hospital bed, wheelchair and Hoyer lift provided   pressure injury, medial coccyx, stage II, not present on admission Continue supportive management   Disposition--- Home with home health services     Code Status: Full Family Communication: Spoke with daughter at bedside    Consultants:  PCCM   Procedures:  12/18 intubation 12/20 extubation  Discharge Condition: Stable  Follow UP   Follow-up Information     Cleon Gustin, MD. Call in 2 days.   Specialty: Urology Contact information: [address] [place] [address] [phone]         Derek Jack, MD .   Specialty: Hematology Why: you are also being referred to Dr. Delton Coombes who can further evaluate your chronic anemia Contact information: [place]  [place] [address] [phone]                 Diet and Activity recommendation:  As advised  Discharge Instructions    Discharge Instructions     Ambulatory referral to Urology   Complete by: As directed    Distal ureteral stone x 3 months   Call MD for:  difficulty breathing, headache or visual disturbances   Complete by: As directed    Call MD for:  persistant dizziness or light-headedness   Complete by: As directed    Call MD for:  persistant nausea and vomiting   Complete by: As directed    Call MD for:  severe uncontrolled pain   Complete by: As directed    Call MD for:  temperature >100.4   Complete by: As directed    Diet - low sodium heart healthy   Complete by: As directed    Discharge instructions   Complete by: As directed    1)Avoid ibuprofen/Advil/Aleve/Motrin/Goody Powders/Naproxen/BC powders/Meloxicam/Diclofenac/Indomethacin and other Nonsteroidal anti-inflammatory medications as these will make you more likely to bleed and can  cause stomach ulcers, can also cause Kidney problems.   2)Please follow-up with Urologist Dr. Alyson Ingles in about 1 to 2 weeks ---Keep Foley Catheter in until seen in office  for recheck and follow-up evaluation  in his office----[facility], [address], [address], [place] [address] Phone Number----[phone]  3)follow up with primary care physician at the [facility] clinic for repeat CBC and BMP blood test in about a week or so   Increase activity slowly   Complete by: As directed         Discharge Medications     Allergies as of 07/29/2021       Reactions   Amitriptyline Other (See Comments)   Elavil makes him "hyper".   Azithromycin Other (See Comments)   Causes ulcers   Bactrim [sulfamethoxazole-trimethoprim] Other (See Comments)   Erythromycin Other (See Comments)   Pt states gave him an ulcer   Mirabegron Other (See Comments)   Headache   Prednisone Itching, Other (See Comments)   Numbness, pt states feet  felt like they were on fire   Hydromorphone Itching        Medication List     STOP taking these medications    ARIPiprazole 5 MG tablet Commonly known as: ABILIFY   DSS 100 MG Caps   ibuprofen 800 MG tablet Commonly known as: ADVIL   polyethylene glycol powder 17 GM/SCOOP powder Commonly known as: GLYCOLAX/MIRALAX Replaced by: polyethylene glycol 17 g packet   sertraline 100 MG tablet Commonly known as: ZOLOFT       TAKE these medications    Acetaminophen 325 MG Caps Take 2 capsules by mouth 2 (two) times daily.   albuterol (2.5 MG/3ML) 0.083% nebulizer solution Commonly known as: PROVENTIL Take 3 mLs (2.5 mg total) by nebulization every 4 (four) hours as needed for wheezing or shortness of breath.   cyanocobalamin 1000 MCG/ML injection Commonly known as: (VITAMIN B-12) Inject into the muscle.   Ergocalciferol 50 MCG (2000 UT) Tabs Take 1 tablet by mouth daily.   feeding supplement Liqd Take 237 mLs by mouth 3 (three) times daily between meals.   guaiFENesin 600 MG 12 hr tablet Commonly known as: Mucinex Take 1 tablet (600 mg total) by mouth 2 (two) times daily for 10 days.   levothyroxine 137 MCG tablet Commonly known as: SYNTHROID Take 137 mcg by mouth daily before breakfast.   lidocaine 2 % solution Commonly known as: XYLOCAINE Use as directed 10 mLs in the mouth or throat every 4 (four) hours as needed for mouth pain.   lip balm Oint Apply 1 application topically as needed for lip care.   LORazepam 1 MG tablet Commonly known as: ATIVAN Take 1 mg by mouth See admin instructions. Take 1/2 in the morning and 1/2 every evening can take up to 2 tablets   phenazopyridine 200 MG tablet Commonly known as: PYRIDIUM Take 1 tablet (200 mg total) by mouth 3 (three) times daily.   polyethylene glycol 17 g packet Commonly known as: MIRALAX / GLYCOLAX Take 17 g by mouth daily. Start taking on: July 30, 2021 Replaces: polyethylene glycol powder 17  GM/SCOOP powder   tamsulosin 0.4 MG Caps capsule Commonly known as: FLOMAX Take 1 capsule (0.4 mg total) by mouth daily. For Prostrate/Urination What changed: additional instructions   valACYclovir 1000 MG tablet Commonly known as: VALTREX Take 1 tablet (1,000 mg total) by mouth 3 (three) times daily for 5 days.  Durable Medical Equipment  (From admission, onward)           Start     Ordered   07/29/21 1155  For home use only DME Nebulizer machine  Once       Question Answer Comment  Patient needs a nebulizer to treat with the following condition Cough   Patient needs a nebulizer to treat with the following condition COPD (chronic obstructive pulmonary disease) ([place])   Length of Need Lifetime      07/29/21 1154   07/23/21 1919  For home use only DME 3 n 1  Once        07/23/21 1920   07/23/21 1917  For home use only DME lightweight manual wheelchair with seat cushion  Once       Comments: Patient suffers from deconditioning which impairs their ability to perform daily activities like ambulation in the home.  A walker will not resolve  issue with performing activities of daily living. A wheelchair will allow patient to safely perform daily activities. Patient is not able to propel themselves in the home using a standard weight wheelchair due to generalized weakness. Patient can self propel in the lightweight wheelchair. Length of need 6 months. Accessories: elevating leg rests (ELRs), wheel locks, extensions and anti-tippers.   07/23/21 1920   07/23/21 1916  For home use only DME Hospital bed  Once       Question Answer Comment  Length of Need 6 Months   The above medical condition requires: Patient requires the ability to reposition frequently   Head must be elevated greater than: 30 degrees   Bed type Semi-electric   Hoyer Lift Yes      07/23/21 1920   07/23/21 1916  For home use only DME Other see comment  Once       Comments: Harrel Lemon lift  Question:   Length of Need  Answer:  6 Months   07/23/21 1920            Major procedures and Radiology Reports - PLEASE review detailed and final reports for all details, in brief -    DG Chest 1 View  Result Date: 07/18/2021 CLINICAL DATA:  Short of breath. Intubated patient. Follow-up exam. EXAM: CHEST  1 VIEW COMPARISON:  07/18/2021 at 4:35 a.m. FINDINGS: Mild medial lung base opacities noted consistent with atelectasis. Suspect small effusions. Remainder of the lungs is clear. Endotracheal tube, nasal/orogastric tube and right internal jugular central venous line are stable and well positioned. No pneumothorax. IMPRESSION: 1. No change from the exam obtained earlier today. 2. Mild lung base opacities consistent atelectasis, likely with small effusions. No convincing pneumonia and no evidence of pulmonary edema. 3. Support apparatus is stable and well positioned. Electronically Signed   By: Lajean Manes M.D.   On: 07/18/2021 09:32   CT ABDOMEN PELVIS W CONTRAST  Result Date: 07/17/2021 CLINICAL DATA:  Chronic rectal and penile pain. Weight loss and fatigue. EXAM: CT ABDOMEN AND PELVIS WITH CONTRAST TECHNIQUE: Multidetector CT imaging of the abdomen and pelvis was performed using the standard protocol following bolus administration of intravenous contrast. CONTRAST:  141mL OMNIPAQUE IOHEXOL 300 MG/ML  SOLN COMPARISON:  04/23/2021 FINDINGS: Lower chest: No acute abnormality. Hepatobiliary: Liver normal in size. Subcentimeter low-density lesion, anterior aspect of the lateral segment of the left lobe, consistent with a cyst. No other liver masses or lesions. Normal gallbladder. No bile duct dilation. Pancreas: Unremarkable. No pancreatic ductal dilatation or surrounding inflammatory changes. Spleen: Normal  in size without focal abnormality. Adrenals/Urinary Tract: Small adrenal nodules, both 11 mm, stable. Kidneys normal in overall size, orientation and position. No renal masses. Small bilateral intrarenal  stones. No hydronephrosis. Stones noted in the distal right ureter at and just above the ureterovesicular junction, stable from the prior CT. This causes no signs of obstruction. Normal left ureter. Bladder wall is irregular with several cellule. No bladder mass or stone. Stomach/Bowel: Stomach is unremarkable. Small bowel and colon are normal in caliber. No wall thickening. No inflammation. Normal appendix visualized. Vascular/Lymphatic: Aortic atherosclerosis. No aneurysm. Scattered subcentimeter retroperitoneal lymph nodes. No enlarged lymph nodes. Reproductive: Findings consistent with a previous TURP. Densities within the prostate consistent with radiation therapy seeds. These findings are stable. Other: No abdominal wall hernia or abnormality. No abdominopelvic ascites. Musculoskeletal: No fracture or acute finding. No aggressive bone lesion. Previous posterior lumbar spine fusion, L3 through L5. Advanced degenerative changes throughout the visualized spine. IMPRESSION: 1. No acute findings within the abdomen or pelvis. 2. Stones in the distal right ureter without findings of obstruction, and unchanged compared to the prior CT. Small nonobstructing intrarenal stones. 3. 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 4. Aortic atherosclerosis. Electronically Signed   By: Lajean Manes M.D.   On: 07/17/2021 13:02   DG CHEST PORT 1 VIEW  Result Date: 07/25/2021 CLINICAL DATA:  Cough. EXAM: PORTABLE CHEST 1 VIEW COMPARISON:  07/20/2021 FINDINGS: 1247 hours. Interval extubation and NG tube removal. Right IJ central line seen previously has been removed in the interval. Low lung volumes with basilar atelectasis/infiltrate, left greater than right. Interstitial markings are diffusely coarsened with chronic features. Cardiopericardial silhouette is at upper limits of normal for size. Telemetry leads overlie the chest. IMPRESSION: 1. Interval extubation and NG tube  removal. 2. Low lung volumes with basilar atelectasis/infiltrate, left greater than right. Electronically Signed   By: Misty Stanley M.D.   On: 07/25/2021 13:03   DG Chest Port 1 View  Result Date: 07/20/2021 CLINICAL DATA:  Evaluate lungs EXAM: PORTABLE CHEST 1 VIEW COMPARISON:  Chest x-ray dated July 19, 2021 FINDINGS: Right IJ line, ET tube and enteric tube are unchanged in position. Increased bilateral linear opacities, likely due to worsening atelectasis. Probable small bilateral pleural effusions. No evidence of pneumothorax. IMPRESSION: Increased bilateral linear opacities, likely due to worsening atelectasis. Electronically Signed   By: Yetta Glassman M.D.   On: 07/20/2021 08:11   DG CHEST PORT 1 VIEW  Result Date: 07/19/2021 CLINICAL DATA:  ET tube. Pt on cooling mat due to febrile episodes. Hx HTN. Nonsmoker EXAM: PORTABLE CHEST - 1 VIEW COMPARISON:  the previous day's study FINDINGS: Good position of endotracheal tube 5 cm above carina, gastric tube at least far as the stomach, and right IJ central line to distal SVC as before. Persistent left retrocardiac consolidation/atelectasis. Relatively low lung volumes. Heart size and mediastinal contours are within normal limits. Aortic Atherosclerosis (ICD10-170.0). No effusion. Sternotomy wires. IMPRESSION: 1. Persistent left retrocardiac consolidation/atelectasis. 2. Support hardware stable in position. Electronically Signed   By: Lucrezia Europe M.D.   On: 07/19/2021 10:09   DG Chest Port 1 View  Result Date: 07/18/2021 CLINICAL DATA:  Central line placement. EXAM: PORTABLE CHEST 1 VIEW COMPARISON:  None. FINDINGS: A right-sided venous catheter is seen. Its distal tip is noted at the junction of the superior vena cava and right atrium. Low lung volumes are present with mild, diffuse, chronic appearing increased interstitial lung markings. There is no evidence  of acute infiltrate, pleural effusion or pneumothorax. Multiple sternal wires are  seen. The heart size and mediastinal contours are within normal limits. Multilevel degenerative changes are noted throughout the thoracic spine. IMPRESSION: 1. Right-sided venous catheter positioning, as described above. 2. Evidence of prior median sternotomy. Electronically Signed   By: Virgina Norfolk M.D.   On: 07/18/2021 00:51   DG Chest Port 1V same Day  Result Date: 07/18/2021 CLINICAL DATA:  Check intubation, OG tube placement. Respiratory failure. EXAM: PORTABLE CHEST 1 VIEW COMPARISON:  Portable chest earlier today at 12:36 a.m. FINDINGS: ETT is in place with tip 3.6 cm from the carina. Enteric tube has its tip in the distal body of stomach based on position of the proximal side hole with the tip not included in the exam. A right IJ central line is stable in position with tip just above the cavoatrial junction. There are intact median sternotomy sutures and mild cardiomegaly. No vascular congestion is seen. There is increased left infrahilar lower lobe opacity consistent with atelectasis, pneumonia or aspiration. No other focal lung infiltrate is seen. There are trace pleural effusions as seen previously. Thoracic spondylosis. IMPRESSION: 1. Support tubes are adequately inserted, stable positioning right IJ line. 2. Trace pleural effusions, with increased opacity in the left lower lobe consistent with atelectasis, pneumonia or aspiration. Electronically Signed   By: Telford Nab M.D.   On: 07/18/2021 04:58   DG ABD ACUTE 2+V W 1V CHEST  Result Date: 07/17/2021 CLINICAL DATA:  Weakness, constipation. EXAM: DG ABDOMEN ACUTE WITH 1 VIEW CHEST COMPARISON:  CT abdomen pelvis 04/23/2021 FINDINGS: There is no evidence of dilated bowel loops or free intraperitoneal air. Thin linear radiopaque calculi projecting in the medial left abdomen correspond to vascular calcifications seen on prior CT 04/23/2021. Surgical clips project at the low central pelvis from prior TURP. Bilateral pedicle screw and rod  fixation spans L3-L5 with intervertebral spacers. Severe degenerative disc disease at L1-L2 and L2-L3. Postoperative changes of median sternotomy. Heart size and mediastinal contours are within normal limits. No focal consolidation, pleural effusion, or pneumothorax. Subsegmental bibasilar atelectasis. IMPRESSION: Nonobstructive bowel gas pattern on abdominal radiographs. No acute cardiopulmonary abnormality. Electronically Signed   By: Ileana Roup M.D.   On: 07/17/2021 11:35    Micro Results    No results found for this or any previous visit (from the past 240 hour(s)).  Today   Subjective   Patrick Kerr today has no new complaints  No Nausea, Vomiting or Diarrhea No fever  Or chills  -Oral intake has improved significantly -Lip lesions have crusted, no new additional lesions, no new outbreaks       Patient has been seen and examined prior to discharge   Objective   Blood pressure (!) 119/59, pulse 98, temperature (!) 97.5 F (36.4 C), temperature source Oral, resp. rate (!) 21, height 6\' 4"  (1.93 m), weight 127.5 kg, SpO2 95 %.   Intake/Output Summary (Last 24 hours) at 07/29/2021 1353 Last data filed at 07/29/2021 0900 Gross per 24 hour  Intake 440 ml  Output 1800 ml  Net -1360 ml    Exam Gen:- Awake Alert, no acute distress  HEENT:- [place].AT, No sclera icterus --Lip lesions have crusted, no new additional lesions, no new outbreaks -No mouth or pharyngeal/throat or palate lesions Neck-Supple Neck,No JVD,.  Lungs-  CTAB , good air movement bilaterally = CV- S1, S2 normal, regular Abd-  +ve B.Sounds, Abd Soft, No tenderness,    Extremity/Skin:- No  edema,  good pulses Psych-affect is appropriate, oriented x3 Neuro-generalized weakness, no new focal deficits, no tremors    Data Review   CBC w Diff:  Lab Results  Component Value Date   WBC 4.3 07/28/2021   HGB 10.4 (L) 07/28/2021   HCT 31.6 (L) 07/28/2021   PLT 209 07/28/2021   LYMPHOPCT 20 07/17/2021   MONOPCT  9 07/17/2021   EOSPCT 6 07/17/2021   BASOPCT 1 07/17/2021    CMP:  Lab Results  Component Value Date   NA 137 07/28/2021   K 3.6 07/28/2021   CL 105 07/28/2021   CO2 25 07/28/2021   BUN 7 (L) 07/28/2021   CREATININE 0.74 07/28/2021   PROT 6.3 (L) 07/25/2021   ALBUMIN 3.1 (L) 07/28/2021   BILITOT 0.8 07/25/2021   ALKPHOS 77 07/25/2021   AST 39 07/25/2021   ALT 37 07/25/2021  .   Total Discharge time is about 33 minutes  Shon Hale M.D on 07/29/2021 at 1:53 PM  Go to [email or web address] -  for contact info  [facility] - Office  [phone]

## 2021-07-31 ENCOUNTER — Emergency Department (HOSPITAL_COMMUNITY): Payer: No Typology Code available for payment source

## 2021-07-31 ENCOUNTER — Other Ambulatory Visit: Payer: Self-pay

## 2021-07-31 ENCOUNTER — Encounter (HOSPITAL_COMMUNITY): Payer: Self-pay

## 2021-07-31 ENCOUNTER — Emergency Department (HOSPITAL_COMMUNITY)
Admission: EM | Admit: 2021-07-31 | Discharge: 2021-07-31 | Disposition: A | Discharge status: Home / Self Care | Payer: No Typology Code available for payment source | Attending: Emergency Medicine | Admitting: Emergency Medicine

## 2021-07-31 DIAGNOSIS — J441 Chronic obstructive pulmonary disease with (acute) exacerbation: Secondary | ICD-10-CM | POA: Diagnosis not present

## 2021-07-31 DIAGNOSIS — E039 Hypothyroidism, unspecified: Secondary | ICD-10-CM | POA: Insufficient documentation

## 2021-07-31 DIAGNOSIS — I10 Essential (primary) hypertension: Secondary | ICD-10-CM | POA: Insufficient documentation

## 2021-07-31 DIAGNOSIS — Z79899 Other long term (current) drug therapy: Secondary | ICD-10-CM | POA: Diagnosis not present

## 2021-07-31 DIAGNOSIS — R0602 Shortness of breath: Secondary | ICD-10-CM | POA: Diagnosis present

## 2021-07-31 NOTE — ED Triage Notes (Signed)
Pt arrived POV with daughter and wife. Daughter states pt was short of breath and she gave him a albuterol neb tx at 6am and wanted to bring pt in to be safe. SOB started around midnight per pt.

## 2021-07-31 NOTE — ED Provider Notes (Signed)
[facility] EMERGENCY DEPARTMENT Provider Note   CSN: [id number] Arrival date & time: 07/31/21  [id number]     History Chief Complaint  Patient presents with   Shortness of Breath    Patrick Kerr is a 75 y.o. male.  Patient brought in for some shortness of breath.  He was given albuterol nebulized treatment at home and has improved now.  He is supposed to use it every 4-6 hours but has only used it twice in the last 3 days  The history is provided by the patient and medical records. No language interpreter was used.  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   Klebsiella sepsis/UTI with Septic Shock 07/29/2021   AKI (acute kidney injury) ([place]) 07/29/2021   Pressure injury of skin 07/24/2021   Sepsis secondary to UTI ([place]) 07/18/2021   Macrocytic anemia 07/18/2021   Hypokalemia 07/18/2021   Thrombocytopenia ([place]) 07/18/2021   Bladder outlet obstruction 07/18/2021   Low TSH level 07/18/2021   Lactic acidosis 07/18/2021   Obesity (BMI 30.0-34.9) 07/18/2021   Essential hypertension 07/18/2021   BPH (benign prostatic hyperplasia) 07/18/2021   Acquired hypothyroidism 07/18/2021   Protrusion of cervical intervertebral disc 07/20/2017   Status post lumbar spinal fusion 07/20/2017    Past Surgical History:  Procedure Laterality Date   CIRCUMCISION  05/25/2021   TRANSURETHRAL RESECTION OF PROSTATE         No family history on file.  Social History   Tobacco Use   Smoking status: Never   Smokeless  tobacco: Never  Vaping Use   Vaping Use: Every day  Substance Use Topics   Alcohol use: No   Drug use: No    Home Medications Prior to Admission medications   Medication Sig Start Date End Date Taking? Authorizing Provider  Acetaminophen 325 MG CAPS Take 2 capsules by mouth 2 (two) times daily.    [provider]  albuterol (PROVENTIL) (2.5 MG/3ML) 0.083% nebulizer solution Take 3 mLs (2.5 mg total) by nebulization every 4 (four) hours as needed for wheezing or shortness of breath. 07/29/21 07/29/22  Roxan Hockey, MD  cyanocobalamin (,VITAMIN B-12,) 1000 MCG/ML injection Inject into the muscle.    [provider]  Ergocalciferol 50 MCG (2000 UT) TABS Take 1 tablet by mouth daily.    [provider]  feeding supplement (ENSURE ENLIVE / ENSURE PLUS) LIQD Take 237 mLs by mouth 3 (three) times daily between meals. 07/29/21   Roxan Hockey, MD  guaiFENesin (MUCINEX) 600 MG 12 hr tablet Take 1 tablet (600 mg total) by mouth 2 (two) times daily for 10 days. 07/29/21 08/08/21  Roxan Hockey, MD  levothyroxine (SYNTHROID, LEVOTHROID) 137 MCG tablet Take 137 mcg by mouth daily before breakfast.    [provider]  lidocaine (XYLOCAINE) 2 % solution Use as directed 10 mLs in the mouth or throat every 4 (four) hours as needed for mouth pain. 07/29/21   Roxan Hockey, MD  lip balm (BLISTEX) OINT Apply 1 application topically  as needed for lip care. 07/29/21   Shon Hale, MD  LORazepam (ATIVAN) 1 MG tablet Take 1 mg by mouth See admin instructions. Take 1/2 in the morning and 1/2 every evening can take up to 2 tablets    [provider]  phenazopyridine (PYRIDIUM) 200 MG tablet Take 1 tablet (200 mg total) by mouth 3 (three) times daily. 07/29/21   Shon Hale, MD  polyethylene glycol (MIRALAX / GLYCOLAX) 17 g packet Take 17 g by mouth daily. 07/30/21   Shon Hale, MD  tamsulosin (FLOMAX) 0.4 MG CAPS capsule Take 1 capsule (0.4 mg  total) by mouth daily. For Prostrate/Urination 07/29/21   Shon Hale, MD  valACYclovir (VALTREX) 1000 MG tablet Take 1 tablet (1,000 mg total) by mouth 3 (three) times daily for 5 days. 07/29/21 08/03/21  Shon Hale, MD    Allergies    Amitriptyline, Azithromycin, Bactrim [sulfamethoxazole-trimethoprim], Erythromycin, Mirabegron, Prednisone, and Hydromorphone  Review of Systems   Review of Systems  Constitutional:  Negative for appetite change and fatigue.  HENT:  Negative for congestion, ear discharge and sinus pressure.   Eyes:  Negative for discharge.  Respiratory:  Positive for shortness of breath and wheezing. Negative for cough.   Cardiovascular:  Negative for chest pain.  Gastrointestinal:  Negative for abdominal pain and diarrhea.  Genitourinary:  Negative for frequency and hematuria.  Musculoskeletal:  Negative for back pain.  Skin:  Negative for rash.  Neurological:  Negative for seizures and headaches.  Psychiatric/Behavioral:  Negative for hallucinations.    Physical Exam Updated Vital Signs BP (!) 138/58    Pulse 84    Temp 97.7 F (36.5 C) (Oral)    Resp 18    Ht 6\' 4"  (1.93 m)    Wt 127 kg    SpO2 96%    BMI 34.08 kg/m   Physical Exam Vitals and nursing note reviewed.  Constitutional:      Appearance: He is well-developed.  HENT:     Head: Normocephalic.     Nose: Nose normal.  Eyes:     General: No scleral icterus.    Conjunctiva/sclera: Conjunctivae normal.  Neck:     Thyroid: No thyromegaly.  Cardiovascular:     Rate and Rhythm: Normal rate and regular rhythm.     Heart sounds: No murmur heard.   No friction rub. No gallop.  Pulmonary:     Breath sounds: No stridor. Wheezing present. No rales.  Chest:     Chest wall: No tenderness.  Abdominal:     General: There is no distension.     Tenderness: There is no abdominal tenderness. There is no rebound.  Musculoskeletal:        General: Normal range of motion.     Cervical back: Neck supple.   Lymphadenopathy:     Cervical: No cervical adenopathy.  Skin:    Findings: No erythema or rash.  Neurological:     Mental Status: He is alert and oriented to person, place, and time.     Motor: No abnormal muscle tone.     Coordination: Coordination normal.  Psychiatric:        Behavior: Behavior normal.    ED Results / Procedures / Treatments   Labs (all labs ordered are listed, but only abnormal results are displayed) Labs Reviewed - No data to display  EKG None  Radiology DG Chest [facility] 1 View  Result Date: 07/31/2021 CLINICAL DATA:  Shortness of breath starting this morning EXAM: PORTABLE CHEST 1  VIEW COMPARISON:  07/25/2021 FINDINGS: Stable low volume chest with interstitial coarsening at the bases. Normal heart size and mediastinal contours. Prior median sternotomy. IMPRESSION: Stable low volume chest with indistinct opacity at the bases. Electronically Signed   By: Jorje Guild M.D.   On: 07/31/2021 10:32    Procedures Procedures   Medications Ordered in ED Medications - No data to display  ED Course  I have reviewed the triage vital signs and the nursing notes.  Pertinent labs & imaging results that were available during my care of the patient were reviewed by me and considered in my medical decision making (see chart for details).    MDM Rules/Calculators/A&P                         Patient with stable COPD.  He and his daughter has been instructed on how often to use albuterol inhaler.  He will follow-up as planned    Final Clinical Impression(s) / ED Diagnoses Final diagnoses:  COPD exacerbation [facility])    Rx / DC Orders ED Discharge Orders     None        Milton Ferguson, MD 08/03/21 1008

## 2021-07-31 NOTE — Discharge Instructions (Signed)
Use your albuterol inhaler every 4-6 hours as needed for breathing.  Follow-up as planned

## 2021-08-03 ENCOUNTER — Encounter: Payer: Self-pay | Admitting: Urology

## 2021-08-03 ENCOUNTER — Ambulatory Visit (INDEPENDENT_AMBULATORY_CARE_PROVIDER_SITE_OTHER): Payer: No Typology Code available for payment source | Admitting: Urology

## 2021-08-03 ENCOUNTER — Other Ambulatory Visit: Payer: Self-pay

## 2021-08-03 VITALS — BP 121/76 | HR 84

## 2021-08-03 DIAGNOSIS — N138 Other obstructive and reflux uropathy: Secondary | ICD-10-CM | POA: Diagnosis not present

## 2021-08-03 DIAGNOSIS — N390 Urinary tract infection, site not specified: Secondary | ICD-10-CM | POA: Diagnosis not present

## 2021-08-03 DIAGNOSIS — N401 Enlarged prostate with lower urinary tract symptoms: Secondary | ICD-10-CM | POA: Diagnosis not present

## 2021-08-03 DIAGNOSIS — N201 Calculus of ureter: Secondary | ICD-10-CM

## 2021-08-03 DIAGNOSIS — R338 Other retention of urine: Secondary | ICD-10-CM

## 2021-08-03 MED ORDER — FINASTERIDE 5 MG PO TABS: 5.0000 mg | ORAL_TABLET | Freq: Every day | ORAL | 3 refills | Status: DC

## 2021-08-03 NOTE — Progress Notes (Signed)
Urological Symptom Review  Patient is experiencing the following symptoms: Burning/pain with urination Get up at night to urinate Stream starts and stops Urinary tract infection Injury to kidneys/bladder   Review of Systems  Gastrointestinal (upper)  : Negative for upper GI symptoms  Gastrointestinal (lower) : Constipation  Constitutional : Weight loss  Skin: Negative for skin symptoms  Eyes: Negative for eye symptoms  Ear/Nose/Throat : Negative for Ear/Nose/Throat symptoms  Hematologic/Lymphatic: Negative for Hematologic/Lymphatic symptoms  Cardiovascular : Negative for cardiovascular symptoms  Respiratory : Negative for respiratory symptoms  Endocrine: Negative for endocrine symptoms  Musculoskeletal: Back pain  Neurological: Negative for neurological symptoms  Psychologic: Negative for psychiatric symptoms

## 2021-08-03 NOTE — Progress Notes (Signed)
H&P  Chief Complaint: Follow-up of UTI/ureteral calculi  History of Present Illness: 76 year old male presents for follow-up of urinary tract infections/sepsis as well as CT evidence of distal ureteral calculi on CT scan at the time of emergency room presentation.   1. No acute findings within the abdomen or pelvis. 2. Stones in the distal right ureter without findings of obstruction, and unchanged compared to the prior CT. Small nonobstructing intrarenal stones. 3. 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 4. Aortic atherosclerosis.  His urologic history is extensive, and most of his urologic intervention has been in [place], [place].  He apparently was treated for a right distal ureteral stone with shockwave lithotripsy in approximately January 2021.  The stone never passed.  He has not had repeat treatment.  Back about 3 to 4 years ago he had UroLift by a physician in [place], [place].  This did not improve his urinary symptomatology.  He had a circumcision in the fall 2022.  Following that, he went into urinary retention and basically has had a catheter since that time.  He is on tamsulosin.  He is not on finasteride.  The patient and his daughter want further urologic management here in [place].  He does have what sounds like fairly frequent bladder spasms.  He is having no gross hematuria.  He is having no flank pain.  He is at home now, following his hospitalization for sepsis.  He is quite debilitated and getting physical therapy.  He can walk perhaps 10 steps with assistance.    Past Medical History:  Diagnosis Date   Arthritis    Hypertension     Past Surgical History:  Procedure Laterality Date   CIRCUMCISION  05/25/2021   TRANSURETHRAL RESECTION OF PROSTATE      Home Medications:  Allergies as of 08/03/2021       Reactions   Amitriptyline Other (See Comments)   Elavil makes him "hyper".   Azithromycin Other  (See Comments)   Causes ulcers   Bactrim [sulfamethoxazole-trimethoprim] Other (See Comments)   Erythromycin Other (See Comments)   Pt states gave him an ulcer   Mirabegron Other (See Comments)   Headache   Prednisone Itching, Other (See Comments)   Numbness, pt states feet felt like they were on fire   Hydromorphone Itching        Medication List        Accurate as of August 03, 2021  1:10 PM. If you have any questions, ask your nurse or doctor.          Acetaminophen 325 MG Caps Take 2 capsules by mouth 2 (two) times daily.   albuterol (2.5 MG/3ML) 0.083% nebulizer solution Commonly known as: PROVENTIL Take 3 mLs (2.5 mg total) by nebulization every 4 (four) hours as needed for wheezing or shortness of breath.   cyanocobalamin 1000 MCG/ML injection Commonly known as: (VITAMIN B-12) Inject into the muscle.   Ergocalciferol 50 MCG (2000 UT) Tabs Take 1 tablet by mouth daily.   feeding supplement Liqd Take 237 mLs by mouth 3 (three) times daily between meals.   guaiFENesin 600 MG 12 hr tablet Commonly known as: Mucinex Take 1 tablet (600 mg total) by mouth 2 (two) times daily for 10 days.   levothyroxine 137 MCG tablet Commonly known as: SYNTHROID Take 137 mcg by mouth daily before breakfast.   lidocaine 2 % solution Commonly known as: XYLOCAINE Use as directed 10 mLs in the mouth or throat every 4 (  four) hours as needed for mouth pain.   lip balm Oint Apply 1 application topically as needed for lip care.   LORazepam 1 MG tablet Commonly known as: ATIVAN Take 1 mg by mouth See admin instructions. Take 1/2 in the morning and 1/2 every evening can take up to 2 tablets   phenazopyridine 200 MG tablet Commonly known as: PYRIDIUM Take 1 tablet (200 mg total) by mouth 3 (three) times daily.   polyethylene glycol 17 g packet Commonly known as: MIRALAX / GLYCOLAX Take 17 g by mouth daily.   tamsulosin 0.4 MG Caps capsule Commonly known as: FLOMAX Take 1  capsule (0.4 mg total) by mouth daily. For Prostrate/Urination   valACYclovir 1000 MG tablet Commonly known as: VALTREX Take 1 tablet (1,000 mg total) by mouth 3 (three) times daily for 5 days.        Allergies:  Allergies  Allergen Reactions   Amitriptyline Other (See Comments)    Elavil makes him "hyper".   Azithromycin Other (See Comments)    Causes ulcers   Bactrim [Sulfamethoxazole-Trimethoprim] Other (See Comments)   Erythromycin Other (See Comments)    Pt states gave him an ulcer   Mirabegron Other (See Comments)    Headache   Prednisone Itching and Other (See Comments)    Numbness, pt states feet felt like they were on fire   Hydromorphone Itching    No family history on file.  Social History:  reports that he has never smoked. He has never used smokeless tobacco. He reports that he does not drink alcohol and does not use drugs.  ROS: A complete review of systems was performed.  All systems are negative except for pertinent findings as noted.  Physical Exam:  Vital signs in last 24 hours: There were no vitals taken for this visit. Constitutional:  Alert and oriented, No acute distress.  He is in a wheelchair and looks fairly debilitated. Cardiovascular: Regular rate  Respiratory: Normal respiratory effort Rectal: Prostate perhaps 40 g, symmetric, nonnodular, nontender.  No rectal masses. Lymphatic: No lymphadenopathy Neurologic: Grossly intact, no focal deficits Psychiatric: Normal mood and affect  I have reviewed prior pt notes  I have reviewed notes from referring/previous physicians-Hospital records reviewed  I have independently reviewed prior imaging-CT images reviewed.  He has no right hydro but he does have a moderate size right distal ureteral stone.  I have reviewed prior PSA results  I have reviewed prior urine culture   Impression/Assessment:  1.  Urinary retention with history of BPH.  The patient is on tamsulosin.  He is quite debilitated  from his recent hospitalization for sepsis and is not a candidate for TOB at this time  2.  History of BPH, on tamsulosin, failed UroLift that was done 3 to 4 years ago  3.  Right ureteral stone, distal.  Failed treatment in early 2022.  No evidence of hydronephrosis at this time  Plan:  1.  For now, we will leave the catheter in.  I told the daughter that the patient can be temporarily taken off the tamsulosin.  I did add finasteride  2.  I will have him come back in in a couple of weeks to see Sharee Pimple, our PA.  Catheter change at that time as well as checking his overall medical status  3.  At this time, I do not think we need to urgently manage his right distal ureteral stone as he did not have hydronephrosis on CT scan from mid December.  However, I think he will eventually need ureteroscopic management  4.  Seeing that he has failed UroLift and he has had intermittent retention, when he does get stronger, I think he should have TURP at the same time as ureteroscopic stone management

## 2021-08-04 ENCOUNTER — Other Ambulatory Visit: Payer: Self-pay

## 2021-08-04 ENCOUNTER — Emergency Department (HOSPITAL_COMMUNITY)
Admission: EM | Admit: 2021-08-04 | Discharge: 2021-08-04 | Disposition: A | Discharge status: Home / Self Care | Payer: No Typology Code available for payment source | Attending: Emergency Medicine | Admitting: Emergency Medicine

## 2021-08-04 ENCOUNTER — Emergency Department (HOSPITAL_COMMUNITY): Payer: No Typology Code available for payment source

## 2021-08-04 ENCOUNTER — Encounter (HOSPITAL_COMMUNITY): Payer: Self-pay

## 2021-08-04 DIAGNOSIS — R34 Anuria and oliguria: Secondary | ICD-10-CM | POA: Diagnosis not present

## 2021-08-04 DIAGNOSIS — E079 Disorder of thyroid, unspecified: Secondary | ICD-10-CM | POA: Insufficient documentation

## 2021-08-04 DIAGNOSIS — M47897 Other spondylosis, lumbosacral region: Secondary | ICD-10-CM | POA: Insufficient documentation

## 2021-08-04 DIAGNOSIS — L738 Other specified follicular disorders: Secondary | ICD-10-CM | POA: Insufficient documentation

## 2021-08-04 DIAGNOSIS — Z9181 History of falling: Secondary | ICD-10-CM | POA: Insufficient documentation

## 2021-08-04 DIAGNOSIS — M5430 Sciatica, unspecified side: Secondary | ICD-10-CM | POA: Insufficient documentation

## 2021-08-04 DIAGNOSIS — E890 Postprocedural hypothyroidism: Secondary | ICD-10-CM | POA: Insufficient documentation

## 2021-08-04 DIAGNOSIS — F4312 Post-traumatic stress disorder, chronic: Secondary | ICD-10-CM | POA: Insufficient documentation

## 2021-08-04 DIAGNOSIS — L509 Urticaria, unspecified: Secondary | ICD-10-CM | POA: Insufficient documentation

## 2021-08-04 DIAGNOSIS — R454 Irritability and anger: Secondary | ICD-10-CM | POA: Insufficient documentation

## 2021-08-04 DIAGNOSIS — Z0189 Encounter for other specified special examinations: Secondary | ICD-10-CM | POA: Insufficient documentation

## 2021-08-04 DIAGNOSIS — Z20822 Contact with and (suspected) exposure to covid-19: Secondary | ICD-10-CM | POA: Insufficient documentation

## 2021-08-04 DIAGNOSIS — R21 Rash and other nonspecific skin eruption: Secondary | ICD-10-CM | POA: Insufficient documentation

## 2021-08-04 DIAGNOSIS — I872 Venous insufficiency (chronic) (peripheral): Secondary | ICD-10-CM | POA: Insufficient documentation

## 2021-08-04 DIAGNOSIS — E876 Hypokalemia: Secondary | ICD-10-CM | POA: Diagnosis not present

## 2021-08-04 DIAGNOSIS — R4182 Altered mental status, unspecified: Secondary | ICD-10-CM | POA: Insufficient documentation

## 2021-08-04 DIAGNOSIS — E785 Hyperlipidemia, unspecified: Secondary | ICD-10-CM | POA: Insufficient documentation

## 2021-08-04 DIAGNOSIS — E039 Hypothyroidism, unspecified: Secondary | ICD-10-CM | POA: Insufficient documentation

## 2021-08-04 DIAGNOSIS — D599 Acquired hemolytic anemia, unspecified: Secondary | ICD-10-CM | POA: Insufficient documentation

## 2021-08-04 DIAGNOSIS — B354 Tinea corporis: Secondary | ICD-10-CM | POA: Insufficient documentation

## 2021-08-04 DIAGNOSIS — L821 Other seborrheic keratosis: Secondary | ICD-10-CM | POA: Insufficient documentation

## 2021-08-04 DIAGNOSIS — R419 Unspecified symptoms and signs involving cognitive functions and awareness: Secondary | ICD-10-CM | POA: Insufficient documentation

## 2021-08-04 DIAGNOSIS — J069 Acute upper respiratory infection, unspecified: Secondary | ICD-10-CM | POA: Insufficient documentation

## 2021-08-04 DIAGNOSIS — R531 Weakness: Secondary | ICD-10-CM | POA: Insufficient documentation

## 2021-08-04 DIAGNOSIS — D649 Anemia, unspecified: Secondary | ICD-10-CM | POA: Insufficient documentation

## 2021-08-04 DIAGNOSIS — E669 Obesity, unspecified: Secondary | ICD-10-CM | POA: Insufficient documentation

## 2021-08-04 DIAGNOSIS — R300 Dysuria: Secondary | ICD-10-CM | POA: Insufficient documentation

## 2021-08-04 DIAGNOSIS — I10 Essential (primary) hypertension: Secondary | ICD-10-CM | POA: Insufficient documentation

## 2021-08-04 DIAGNOSIS — N400 Enlarged prostate without lower urinary tract symptoms: Secondary | ICD-10-CM | POA: Insufficient documentation

## 2021-08-04 DIAGNOSIS — N201 Calculus of ureter: Secondary | ICD-10-CM | POA: Insufficient documentation

## 2021-08-04 DIAGNOSIS — G8929 Other chronic pain: Secondary | ICD-10-CM | POA: Insufficient documentation

## 2021-08-04 DIAGNOSIS — R54 Age-related physical debility: Secondary | ICD-10-CM | POA: Insufficient documentation

## 2021-08-04 DIAGNOSIS — Z7189 Other specified counseling: Secondary | ICD-10-CM | POA: Insufficient documentation

## 2021-08-04 DIAGNOSIS — N529 Male erectile dysfunction, unspecified: Secondary | ICD-10-CM | POA: Insufficient documentation

## 2021-08-04 DIAGNOSIS — M47817 Spondylosis without myelopathy or radiculopathy, lumbosacral region: Secondary | ICD-10-CM | POA: Insufficient documentation

## 2021-08-04 DIAGNOSIS — F419 Anxiety disorder, unspecified: Secondary | ICD-10-CM | POA: Insufficient documentation

## 2021-08-04 DIAGNOSIS — F09 Unspecified mental disorder due to known physiological condition: Secondary | ICD-10-CM | POA: Insufficient documentation

## 2021-08-04 DIAGNOSIS — Z710 Person encountering health services to consult on behalf of another person: Secondary | ICD-10-CM | POA: Insufficient documentation

## 2021-08-04 DIAGNOSIS — N471 Phimosis: Secondary | ICD-10-CM | POA: Insufficient documentation

## 2021-08-04 DIAGNOSIS — E538 Deficiency of other specified B group vitamins: Secondary | ICD-10-CM | POA: Insufficient documentation

## 2021-08-04 DIAGNOSIS — E559 Vitamin D deficiency, unspecified: Secondary | ICD-10-CM | POA: Insufficient documentation

## 2021-08-04 LAB — URINALYSIS, ROUTINE W REFLEX MICROSCOPIC
Bilirubin Urine: NEGATIVE
Glucose, UA: NEGATIVE mg/dL
Ketones, ur: NEGATIVE mg/dL
Nitrite: NEGATIVE
Protein, ur: NEGATIVE mg/dL
Specific Gravity, Urine: 1.01 (ref 1.005–1.030)
pH: 6 (ref 5.0–8.0)

## 2021-08-04 LAB — COMPREHENSIVE METABOLIC PANEL
ALT: 21 U/L (ref 0–44)
AST: 31 U/L (ref 15–41)
Albumin: 3.7 g/dL (ref 3.5–5.0)
Alkaline Phosphatase: 61 U/L (ref 38–126)
Anion gap: 10 (ref 5–15)
BUN: 11 mg/dL (ref 8–23)
CO2: 24 mmol/L (ref 22–32)
Calcium: 8.8 mg/dL — ABNORMAL LOW (ref 8.9–10.3)
Chloride: 101 mmol/L (ref 98–111)
Creatinine, Ser: 0.87 mg/dL (ref 0.61–1.24)
GFR, Estimated: >60 mL/min (ref >60)
Glucose, Bld: 112 mg/dL — ABNORMAL HIGH (ref 70–99)
Potassium: 3.2 mmol/L — ABNORMAL LOW (ref 3.5–5.1)
Sodium: 135 mmol/L (ref 135–145)
Total Bilirubin: 0.5 mg/dL (ref 0.3–1.2)
Total Protein: 7.8 g/dL (ref 6.5–8.1)

## 2021-08-04 LAB — CBC WITH DIFFERENTIAL/PLATELET
Abs Immature Granulocytes: 0 10*3/uL (ref 0.00–0.07)
Basophils Absolute: 0.1 10*3/uL (ref 0.0–0.1)
Basophils Relative: 2 %
Eosinophils Absolute: 0.2 10*3/uL (ref 0.0–0.5)
Eosinophils Relative: 3 %
HCT: 32.9 % — ABNORMAL LOW (ref 39.0–52.0)
Hemoglobin: 10.6 g/dL — ABNORMAL LOW (ref 13.0–17.0)
Immature Granulocytes: 0 %
Lymphocytes Relative: 31 %
Lymphs Abs: 1.4 10*3/uL (ref 0.7–4.0)
MCH: 34.6 pg — ABNORMAL HIGH (ref 26.0–34.0)
MCHC: 32.2 g/dL (ref 30.0–36.0)
MCV: 107.5 fL — ABNORMAL HIGH (ref 80.0–100.0)
Monocytes Absolute: 0.5 10*3/uL (ref 0.1–1.0)
Monocytes Relative: 11 %
Neutro Abs: 2.5 10*3/uL (ref 1.7–7.7)
Neutrophils Relative %: 53 %
Platelets: 270 10*3/uL (ref 150–400)
RBC: 3.06 MIL/uL — ABNORMAL LOW (ref 4.22–5.81)
RDW: 12.4 % (ref 11.5–15.5)
WBC: 4.6 10*3/uL (ref 4.0–10.5)
nRBC: 0 % (ref 0.0–0.2)

## 2021-08-04 LAB — LACTIC ACID, PLASMA: Lactic Acid, Venous: 1.7 mmol/L (ref 0.5–1.9)

## 2021-08-04 LAB — RESP PANEL BY RT-PCR (FLU A&B, COVID) ARPGX2
Influenza A by PCR: NEGATIVE
Influenza B by PCR: NEGATIVE
SARS Coronavirus 2 by RT PCR: NEGATIVE

## 2021-08-04 LAB — AMMONIA: Ammonia: 12 umol/L (ref 9–35)

## 2021-08-04 LAB — CBG MONITORING, ED: Glucose-Capillary: 105 mg/dL — ABNORMAL HIGH (ref 70–99)

## 2021-08-04 MED ORDER — QUETIAPINE FUMARATE 25 MG PO TABS
25.0000 mg | ORAL_TABLET | Freq: Every day | ORAL | Status: DC
  Administered 2021-08-04: 25 mg via ORAL
  Filled 2021-08-04: qty 1

## 2021-08-04 MED ORDER — SODIUM CHLORIDE 0.9 % IV BOLUS
1000.0000 mL | Freq: Once | INTRAVENOUS | Status: AC
  Administered 2021-08-04: 1000 mL via INTRAVENOUS

## 2021-08-04 MED ORDER — QUETIAPINE FUMARATE 25 MG PO TABS: 25.0000 mg | ORAL_TABLET | Freq: Every day | ORAL | 0 refills | Status: DC

## 2021-08-04 NOTE — Consult Note (Signed)
Patient Demographics  Patrick Kerr, is a 76 y.o. male   MRN: [id number]   DOB - 1946/01/15  Admit Date - 08/04/2021    Outpatient Primary MD for the patient is Sterling requested in the Hospital by Isla Pence, MD, On 08/04/2021    Reason for consult : Altered mental status>> irritability   With History of -  Past Medical History:  Diagnosis Date   Arthritis    Hypertension       Past Surgical History:  Procedure Laterality Date   CIRCUMCISION  05/25/2021   TRANSURETHRAL RESECTION OF PROSTATE      in for   Chief Complaint  Patient presents with   Weakness     HPI  Patrick Kerr  is a 76 y.o. male, with past medical history of dementia, bladder outlet obstruction, PAH, Foley which is managed closely by urologist, BPH status post TURP, hypothyroidism, patient with recent prolonged hospitalization due to septicemia from Klebsiella UTI, required intubation Foley dependent,, as well questionable of serotonin syndrome where his Abilify and Zoloft has been discontinued, patient was discharged home, he was brought by his daughter for progressive weakness, and irritability, daughter reports patient with history of dementia, he has home PT/OT as well, but reports she was noted him to be more irritable recently, does not follow her commands, and he wants to have it his way, he was recommended to discontinue Abilify and Zoloft on discharge for possible serotonin syndrome, but he was resumed on on it at home, as daughter report he was restless, and irritable, other than that daughter denies him having any nausea, vomiting, falls, no fever or chills, no focal deficits, no syncope.   Review of Systems    Patient unable to provide any review of systems due to his dementia  Social History Social History   Tobacco Use   Smoking status: Never   Smokeless tobacco: Never   Substance Use Topics   Alcohol use: No    Family History History reviewed. No pertinent family history.   Prior to Admission medications   Medication Sig Start Date End Date Taking? Authorizing Provider  Acetaminophen 325 MG CAPS Take 2 capsules by mouth 2 (two) times daily.   Yes [provider]  albuterol (PROVENTIL) (2.5 MG/3ML) 0.083% nebulizer solution Take 3 mLs (2.5 mg total) by nebulization every 4 (four) hours as needed for wheezing or shortness of breath. 07/29/21 07/29/22 Yes Emokpae, Courage, MD  cyanocobalamin (,VITAMIN B-12,) 1000 MCG/ML injection Inject into the muscle.   Yes [provider]  Docusate Sodium (DSS) 100 MG CAPS Take 1 capsule by mouth 2 (two) times daily. 07/15/21  Yes [provider]  feeding supplement (ENSURE ENLIVE / ENSURE PLUS) LIQD Take 237 mLs by mouth 3 (three) times daily between meals. 07/29/21  Yes Emokpae, Courage, MD  hydrocortisone cream 1 % Apply 1 application topically 2 (two) times daily.   Yes [provider]  levothyroxine (SYNTHROID, LEVOTHROID) 137 MCG tablet Take 137 mcg  by mouth daily before breakfast.   Yes [provider]  lidocaine (XYLOCAINE) 2 % solution Use as directed 10 mLs in the mouth or throat every 4 (four) hours as needed for mouth pain. 07/29/21  Yes Emokpae, Courage, MD  LORazepam (ATIVAN) 1 MG tablet Take 1 mg by mouth See admin instructions. Take 1/2 in the morning and 1/2 every evening can take up to 2 tablets   Yes [provider]  Joshua   Yes [provider]  polyethylene glycol (MIRALAX / GLYCOLAX) 17 g packet Take 17 g by mouth daily. 07/30/21  Yes Emokpae, Courage, MD  QUEtiapine (SEROQUEL) 25 MG tablet Take 1 tablet (25 mg total) by mouth at bedtime. 08/04/21  Yes Jakyiah Briones, Silver Huguenin, MD  Ergocalciferol 50 MCG (2000 UT) TABS Take 1 tablet by mouth daily. Patient not taking: Reported on 08/04/2021    [provider]   finasteride (PROSCAR) 5 MG tablet Take 1 tablet (5 mg total) by mouth daily. 08/03/21   Franchot Gallo, MD  guaiFENesin (MUCINEX) 600 MG 12 hr tablet Take 1 tablet (600 mg total) by mouth 2 (two) times daily for 10 days. Patient not taking: Reported on 08/04/2021 07/29/21 08/08/21  Roxan Hockey, MD  lip balm (BLISTEX) OINT Apply 1 application topically as needed for lip care. Patient not taking: Reported on 08/04/2021 07/29/21   Roxan Hockey, MD  phenazopyridine (PYRIDIUM) 200 MG tablet Take 1 tablet (200 mg total) by mouth 3 (three) times daily. Patient not taking: Reported on 08/04/2021 07/29/21   Roxan Hockey, MD  tamsulosin (FLOMAX) 0.4 MG CAPS capsule Take 1 capsule (0.4 mg total) by mouth daily. For Prostrate/Urination Patient not taking: Reported on 08/04/2021 07/29/21   Roxan Hockey, MD    Anti-infectives (From admission, onward)    None       Scheduled Meds:  QUEtiapine  25 mg Oral QHS   Continuous Infusions: PRN Meds:.  Allergies  Allergen Reactions   Sulfamethoxazole-Trimethoprim Other (See Comments) and Rash   Amitriptyline Other (See Comments)    Elavil makes him "hyper".   Azithromycin Other (See Comments)    Causes ulcers   Erythromycin Other (See Comments)    Pt states gave him an ulcer   Erythromycin Base    Mirabegron Other (See Comments)    Headache   Prednisone Itching and Other (See Comments)    Numbness, pt states feet felt like they were on fire   Hydromorphone Itching    Physical Exam  Vitals  Blood pressure (!) 152/82, pulse 81, temperature 99.1 F (37.3 C), temperature source Rectal, resp. rate (!) 25, height 6\' 4"  (1.93 m), weight 117.9 kg, SpO2 93 %.   1. General frail, chronically ill-appearing male, laying in bed in no apparent distress  2.  Demented, but follow commands, impaired cognition and insight  3. No F.N deficits, ALL C.Nerves Intact, Strength 5/5 all 4 extremities, Sensation intact all 4 extremities, Plantars down  going.  4. Ears and Eyes appear Normal, Conjunctivae clear, PERRLA. Moist Oral Mucosa.  5. Supple Neck, No JVD, No cervical lymphadenopathy appriciated, No Carotid Bruits.  6. Symmetrical Chest wall movement, Good air movement bilaterally, CTAB.  7. RRR, No Gallops, Rubs or Murmurs, No Parasternal Heave.  8. Positive Bowel Sounds, Abdomen Soft, No tenderness, No organomegaly appriciated,No rebound -guarding or rigidity.  9.  No Cyanosis, Normal Skin Turgor, No Skin Rash or Bruise.  10. Good muscle tone,  joints appear normal , no effusions, Normal  ROM.  11. No Palpable Lymph Nodes in Neck or Axillae    Data Review  CBC Recent Labs  Lab 08/04/21 1532  WBC 4.6  HGB 10.6*  HCT 32.9*  PLT 270  MCV 107.5*  MCH 34.6*  MCHC 32.2  RDW 12.4  LYMPHSABS 1.4  MONOABS 0.5  EOSABS 0.2  BASOSABS 0.1   ------------------------------------------------------------------------------------------------------------------  Chemistries  Recent Labs  Lab 08/04/21 1532  NA 135  K 3.2*  CL 101  CO2 24  GLUCOSE 112*  BUN 11  CREATININE 0.87  CALCIUM 8.8*  AST 31  ALT 21  ALKPHOS 61  BILITOT 0.5   ------------------------------------------------------------------------------------------------------------------ estimated creatinine clearance is 101.4 mL/min (by C-G formula based on SCr of 0.87 mg/dL). ------------------------------------------------------------------------------------------------------------------ No results for input(s): TSH, T4TOTAL, T3FREE, THYROIDAB in the last 72 hours.  Invalid input(s): FREET3   Coagulation profile No results for input(s): INR, PROTIME in the last 168 hours. ------------------------------------------------------------------------------------------------------------------- No results for input(s): DDIMER in the last 72  hours. -------------------------------------------------------------------------------------------------------------------  Cardiac Enzymes No results for input(s): CKMB, TROPONINI, MYOGLOBIN in the last 168 hours.  Invalid input(s): CK ------------------------------------------------------------------------------------------------------------------ Invalid input(s): POCBNP   ---------------------------------------------------------------------------------------------------------------  Urinalysis    Component Value Date/Time   COLORURINE YELLOW 08/04/2021 1621   APPEARANCEUR HAZY (A) 08/04/2021 1621   LABSPEC 1.010 08/04/2021 1621   PHURINE 6.0 08/04/2021 1621   GLUCOSEU NEGATIVE 08/04/2021 1621   HGBUR MODERATE (A) 08/04/2021 1621   BILIRUBINUR NEGATIVE 08/04/2021 1621   KETONESUR NEGATIVE 08/04/2021 1621   PROTEINUR NEGATIVE 08/04/2021 1621   NITRITE NEGATIVE 08/04/2021 1621   LEUKOCYTESUR SMALL (A) 08/04/2021 1621     Imaging results:   DG Chest 1 View  Result Date: 08/04/2021 CLINICAL DATA:  Weakness EXAM: CHEST  1 VIEW COMPARISON:  Chest x-ray 07/31/2021 FINDINGS: Heart size within normal limits. Mediastinum appears stable. Calcified plaques in the aortic arch. Subsegmental atelectatic changes in the lung bases. No focal consolidation identified. No significant pleural effusion identified. No pneumothorax. IMPRESSION: Bibasilar subsegmental atelectasis. Electronically Signed   By: Ofilia Neas M.D.   On: 08/04/2021 15:22   CT HEAD WO CONTRAST (5MM)  Result Date: 08/04/2021 CLINICAL DATA:  Altered mental status, nontraumatic. EXAM: CT HEAD WITHOUT CONTRAST TECHNIQUE: Contiguous axial images were obtained from the base of the skull through the vertex without intravenous contrast. COMPARISON:  None. FINDINGS: Brain: There is mild cortical atrophy, within normal limits for patient age. The ventricles are normal in configuration. The basilar cisterns are patent. No mass,  mass effect, or midline shift. No acute intracranial hemorrhage is seen. No abnormal extra-axial fluid collection. Moderate periventricular and subcortical white matter hypodensities, nonspecific but most likely secondary to chronic ischemic white matter changes, within normal limits for patient age. 7 Preservation of the normal cortical gray-white interface without CT evidence of an acute major vascular territorial cortical based infarction. Vascular: No hyperdense vessel or unexpected calcification. Skull: Normal. Negative for fracture or focal lesion. Sinuses/Orbits: Status post bilateral intra-ocular lens replacements. Small anterior right maxillary sinus mucosal polyp. The mastoid air cells are clear. Other: None. IMPRESSION: Mild cortical atrophy and moderate chronic ischemic white matter changes, within normal limits for patient age. No acute intracranial process. Electronically Signed   By: Yvonne Kendall   On: 08/04/2021 15:28   CT Renal Stone Study  Result Date: 08/04/2021 CLINICAL DATA:  Foreign body, GU tract oliguria. decreased urinary output and increased generalized weakness since being discharged recently for kidney stone Standard full stone protocol used EXAM: CT ABDOMEN AND PELVIS  WITHOUT CONTRAST TECHNIQUE: Multidetector CT imaging of the abdomen and pelvis was performed following the standard protocol without IV contrast. COMPARISON:  None. FINDINGS: Lower chest: No acute abnormality. Three-vessel coronary artery calcification. Hepatobiliary: No focal liver abnormality. No gallstones, gallbladder wall thickening, or pericholecystic fluid. No biliary dilatation. Pancreas: No focal lesion. Normal pancreatic contour. No surrounding inflammatory changes. No main pancreatic ductal dilatation. Spleen: Normal in size without focal abnormality. Adrenals/Urinary Tract: No adrenal nodule bilaterally. There is a 3 mm calcified stone within the left kidney. Likely punctate calcification within the right  kidney. The other calcifications are likely vascular. No hydronephrosis. Bilateral renal cortical scarring. No definite contour-deforming renal mass. No ureterolithiasis or hydroureter. The urinary bladder is decompressed with a urinary bladder tip and balloon terminating within the lumen. Query slight perivesicular fat stranding and hazy urinary bladder wall contour. Stomach/Bowel: Stomach is within normal limits. No evidence of bowel wall thickening or dilatation. Appendix appears normal. Vascular/Lymphatic: No abdominal aorta or iliac aneurysm. Severe atherosclerotic plaque of the aorta and its branches. No abdominal, pelvic, or inguinal lymphadenopathy. Reproductive: Prostatectomy. Other: No intraperitoneal free fluid. No intraperitoneal free gas. No organized fluid collection. Musculoskeletal: Bilateral fat containing small inguinal hernias. No suspicious lytic or blastic osseous lesions. No acute displaced fracture. Multilevel severe degenerative changes of the spine. Diffusely decreased bone density. Grade 1 anterolisthesis of L4 on L5. L3-L5 posterolateral and interbody fusion. No pedicular screw at the L4 level on the left. IMPRESSION: 1. Query slight perivesicular fat stranding and hazy urinary bladder wall contour. Markedly limited evaluation due to collapse of the urinary bladder with Foley catheter in place. Recommend correlation with urinalysis for infection. 2. Nonobstructive bilateral nephrolithiasis, 3 mm on the left and punctate on the right. 3.  Bilateral fat containing small inguinal hernias. 4.  Aortic Atherosclerosis (ICD10-I70.0). Electronically Signed   By: Iven Finn M.D.   On: 08/04/2021 15:29       Assessment & Plan  Active Problems:   Irritability   Weakness/irritability -With recent prolonged hospital stay, he is with generalized weakness, but is already having PT/OT, no focal findings on physical exam, and no complaints of focal deficits, etc. report overall he has been  progressing with PT/OT, but main concern is him being more irritable baseline, and poor night sleep, I have explained for her patient had recent prolonged hospital stay where he was intubated, had prolonged ICU stay, and it will take time for mentation to come back, I have instructed her to discontinue his Zoloft and Abilify as has been recommended on recent discharge from the hospital especially with concern for serotonin syndrome, as she reported she has noted his irritability and mentation worsen when he is back on those meds, I have recommended for her to start low-dose Seroquel 25 mg at bedtime, and to follow with his PCP/psychiatry regarding up titration if it helps him, prescription has been sent to her pharmacy at [facility].    Family Communication: Plan discussed with daughter at bedside at length.     Phillips Climes M.D on 08/04/2021 at 8:09 PM   Thank you for the consult, we will follow the patient with you in the [facility]   Office  [phone]

## 2021-08-04 NOTE — ED Triage Notes (Signed)
Pt presents to ED via [facility] for decreased intake, decreased urinary output and increased generalized weakness since being discharged recently.

## 2021-08-04 NOTE — ED Notes (Signed)
BP reported to provider.

## 2021-08-04 NOTE — ED Notes (Signed)
Pt placed on cardiac monitor with BP to set cycle every 30 minutes. Continuous pulse oximeter applied.  

## 2021-08-04 NOTE — Discharge Instructions (Signed)
Contact a health care provider if: Symptoms do not get better or they become worse. New symptoms of delirium develop. Caring for the person at home does not seem safe. Eating, drinking, or communicating stops. There are side effects of medicines, such as changes in sleep patterns, dizziness, weight gain, restlessness, movement changes, or tremors. Get help right away if: The person has thoughts of harming self or harming others. There are serious side effects of medicine, such as: Swelling of the face, lips, tongue, or throat. Fever, confusion, muscle spasms, or seizures.

## 2021-08-04 NOTE — ED Provider Notes (Signed)
[facility] EMERGENCY DEPARTMENT Provider Note   CSN: [id number] Arrival date & time: 08/04/21  1339     History Recent admission with Klebsiella septicemia, bladder outlet obstruction, ureteral stones, intubation and ICU admission, serotonin reaction to re-institution of his Abilify and Zoloft and subsequent discontinuation.  Indwelling Foley catheter in place Chief Complaint  Patient presents with   Weakness    Patrick Kerr is a 76 y.o. male brought in by his daughter for increased weakness, oliguria and altered mental status.  History is gathered from the patient's daughter at bedside as the patient is currently confused.  She states that he has been able to do his PT exercises at home however in between yesterday and today has become too weak to hold himself up on his legs.  He fell yesterday trying to ambulate with his walker but was caught by family members and let down easily.  He saw his urologist yesterday and continues to have his Foley catheter in place.  His daughter states that he has normally put out about a liter of urine daily but between yesterday and this morning is only put out about 200 mL. Furthermore she states that he has been extremely irritable and she has been unsure what to do as he had his Zoloft and Abilify discontinued.    Weakness     Home Medications Prior to Admission medications   Medication Sig Start Date End Date Taking? Authorizing Provider  Acetaminophen 325 MG CAPS Take 2 capsules by mouth 2 (two) times daily.   Yes [provider]  albuterol (PROVENTIL) (2.5 MG/3ML) 0.083% nebulizer solution Take 3 mLs (2.5 mg total) by nebulization every 4 (four) hours as needed for wheezing or shortness of breath. 07/29/21 07/29/22 Yes Emokpae, Courage, MD  cyanocobalamin (,VITAMIN B-12,) 1000 MCG/ML injection Inject into the muscle.   Yes [provider]  Docusate Sodium (DSS) 100 MG CAPS Take 1 capsule by mouth 2 (two) times daily. 07/15/21   Yes [provider]  feeding supplement (ENSURE ENLIVE / ENSURE PLUS) LIQD Take 237 mLs by mouth 3 (three) times daily between meals. 07/29/21  Yes Emokpae, Courage, MD  hydrocortisone cream 1 % Apply 1 application topically 2 (two) times daily.   Yes [provider]  levothyroxine (SYNTHROID, LEVOTHROID) 137 MCG tablet Take 137 mcg by mouth daily before breakfast.   Yes [provider]  lidocaine (XYLOCAINE) 2 % solution Use as directed 10 mLs in the mouth or throat every 4 (four) hours as needed for mouth pain. 07/29/21  Yes Emokpae, Courage, MD  LORazepam (ATIVAN) 1 MG tablet Take 1 mg by mouth See admin instructions. Take 1/2 in the morning and 1/2 every evening can take up to 2 tablets   Yes [provider]  [place]   Yes [provider]  polyethylene glycol (MIRALAX / GLYCOLAX) 17 g packet Take 17 g by mouth daily. 07/30/21  Yes Emokpae, Courage, MD  QUEtiapine (SEROQUEL) 25 MG tablet Take 1 tablet (25 mg total) by mouth at bedtime. 08/04/21  Yes Elgergawy, Silver Huguenin, MD  Ergocalciferol 50 MCG (2000 UT) TABS Take 1 tablet by mouth daily. Patient not taking: Reported on 08/04/2021    [provider]  finasteride (PROSCAR) 5 MG tablet Take 1 tablet (5 mg total) by mouth daily. 08/03/21   Franchot Gallo, MD  guaiFENesin (MUCINEX) 600 MG 12 hr tablet Take 1 tablet (600 mg total) by mouth 2 (two) times daily for 10 days.  Patient not taking: Reported on 08/04/2021 07/29/21 08/08/21  Roxan Hockey, MD  lip balm (BLISTEX) OINT Apply 1 application topically as needed for lip care. Patient not taking: Reported on 08/04/2021 07/29/21   Roxan Hockey, MD  phenazopyridine (PYRIDIUM) 200 MG tablet Take 1 tablet (200 mg total) by mouth 3 (three) times daily. Patient not taking: Reported on 08/04/2021 07/29/21   Roxan Hockey, MD  tamsulosin (FLOMAX) 0.4 MG CAPS capsule Take 1 capsule (0.4 mg total) by mouth daily. For  Prostrate/Urination Patient not taking: Reported on 08/04/2021 07/29/21   Roxan Hockey, MD      Allergies    Sulfamethoxazole-trimethoprim, Amitriptyline, Azithromycin, Erythromycin, Erythromycin base, Mirabegron, Prednisone, and Hydromorphone    Review of Systems   Review of Systems  Neurological:  Positive for weakness.   Physical Exam Updated Vital Signs BP (!) 152/82    Pulse 81    Temp 99.1 F (37.3 C) (Rectal)    Resp (!) 25    Ht 6\' 4"  (1.93 m)    Wt 117.9 kg    SpO2 93%    BMI 31.65 kg/m  Physical Exam Vitals and nursing note reviewed.  Constitutional:      General: He is not in acute distress.    Appearance: He is well-developed. He is ill-appearing. He is not diaphoretic.  HENT:     Head: Normocephalic and atraumatic.     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  Eyes:     General: No scleral icterus.    Extraocular Movements: Extraocular movements intact.     Conjunctiva/sclera: Conjunctivae normal.     Pupils: Pupils are equal, round, and reactive to light.  Cardiovascular:     Rate and Rhythm: Normal rate and regular rhythm.     Heart sounds: Normal heart sounds.  Pulmonary:     Effort: Pulmonary effort is normal. No respiratory distress.     Breath sounds: Normal breath sounds.  Abdominal:     Palpations: Abdomen is soft.     Tenderness: There is no abdominal tenderness.  Genitourinary:    Comments: Foley catheter in place, leg bag on the right leg with very dark urine Musculoskeletal:     Cervical back: Normal range of motion and neck supple.  Skin:    General: Skin is warm and dry.  Neurological:     Mental Status: He is alert.     Comments: Oriented to self only  Psychiatric:        Behavior: Behavior normal.    ED Results / Procedures / Treatments   Labs (all labs ordered are listed, but only abnormal results are displayed) Labs  Reviewed  COMPREHENSIVE METABOLIC PANEL - Abnormal; Notable for the following components:      Result Value   Potassium 3.2 (*)    Glucose, Bld 112 (*)    Calcium 8.8 (*)    All other components within normal limits  CBC WITH DIFFERENTIAL/PLATELET - Abnormal; Notable for the following components:   RBC 3.06 (*)    Hemoglobin 10.6 (*)    HCT 32.9 (*)    MCV 107.5 (*)    MCH 34.6 (*)    All other components within normal limits  URINALYSIS, ROUTINE W REFLEX MICROSCOPIC - Abnormal; Notable for the following components:   APPearance HAZY (*)    Hgb urine dipstick MODERATE (*)    Leukocytes,Ua SMALL (*)  Bacteria, UA RARE (*)    All other components within normal limits  CBG MONITORING, ED - Abnormal; Notable for the following components:   Glucose-Capillary 105 (*)    All other components within normal limits  CULTURE, BLOOD (ROUTINE X 2)  CULTURE, BLOOD (ROUTINE X 2)  RESP PANEL BY RT-PCR (FLU A&B, COVID) ARPGX2  AMMONIA  LACTIC ACID, PLASMA    EKG EKG Interpretation  Date/Time:  Wednesday August 04 2021 13:56:45 EST Ventricular Rate:  83 PR Interval:    QRS Duration: 154 QT Interval:  407 QTC Calculation: 479 R Axis:   -57 Text Interpretation: No significant change since last tracing Confirmed by Isla Pence [phone]) on 08/04/2021 2:32:06 PM  Radiology DG Chest 1 View  Result Date: 08/04/2021 CLINICAL DATA:  Weakness EXAM: CHEST  1 VIEW COMPARISON:  Chest x-ray 07/31/2021 FINDINGS: Heart size within normal limits. Mediastinum appears stable. Calcified plaques in the aortic arch. Subsegmental atelectatic changes in the lung bases. No focal consolidation identified. No significant pleural effusion identified. No pneumothorax. IMPRESSION: Bibasilar subsegmental atelectasis. Electronically Signed   By: Ofilia Neas M.D.   On: 08/04/2021 15:22   CT HEAD WO CONTRAST (5MM)  Result Date: 08/04/2021 CLINICAL DATA:  Altered mental status, nontraumatic. EXAM: CT HEAD  WITHOUT CONTRAST TECHNIQUE: Contiguous axial images were obtained from the base of the skull through the vertex without intravenous contrast. COMPARISON:  None. FINDINGS: Brain: There is mild cortical atrophy, within normal limits for patient age. The ventricles are normal in configuration. The basilar cisterns are patent. No mass, mass effect, or midline shift. No acute intracranial hemorrhage is seen. No abnormal extra-axial fluid collection. Moderate periventricular and subcortical white matter hypodensities, nonspecific but most likely secondary to chronic ischemic white matter changes, within normal limits for patient age. 7 Preservation of the normal cortical gray-white interface without CT evidence of an acute major vascular territorial cortical based infarction. Vascular: No hyperdense vessel or unexpected calcification. Skull: Normal. Negative for fracture or focal lesion. Sinuses/Orbits: Status post bilateral intra-ocular lens replacements. Small anterior right maxillary sinus mucosal polyp. The mastoid air cells are clear. Other: None. IMPRESSION: Mild cortical atrophy and moderate chronic ischemic white matter changes, within normal limits for patient age. No acute intracranial process. Electronically Signed   By: Yvonne Kendall   On: 08/04/2021 15:28   CT Renal Stone Study  Result Date: 08/04/2021 CLINICAL DATA:  Foreign body, GU tract oliguria. decreased urinary output and increased generalized weakness since being discharged recently for kidney stone Standard full stone protocol used EXAM: CT ABDOMEN AND PELVIS WITHOUT CONTRAST TECHNIQUE: Multidetector CT imaging of the abdomen and pelvis was performed following the standard protocol without IV contrast. COMPARISON:  None. FINDINGS: Lower chest: No acute abnormality. Three-vessel coronary artery calcification. Hepatobiliary: No focal liver abnormality. No gallstones, gallbladder wall thickening, or pericholecystic fluid. No biliary dilatation.  Pancreas: No focal lesion. Normal pancreatic contour. No surrounding inflammatory changes. No main pancreatic ductal dilatation. Spleen: Normal in size without focal abnormality. Adrenals/Urinary Tract: No adrenal nodule bilaterally. There is a 3 mm calcified stone within the left kidney. Likely punctate calcification within the right kidney. The other calcifications are likely vascular. No hydronephrosis. Bilateral renal cortical scarring. No definite contour-deforming renal mass. No ureterolithiasis or hydroureter. The urinary bladder is decompressed with a urinary bladder tip and balloon terminating within the lumen. Query slight perivesicular fat stranding and hazy urinary bladder wall contour. Stomach/Bowel: Stomach is within normal limits. No evidence of bowel wall thickening or dilatation. Appendix  appears normal. Vascular/Lymphatic: No abdominal aorta or iliac aneurysm. Severe atherosclerotic plaque of the aorta and its branches. No abdominal, pelvic, or inguinal lymphadenopathy. Reproductive: Prostatectomy. Other: No intraperitoneal free fluid. No intraperitoneal free gas. No organized fluid collection. Musculoskeletal: Bilateral fat containing small inguinal hernias. No suspicious lytic or blastic osseous lesions. No acute displaced fracture. Multilevel severe degenerative changes of the spine. Diffusely decreased bone density. Grade 1 anterolisthesis of L4 on L5. L3-L5 posterolateral and interbody fusion. No pedicular screw at the L4 level on the left. IMPRESSION: 1. Query slight perivesicular fat stranding and hazy urinary bladder wall contour. Markedly limited evaluation due to collapse of the urinary bladder with Foley catheter in place. Recommend correlation with urinalysis for infection. 2. Nonobstructive bilateral nephrolithiasis, 3 mm on the left and punctate on the right. 3.  Bilateral fat containing small inguinal hernias. 4.  Aortic Atherosclerosis (ICD10-I70.0). Electronically Signed   By:  Iven Finn M.D.   On: 08/04/2021 15:29    Procedures Procedures   Medications Ordered in ED Medications  sodium chloride 0.9 % bolus 1,000 mL (0 mLs Intravenous Stopped 08/04/21 1726)    ED Course/ Medical Decision Making/ A&P                           Medical Decision Making  This patient presents to the ED for concern of weakness/ AMS, this involves an extensive number of treatment options, and is a complaint that carries with it a high risk of complications and morbidity.  The differential diagnosis includes deconditioning, worsening renal failure, metabolic encephalopathy, recurrence of infection, delirium from medication changes.   Co morbidities that complicate the patient evaluation  recent hospitalization for severe sepsis and MODS, use of chronic antipsychotic and antidepressant meds, confusiona and altered mental status   Additional history obtained: Daughter at bedside   Additional history obtained from daughter at bedside External records from outside source obtained and reviewed including op urology visit with Dr. Diona Fanti   Lab Tests:  I Ordered, and personally interpreted labs.  The pertinent results include:  Cmp- mile hypokalemia Cbc- baseline anemia no Leukocytosis Lactate wnl UA- no infection Resp pnl wnl   Imaging Studies ordered:  I ordered imaging studies including ct renal/ head, 1 v cxr I independently visualized and interpreted imaging which showed  Renal- no significant changes from  previous study Head/ cxr no acute findings  I agree with the radiologist interpretation   Cardiac Monitoring:  The patient was maintained on a cardiac monitor.  I personally viewed and interpreted the cardiac monitored which showed an underlying rhythm of: NSR   Medicines ordered and prescription drug management:  I ordered medication including fluids  for oliguria Reevaluation of the patient after these medicines showed that the patient improved with  op of 300 ml I have reviewed the patients home medicines and have made adjustments as needed   Test Considered:    Critical Interventions:    Consultations Obtained:  I requested consultation with the Dr. Urban Gibson,  and discussed lab and imaging findings as well as pertinent plan - they recommend: d/c of zoloft and abilify- start Seroquel   Problem List / ED Course:  as above   Reevaluation:  After the interventions noted above, I reevaluated the patient and found that they have :stayed the same   Social Determinants of Health:  Patient has close OP follow up- home health and PT/ good family support and seems appropriate for discharge  after consultation with hospitialist and shared decision making with the daughter   Dispostion:  After consideration of the diagnostic results and the patients response to treatment, I feel that the patent would benefit from discharge and continued pt.  Final Clinical Impression(s) / ED Diagnoses Final diagnoses:  Altered mental status, unspecified altered mental status type  Oliguria  Irritability and anger    Rx / DC Orders ED Discharge Orders          Ordered    QUEtiapine (SEROQUEL) 25 MG tablet  Daily at bedtime        08/04/21 1948              Margarita Mail, PA-C 08/05/21 [id number]    Isla Pence, MD 08/05/21 1034

## 2021-08-09 ENCOUNTER — Telehealth: Payer: Self-pay | Admitting: Student

## 2021-08-09 NOTE — Telephone Encounter (Signed)
I received a phone call from Dr. Terence Lux of [facility] in [facility] who stated that Mr. Patrick Kerr was at the emergency room with penile pain which could be corresponding to UTI or bladder spasms.  He underwent work-up there and he is hemodynamically stable and without leukocytosis.  Per the outside provider, imaging revealed persistent distal ureteral stone without hydronephrosis.  He is being discharged from the emergency room but they wanted to make sure that he had close follow-up with urology.  I will make Dr. Retta Diones aware as this patient was recently seen in [place]. Cherlyn Labella, MD [facility] PGY4 [facility]

## 2021-08-10 ENCOUNTER — Telehealth: Payer: Self-pay

## 2021-08-10 LAB — CULTURE, BLOOD (ROUTINE X 2)
Culture: NO GROWTH
Culture: NO GROWTH
Special Requests: ADEQUATE
Special Requests: ADEQUATE

## 2021-08-10 NOTE — Telephone Encounter (Signed)
Received triage message that patient went to ER for foley catheter issues.  I called and spoke with daughter, [name].  Daughter reports increase confusion and decrease urine output. Daughter called EMS and patient went to [facility].  Patient treated for uti and placed on levaquin 750mg  for 1 week.  Daughter has requested records from previous urology to be faxed for his upcoming appointment.  Daughter instructed to continue medication as prescribed from ER and continue with oral fluids as well. Daughter understands if patient symptoms worsen patient would need to return to nearest ER. Voiced understanding.

## 2021-08-12 ENCOUNTER — Ambulatory Visit: Payer: No Typology Code available for payment source | Admitting: Urology

## 2021-08-17 ENCOUNTER — Ambulatory Visit: Payer: No Typology Code available for payment source | Admitting: Physician Assistant

## 2021-08-17 DIAGNOSIS — N320 Bladder-neck obstruction: Secondary | ICD-10-CM

## 2021-08-17 DIAGNOSIS — N179 Acute kidney failure, unspecified: Secondary | ICD-10-CM

## 2021-08-17 DIAGNOSIS — N401 Enlarged prostate with lower urinary tract symptoms: Secondary | ICD-10-CM

## 2021-08-23 ENCOUNTER — Other Ambulatory Visit: Payer: Self-pay

## 2021-08-23 ENCOUNTER — Ambulatory Visit (INDEPENDENT_AMBULATORY_CARE_PROVIDER_SITE_OTHER): Payer: No Typology Code available for payment source | Admitting: Physician Assistant

## 2021-08-23 ENCOUNTER — Other Ambulatory Visit: Payer: Self-pay | Admitting: Physician Assistant

## 2021-08-23 VITALS — BP 105/70 | HR 87 | Wt 260.0 lb

## 2021-08-23 DIAGNOSIS — N390 Urinary tract infection, site not specified: Secondary | ICD-10-CM

## 2021-08-23 DIAGNOSIS — N201 Calculus of ureter: Secondary | ICD-10-CM

## 2021-08-23 DIAGNOSIS — N401 Enlarged prostate with lower urinary tract symptoms: Secondary | ICD-10-CM

## 2021-08-23 DIAGNOSIS — N138 Other obstructive and reflux uropathy: Secondary | ICD-10-CM

## 2021-08-23 DIAGNOSIS — R338 Other retention of urine: Secondary | ICD-10-CM | POA: Diagnosis not present

## 2021-08-23 MED ORDER — SILODOSIN 8 MG PO CAPS: 8.0000 mg | ORAL_CAPSULE | Freq: Every day | ORAL | 2 refills | Status: DC

## 2021-08-23 NOTE — Progress Notes (Signed)
Assessment: 1. Enlarged prostate with urinary retention   2. Ureteral calculus, right   3. BPH with urinary obstruction   4. Urinary tract infection without hematuria, site unspecified     Plan: FU with Dahlstedt as soon as his schedule allows to discuss stone intervention. The foley was replaced today by nursing and Dr. Alyson Ingles who agrees to treat the pt's stone if Dr. Geryl Councilman schedule does not allow. Rapaflo prescribed and pt instructed to begin medication today. He will continue to take finasteride. Recommended no additional cath changes until fu with [place] next week.   Chief Complaint: No chief complaint on file.  HPI: 08/23/21 Patrick Kerr is a 76 y.o. male who presents for continued evaluation of urinary retention, BPH, and distal right ureteral stone. Since the pt's last visit, the pt has continued with home health, but his daughter is very concerned that he will become septic again and shares his multiple ED visits for catheter changes due to inadequate flow in the urine bag. She is upset about her father's current status and wants something done to treat his prostate enlargement and the blood they have seen in the foley bag. The last foley change was 1/15. The pt has had no recent fevers, change in mental status, or c/o pain outside of the foley. He is on finasteride and currently on no antibx.  08/03/21 76 year old male presents for follow-up of urinary tract infections/sepsis as well as CT evidence of distal ureteral calculi on CT scan at the time of emergency room presentation.     1. No acute findings within the abdomen or pelvis. 2. Stones in the distal right ureter without findings of obstruction, and unchanged compared to the prior CT. Small nonobstructing intrarenal stones. 3. 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 4. Aortic atherosclerosis.   His urologic history is extensive, and most of his urologic  intervention has been in [place], [place].  He apparently was treated for a right distal ureteral stone with shockwave lithotripsy in approximately January 2021.  The stone never passed.  He has not had repeat treatment.  Back about 3 to 4 years ago he had UroLift by a physician in [place], [place].  This did not improve his urinary symptomatology.  He had a circumcision in the fall 2022.  Following that, he went into urinary retention and basically has had a catheter since that time.  He is on tamsulosin.  He is not on finasteride.  The patient and his daughter want further urologic management here in [place].  He does have what sounds like fairly frequent bladder spasms.  He is having no gross hematuria.  He is having no flank pain.  He is at home now, following his hospitalization for sepsis.  He is quite debilitated and getting physical therapy.  He can walk perhaps 10 steps with assistance.    Portions of the above documentation were copied from a prior visit for review purposes only.  Allergies: Allergies  Allergen Reactions   Sulfamethoxazole-Trimethoprim Other (See Comments) and Rash   Amitriptyline Other (See Comments)    Elavil makes him "hyper".   Azithromycin Other (See Comments)    Causes ulcers   Erythromycin Other (See Comments)    Pt states gave him an ulcer   Erythromycin Base    Mirabegron Other (See Comments)    Headache   Prednisone Itching and Other (See Comments)    Numbness, pt states feet felt like they were on fire  Hydromorphone Itching    PMH: Past Medical History:  Diagnosis Date   Arthritis    Hypertension     PSH: Past Surgical History:  Procedure Laterality Date   CIRCUMCISION  05/25/2021   TRANSURETHRAL RESECTION OF PROSTATE      SH: Social History   Tobacco Use   Smoking status: Never   Smokeless tobacco: Never  Vaping Use   Vaping Use: Every day  Substance Use Topics   Alcohol use: No   Drug use: No     ROS: Constitutional:  Negative for fever, chills CV: Negative for chest pain, +edema Respiratory:  Negative for shortness of breath, wheezing, frequent cough GI:  Negative for nausea, vomiting, bloody stool, GERD  PE: BP 105/70    Pulse 87    Wt 260 lb (117.9 kg)    BMI 31.65 kg/m  GENERAL APPEARANCE:  No obvious distress. Pt appears comfy and is in wheelchair HEENT:  Atraumatic, normocephalic NECK:  Supple  ABDOMEN:  Soft, non-tender, no masses EXTREMITIES:  Moves all extremities well NEUROLOGIC:  Alert and responsive to conversation MENTAL STATUS:  appropriate SKIN:  Warm, dry, and intact, poor skin turgor   Results: No results found for this or any previous visit (from the past 24 hour(s)).

## 2021-08-23 NOTE — Progress Notes (Signed)
Cath Change/ Replacement  Patient is present today for a catheter change due to urinary retention.  18ml of water was removed from the balloon, a 16FR foley cath was removed with out difficulty.  Patient was cleaned and prepped in a sterile fashion with betadine. A 16 coude FR foley cath was replaced into the bladder complications were noted as: unable to pass catheter. Dr. Ronne Binning came in to assist and placed catheter.  Urine return was noted 54ml and urine was pale in color after flushing.. The balloon was filled with 43ml of sterile water. A leg bag was attached for drainage.  the patient and patient was Patient was given proper instruction on catheter care.    Performed by: Marchelle Folks RN  Follow up: patient to f/u with Dr. Retta Diones per Noreene Larsson.

## 2021-08-24 ENCOUNTER — Telehealth: Payer: Self-pay

## 2021-08-24 NOTE — Telephone Encounter (Signed)
Patient's daughter called and wanted to see if you could return her call regarding some concerns.

## 2021-08-25 ENCOUNTER — Ambulatory Visit: Payer: No Typology Code available for payment source | Admitting: Urology

## 2021-08-27 NOTE — Telephone Encounter (Signed)
Daughter called and made aware. 

## 2021-08-27 NOTE — Telephone Encounter (Signed)
Patients daughter called and made aware. °

## 2021-08-28 ENCOUNTER — Encounter (HOSPITAL_COMMUNITY): Payer: Self-pay | Admitting: Emergency Medicine

## 2021-08-28 ENCOUNTER — Emergency Department (HOSPITAL_COMMUNITY): Payer: No Typology Code available for payment source

## 2021-08-28 ENCOUNTER — Other Ambulatory Visit: Payer: Self-pay

## 2021-08-28 ENCOUNTER — Inpatient Hospital Stay (HOSPITAL_COMMUNITY)
Admission: EM | Admit: 2021-08-28 | Discharge: 2021-09-06 | DRG: 698 | Disposition: A | Discharge status: Skilled Nursing Facility | Payer: No Typology Code available for payment source | Attending: Family Medicine | Admitting: Family Medicine

## 2021-08-28 DIAGNOSIS — F4310 Post-traumatic stress disorder, unspecified: Secondary | ICD-10-CM | POA: Diagnosis present

## 2021-08-28 DIAGNOSIS — N138 Other obstructive and reflux uropathy: Secondary | ICD-10-CM | POA: Diagnosis present

## 2021-08-28 DIAGNOSIS — D696 Thrombocytopenia, unspecified: Secondary | ICD-10-CM

## 2021-08-28 DIAGNOSIS — R5381 Other malaise: Secondary | ICD-10-CM | POA: Diagnosis present

## 2021-08-28 DIAGNOSIS — E669 Obesity, unspecified: Secondary | ICD-10-CM | POA: Diagnosis present

## 2021-08-28 DIAGNOSIS — R55 Syncope and collapse: Secondary | ICD-10-CM | POA: Diagnosis not present

## 2021-08-28 DIAGNOSIS — F0393 Unspecified dementia, unspecified severity, with mood disturbance: Secondary | ICD-10-CM | POA: Diagnosis present

## 2021-08-28 DIAGNOSIS — G459 Transient cerebral ischemic attack, unspecified: Secondary | ICD-10-CM

## 2021-08-28 DIAGNOSIS — R338 Other retention of urine: Secondary | ICD-10-CM | POA: Diagnosis not present

## 2021-08-28 DIAGNOSIS — G9341 Metabolic encephalopathy: Secondary | ICD-10-CM | POA: Diagnosis present

## 2021-08-28 DIAGNOSIS — N401 Enlarged prostate with lower urinary tract symptoms: Secondary | ICD-10-CM | POA: Diagnosis not present

## 2021-08-28 DIAGNOSIS — I951 Orthostatic hypotension: Secondary | ICD-10-CM | POA: Diagnosis not present

## 2021-08-28 DIAGNOSIS — I10 Essential (primary) hypertension: Secondary | ICD-10-CM | POA: Diagnosis present

## 2021-08-28 DIAGNOSIS — Z79890 Hormone replacement therapy (postmenopausal): Secondary | ICD-10-CM

## 2021-08-28 DIAGNOSIS — E039 Hypothyroidism, unspecified: Secondary | ICD-10-CM | POA: Diagnosis present

## 2021-08-28 DIAGNOSIS — B965 Pseudomonas (aeruginosa) (mallei) (pseudomallei) as the cause of diseases classified elsewhere: Secondary | ICD-10-CM | POA: Diagnosis present

## 2021-08-28 DIAGNOSIS — Y846 Urinary catheterization as the cause of abnormal reaction of the patient, or of later complication, without mention of misadventure at the time of the procedure: Secondary | ICD-10-CM | POA: Diagnosis present

## 2021-08-28 DIAGNOSIS — Z79899 Other long term (current) drug therapy: Secondary | ICD-10-CM

## 2021-08-28 DIAGNOSIS — Z20822 Contact with and (suspected) exposure to covid-19: Secondary | ICD-10-CM | POA: Diagnosis present

## 2021-08-28 DIAGNOSIS — R4182 Altered mental status, unspecified: Secondary | ICD-10-CM | POA: Diagnosis present

## 2021-08-28 DIAGNOSIS — R419 Unspecified symptoms and signs involving cognitive functions and awareness: Secondary | ICD-10-CM

## 2021-08-28 DIAGNOSIS — D6959 Other secondary thrombocytopenia: Secondary | ICD-10-CM | POA: Diagnosis present

## 2021-08-28 DIAGNOSIS — D529 Folate deficiency anemia, unspecified: Secondary | ICD-10-CM | POA: Diagnosis present

## 2021-08-28 DIAGNOSIS — I4510 Unspecified right bundle-branch block: Secondary | ICD-10-CM | POA: Diagnosis present

## 2021-08-28 DIAGNOSIS — T83518A Infection and inflammatory reaction due to other urinary catheter, initial encounter: Secondary | ICD-10-CM | POA: Diagnosis present

## 2021-08-28 DIAGNOSIS — Z6831 Body mass index (BMI) 31.0-31.9, adult: Secondary | ICD-10-CM

## 2021-08-28 DIAGNOSIS — F17200 Nicotine dependence, unspecified, uncomplicated: Secondary | ICD-10-CM

## 2021-08-28 DIAGNOSIS — Z9079 Acquired absence of other genital organ(s): Secondary | ICD-10-CM | POA: Diagnosis not present

## 2021-08-28 DIAGNOSIS — E876 Hypokalemia: Secondary | ICD-10-CM | POA: Diagnosis not present

## 2021-08-28 DIAGNOSIS — N390 Urinary tract infection, site not specified: Secondary | ICD-10-CM | POA: Diagnosis not present

## 2021-08-28 DIAGNOSIS — F05 Delirium due to known physiological condition: Secondary | ICD-10-CM | POA: Diagnosis not present

## 2021-08-28 DIAGNOSIS — R8271 Bacteriuria: Secondary | ICD-10-CM

## 2021-08-28 DIAGNOSIS — N3000 Acute cystitis without hematuria: Secondary | ICD-10-CM | POA: Diagnosis present

## 2021-08-28 DIAGNOSIS — R059 Cough, unspecified: Secondary | ICD-10-CM

## 2021-08-28 LAB — CBC
HCT: 36.5 % — ABNORMAL LOW (ref 39.0–52.0)
Hemoglobin: 11.9 g/dL — ABNORMAL LOW (ref 13.0–17.0)
MCH: 33.4 pg (ref 26.0–34.0)
MCHC: 32.6 g/dL (ref 30.0–36.0)
MCV: 102.5 fL — ABNORMAL HIGH (ref 80.0–100.0)
Platelets: 140 10*3/uL — ABNORMAL LOW (ref 150–400)
RBC: 3.56 MIL/uL — ABNORMAL LOW (ref 4.22–5.81)
RDW: 12.3 % (ref 11.5–15.5)
WBC: 5.2 10*3/uL (ref 4.0–10.5)
nRBC: 0 % (ref 0.0–0.2)

## 2021-08-28 LAB — DIFFERENTIAL
Abs Immature Granulocytes: 0.02 10*3/uL (ref 0.00–0.07)
Basophils Absolute: 0 10*3/uL (ref 0.0–0.1)
Basophils Relative: 0 %
Eosinophils Absolute: 0.3 10*3/uL (ref 0.0–0.5)
Eosinophils Relative: 5 %
Immature Granulocytes: 0 %
Lymphocytes Relative: 15 %
Lymphs Abs: 0.8 10*3/uL (ref 0.7–4.0)
Monocytes Absolute: 0.4 10*3/uL (ref 0.1–1.0)
Monocytes Relative: 8 %
Neutro Abs: 3.7 10*3/uL (ref 1.7–7.7)
Neutrophils Relative %: 72 %

## 2021-08-28 LAB — PROTIME-INR
INR: 1.2 (ref 0.8–1.2)
Prothrombin Time: 14.8 seconds (ref 11.4–15.2)

## 2021-08-28 LAB — RAPID URINE DRUG SCREEN, HOSP PERFORMED
Amphetamines: NOT DETECTED
Barbiturates: NOT DETECTED
Benzodiazepines: NOT DETECTED
Cocaine: NOT DETECTED
Opiates: NOT DETECTED
Tetrahydrocannabinol: NOT DETECTED

## 2021-08-28 LAB — COMPREHENSIVE METABOLIC PANEL
ALT: 14 U/L (ref 0–44)
AST: 19 U/L (ref 15–41)
Albumin: 3.1 g/dL — ABNORMAL LOW (ref 3.5–5.0)
Alkaline Phosphatase: 50 U/L (ref 38–126)
Anion gap: <3 — ABNORMAL LOW (ref 5–15)
BUN: 12 mg/dL (ref 8–23)
CO2: 29 mmol/L (ref 22–32)
Calcium: 8.6 mg/dL — ABNORMAL LOW (ref 8.9–10.3)
Chloride: 104 mmol/L (ref 98–111)
Creatinine, Ser: 0.89 mg/dL (ref 0.61–1.24)
GFR, Estimated: >60 mL/min (ref >60)
Glucose, Bld: 117 mg/dL — ABNORMAL HIGH (ref 70–99)
Potassium: 4.2 mmol/L (ref 3.5–5.1)
Sodium: 135 mmol/L (ref 135–145)
Total Bilirubin: 0.4 mg/dL (ref 0.3–1.2)
Total Protein: 6.9 g/dL (ref 6.5–8.1)

## 2021-08-28 LAB — I-STAT CHEM 8, ED
BUN: 11 mg/dL (ref 8–23)
Calcium, Ion: 1.22 mmol/L (ref 1.15–1.40)
Chloride: 102 mmol/L (ref 98–111)
Creatinine, Ser: 0.7 mg/dL (ref 0.61–1.24)
Glucose, Bld: 112 mg/dL — ABNORMAL HIGH (ref 70–99)
HCT: 37 % — ABNORMAL LOW (ref 39.0–52.0)
Hemoglobin: 12.6 g/dL — ABNORMAL LOW (ref 13.0–17.0)
Potassium: 4.3 mmol/L (ref 3.5–5.1)
Sodium: 139 mmol/L (ref 135–145)
TCO2: 28 mmol/L (ref 22–32)

## 2021-08-28 LAB — APTT: aPTT: 29 seconds (ref 24–36)

## 2021-08-28 LAB — RESP PANEL BY RT-PCR (FLU A&B, COVID) ARPGX2
Influenza A by PCR: NEGATIVE
Influenza B by PCR: NEGATIVE
SARS Coronavirus 2 by RT PCR: NEGATIVE

## 2021-08-28 LAB — URINALYSIS, ROUTINE W REFLEX MICROSCOPIC
Bilirubin Urine: NEGATIVE
Glucose, UA: NEGATIVE mg/dL
Hgb urine dipstick: NEGATIVE
Ketones, ur: NEGATIVE mg/dL
Nitrite: POSITIVE — AB
Protein, ur: NEGATIVE mg/dL
Specific Gravity, Urine: 1.01 (ref 1.005–1.030)
pH: 7 (ref 5.0–8.0)

## 2021-08-28 LAB — ETHANOL: Alcohol, Ethyl (B): <10 mg/dL (ref <10)

## 2021-08-28 LAB — URINALYSIS, MICROSCOPIC (REFLEX)

## 2021-08-28 MED ORDER — ONDANSETRON HCL 4 MG/2ML IJ SOLN
4.0000 mg | Freq: Four times a day (QID) | INTRAMUSCULAR | Status: DC | PRN
  Administered 2021-09-01: 4 mg via INTRAVENOUS
  Filled 2021-08-28: qty 2

## 2021-08-28 MED ORDER — ENSURE ENLIVE PO LIQD
237.0000 mL | Freq: Three times a day (TID) | ORAL | Status: DC
  Administered 2021-08-28 – 2021-09-06 (×7): 237 mL via ORAL
  Filled 2021-08-28 (×3): qty 237

## 2021-08-28 MED ORDER — ONDANSETRON HCL 4 MG PO TABS: 4.0000 mg | ORAL_TABLET | Freq: Four times a day (QID) | ORAL | Status: DC | PRN

## 2021-08-28 MED ORDER — ERGOCALCIFEROL 200 MCG/ML PO SOLN
50.0000 ug | Freq: Every day | ORAL | Status: DC
  Filled 2021-08-28 (×2): qty 0.25

## 2021-08-28 MED ORDER — POLYETHYLENE GLYCOL 3350 17 G PO PACK
17.0000 g | PACK | Freq: Every day | ORAL | Status: DC
  Administered 2021-08-30 – 2021-09-06 (×7): 17 g via ORAL
  Filled 2021-08-28 (×8): qty 1

## 2021-08-28 MED ORDER — SODIUM CHLORIDE 0.9 % IV SOLN
100.0000 mL/h | INTRAVENOUS | Status: DC
  Administered 2021-08-28 (×2): 100 mL/h via INTRAVENOUS

## 2021-08-28 MED ORDER — CHLORHEXIDINE GLUCONATE CLOTH 2 % EX PADS
6.0000 | MEDICATED_PAD | Freq: Every day | CUTANEOUS | Status: DC
  Administered 2021-08-29 – 2021-09-03 (×6): 6 via TOPICAL

## 2021-08-28 MED ORDER — POTASSIUM CHLORIDE IN NACL 20-0.9 MEQ/L-% IV SOLN: INTRAVENOUS | Status: DC

## 2021-08-28 MED ORDER — ASPIRIN 325 MG PO TABS
325.0000 mg | ORAL_TABLET | Freq: Every day | ORAL | Status: DC
  Administered 2021-08-28 – 2021-09-06 (×9): 325 mg via ORAL
  Filled 2021-08-28 (×9): qty 1

## 2021-08-28 MED ORDER — ENOXAPARIN SODIUM 40 MG/0.4ML IJ SOSY
40.0000 mg | PREFILLED_SYRINGE | INTRAMUSCULAR | Status: DC
  Administered 2021-08-28 – 2021-09-06 (×6): 40 mg via SUBCUTANEOUS
  Filled 2021-08-28 (×8): qty 0.4

## 2021-08-28 MED ORDER — DOCUSATE SODIUM 100 MG PO CAPS
100.0000 mg | ORAL_CAPSULE | Freq: Every day | ORAL | Status: DC
  Administered 2021-08-30 – 2021-09-06 (×7): 100 mg via ORAL
  Filled 2021-08-28: qty 2
  Filled 2021-08-28 (×5): qty 1
  Filled 2021-08-28: qty 2
  Filled 2021-08-28 (×3): qty 1

## 2021-08-28 MED ORDER — TAMSULOSIN HCL 0.4 MG PO CAPS
0.4000 mg | ORAL_CAPSULE | Freq: Every day | ORAL | Status: DC
  Administered 2021-08-29 – 2021-09-06 (×7): 0.4 mg via ORAL
  Filled 2021-08-28 (×8): qty 1

## 2021-08-28 MED ORDER — LORAZEPAM 2 MG/ML IJ SOLN
1.0000 mg | Freq: Four times a day (QID) | INTRAMUSCULAR | Status: DC | PRN
  Administered 2021-08-29 – 2021-09-01 (×5): 1 mg via INTRAVENOUS
  Filled 2021-08-28 (×5): qty 1

## 2021-08-28 MED ORDER — ALBUTEROL SULFATE (2.5 MG/3ML) 0.083% IN NEBU: 2.5000 mg | INHALATION_SOLUTION | RESPIRATORY_TRACT | Status: DC | PRN

## 2021-08-28 MED ORDER — LORAZEPAM 0.5 MG PO TABS
0.5000 mg | ORAL_TABLET | Freq: Two times a day (BID) | ORAL | Status: DC
  Administered 2021-08-29 – 2021-09-01 (×4): 0.5 mg via ORAL
  Filled 2021-08-28 (×6): qty 1

## 2021-08-28 MED ORDER — PAROXETINE HCL 20 MG PO TABS
10.0000 mg | ORAL_TABLET | Freq: Every day | ORAL | Status: DC
  Administered 2021-08-29 – 2021-09-06 (×8): 10 mg via ORAL
  Filled 2021-08-28: qty 0.5
  Filled 2021-08-28 (×3): qty 1
  Filled 2021-08-28: qty 0.5
  Filled 2021-08-28: qty 1
  Filled 2021-08-28: qty 0.5
  Filled 2021-08-28 (×2): qty 1
  Filled 2021-08-28: qty 0.5
  Filled 2021-08-28: qty 1
  Filled 2021-08-28: qty 0.5
  Filled 2021-08-28 (×2): qty 1
  Filled 2021-08-28 (×2): qty 0.5
  Filled 2021-08-28: qty 1

## 2021-08-28 MED ORDER — FINASTERIDE 5 MG PO TABS
5.0000 mg | ORAL_TABLET | Freq: Every day | ORAL | Status: DC
Start: 2021-08-29 — End: 2021-09-06
  Administered 2021-08-29 – 2021-09-06 (×8): 5 mg via ORAL
  Filled 2021-08-28 (×8): qty 1

## 2021-08-28 MED ORDER — ACETAMINOPHEN 325 MG PO TABS
650.0000 mg | ORAL_TABLET | Freq: Four times a day (QID) | ORAL | Status: DC | PRN
  Administered 2021-08-29 – 2021-09-06 (×7): 650 mg via ORAL
  Filled 2021-08-28 (×7): qty 2

## 2021-08-28 MED ORDER — LORAZEPAM 1 MG PO TABS
1.0000 mg | ORAL_TABLET | Freq: Every day | ORAL | Status: DC
  Administered 2021-08-28 – 2021-09-01 (×4): 1 mg via ORAL
  Filled 2021-08-28 (×5): qty 1

## 2021-08-28 MED ORDER — SODIUM CHLORIDE 0.9 % IV SOLN
1.0000 g | INTRAVENOUS | Status: DC
  Administered 2021-08-28 – 2021-08-29 (×2): 1 g via INTRAVENOUS
  Filled 2021-08-28 (×2): qty 10

## 2021-08-28 MED ORDER — LISINOPRIL 10 MG PO TABS
20.0000 mg | ORAL_TABLET | Freq: Every day | ORAL | Status: DC
  Administered 2021-08-29 – 2021-08-30 (×2): 20 mg via ORAL
  Filled 2021-08-28 (×2): qty 2

## 2021-08-28 MED ORDER — SODIUM CHLORIDE 0.9 % IV BOLUS
500.0000 mL | Freq: Once | INTRAVENOUS | Status: AC
  Administered 2021-08-28: 500 mL via INTRAVENOUS

## 2021-08-28 MED ORDER — ACETAMINOPHEN 325 MG PO TABS
650.0000 mg | ORAL_TABLET | Freq: Once | ORAL | Status: AC
Start: 2021-08-28 — End: 2021-08-28
  Administered 2021-08-28: 650 mg via ORAL
  Filled 2021-08-28: qty 2

## 2021-08-28 MED ORDER — LORAZEPAM 0.5 MG PO TABS
0.5000 mg | ORAL_TABLET | Freq: Once | ORAL | Status: AC
  Administered 2021-08-28: 0.5 mg via ORAL
  Filled 2021-08-28: qty 1

## 2021-08-28 MED ORDER — LEVOTHYROXINE SODIUM 137 MCG PO TABS
137.0000 ug | ORAL_TABLET | Freq: Every day | ORAL | Status: DC
  Administered 2021-08-30 – 2021-09-06 (×8): 137 ug via ORAL
  Filled 2021-08-28 (×9): qty 1

## 2021-08-28 MED ORDER — ACETAMINOPHEN 650 MG RE SUPP
650.0000 mg | Freq: Four times a day (QID) | RECTAL | Status: DC | PRN
  Administered 2021-09-01 – 2021-09-02 (×2): 650 mg via RECTAL
  Filled 2021-08-28 (×3): qty 1

## 2021-08-28 MED ORDER — ARIPIPRAZOLE 5 MG PO TABS
7.5000 mg | ORAL_TABLET | Freq: Every day | ORAL | Status: DC
  Administered 2021-08-29 – 2021-09-01 (×4): 7.5 mg via ORAL
  Filled 2021-08-28 (×4): qty 2

## 2021-08-28 NOTE — ED Notes (Signed)
Daughter reports symptoms of right side weakness and speech changes began at 0830.  Daughter reports the pt started repeating DAB DAB DAB.

## 2021-08-28 NOTE — ED Triage Notes (Signed)
Pt to the ED with [facility] from home with reports of weakness that began this morning.  Home health noticed right arm weakness at 0730 and the pt was checked by the fire dept.  Ems reports NIH of zero, dementia at baseline, with a chronic foley placed last week by urology for urinary retention.

## 2021-08-28 NOTE — ED Provider Notes (Signed)
[facility] EMERGENCY DEPARTMENT Provider Note   CSN: [id number] Arrival date & time: 08/28/21  1027     History  Chief Complaint  Patient presents with   Weakness    Patrick Kerr is a 76 y.o. male.   Weakness Associated symptoms: no fever    Patient has a history of hypertension, arthritis, dementia, hypothyroidism and urinary retention.   According to the EMS report he presented to the ER for evaluation of weakness.  Staff at the facility felt that his right arm was weak.  They noticed that this morning about 730.  Patient himself denies any particular complaints.  He is not really sure why he is here.  He denies having any trouble with weakness.  He is not having any pain.  He denies any fevers or chills.  Patient arrived at the bedside after my initial assessment.  Patient lives with the daughter.  He has been recovering from a recent hospitalization in December with sepsis.  He woke up this morning and initially was fine.  Daughter states he had sudden onset of difficulty with his speech and movement of his right side.  He kept on saying DAB DAB.  Those symptoms resolved after about 15 to 30 minutes.  He is completely back to his baseline at this time  Home Medications Prior to Admission medications   Medication Sig Start Date End Date Taking? Authorizing Provider  Acetaminophen 325 MG CAPS Take 2 capsules by mouth 2 (two) times daily.    [provider]  albuterol (PROVENTIL) (2.5 MG/3ML) 0.083% nebulizer solution Take 3 mLs (2.5 mg total) by nebulization every 4 (four) hours as needed for wheezing or shortness of breath. 07/29/21 07/29/22  Roxan Hockey, MD  ARIPiprazole (ABILIFY) 5 MG tablet Take by mouth. 08/21/21   [provider]  cyanocobalamin (,VITAMIN B-12,) 1000 MCG/ML injection Inject into the muscle.    [provider]  Docusate Sodium (DSS) 100 MG CAPS Take 1 capsule by mouth 2 (two) times daily. 07/15/21   [provider]   Ergocalciferol 50 MCG (2000 UT) TABS Take 1 tablet by mouth daily.    [provider]  feeding supplement (ENSURE ENLIVE / ENSURE PLUS) LIQD Take 237 mLs by mouth 3 (three) times daily between meals. 07/29/21   Roxan Hockey, MD  finasteride (PROSCAR) 5 MG tablet Take 1 tablet (5 mg total) by mouth daily. 08/03/21   Franchot Gallo, MD  hydrocortisone cream 1 % Apply 1 application topically 2 (two) times daily.    [provider]  levothyroxine (SYNTHROID, LEVOTHROID) 137 MCG tablet Take 137 mcg by mouth daily before breakfast.    [provider]  lisinopril (ZESTRIL) 20 MG tablet Take by mouth. 08/21/21   [provider]  LORazepam (ATIVAN) 1 MG tablet Take 1 mg by mouth See admin instructions. Take 1/2 in the morning and 1/2 every evening can take up to 2 tablets    [provider]  [place]    [provider]  PARoxetine (PAXIL) 10 MG tablet Take by mouth. 08/09/21   [provider]  polyethylene glycol (MIRALAX / GLYCOLAX) 17 g packet Take 17 g by mouth daily. 07/30/21   Roxan Hockey, MD  potassium chloride (KLOR-CON) 10 MEQ tablet Take 10 mEq by mouth 2 (two) times daily. 08/21/21   [provider]  silodosin (RAPAFLO) 8 MG CAPS capsule Take 1 capsule (8 mg total) by mouth daily with breakfast. 08/23/21   [place]lin,  Regan Rakers, PA-C      Allergies    Sulfamethoxazole-trimethoprim, Amitriptyline, Azithromycin, Erythromycin, Erythromycin base, Mirabegron, Prednisone, and Hydromorphone    Review of Systems   Review of Systems  Constitutional:  Negative for fever.  Neurological:  Positive for weakness.   Physical Exam Updated Vital Signs BP (!) 159/78    Pulse 74    Temp 97.8 F (36.6 C) (Oral)    Resp 17    Ht 1.93 m (6\' 4" )    Wt 117.9 kg    SpO2 96%    BMI 31.65 kg/m  Physical Exam Vitals and nursing note reviewed.  Constitutional:      General: He is not in acute distress.     Appearance: He is well-developed.  HENT:     Head: Normocephalic and atraumatic.     Right Ear: External ear normal.     Left Ear: External ear normal.  Eyes:     General: No scleral icterus.       Right eye: No discharge.        Left eye: No discharge.     Conjunctiva/sclera: Conjunctivae normal.  Neck:     Trachea: No tracheal deviation.  Cardiovascular:     Rate and Rhythm: Normal rate and regular rhythm.  Pulmonary:     Effort: Pulmonary effort is normal. No respiratory distress.     Breath sounds: Normal breath sounds. No stridor. No wheezing or rales.  Abdominal:     General: Bowel sounds are normal. There is no distension.     Palpations: Abdomen is soft.     Tenderness: There is no abdominal tenderness. There is no guarding or rebound.  Musculoskeletal:        General: No tenderness.     Cervical back: Neck supple.  Skin:    General: Skin is warm and dry.     Findings: No rash.  Neurological:     Mental Status: He is alert.     Cranial Nerves: No cranial nerve deficit.     Sensory: No sensory deficit.     Motor: No abnormal muscle tone or seizure activity.     Coordination: Coordination normal.     Comments: No pronator drift bilateral upper extrem, able to hold both legs off bed for 5 seconds, sensation intact in all extremities, no visual field cuts, no left or right sided neglect, , no nystagmus noted  No facial droop, extraocular movements intact, tongue midline  Patient is oriented to place.  Thinks he is in [place].  Is unable to tell me the date  Some confusion with following commands.  Held his arms up in the air when I asked him to but when I asked him to squeeze my hands he ended up lifting his arm up in the air again    ED Results / Procedures / Treatments   Labs (all labs ordered are listed, but only abnormal results are displayed) Labs Reviewed  CBC - Abnormal; Notable for the following components:      Result Value   RBC 3.56 (*)     Hemoglobin 11.9 (*)    HCT 36.5 (*)    MCV 102.5 (*)    Platelets 140 (*)    All other components within normal limits  COMPREHENSIVE METABOLIC PANEL - Abnormal; Notable for the following components:   Glucose, Bld 117 (*)    Calcium 8.6 (*)    Albumin 3.1 (*)    Anion gap <3 (*)  All other components within normal limits  URINALYSIS, ROUTINE W REFLEX MICROSCOPIC - Abnormal; Notable for the following components:   Nitrite POSITIVE (*)    Leukocytes,Ua TRACE (*)    All other components within normal limits  URINALYSIS, MICROSCOPIC (REFLEX) - Abnormal; Notable for the following components:   Bacteria, UA MANY (*)    All other components within normal limits  I-STAT CHEM 8, ED - Abnormal; Notable for the following components:   Glucose, Bld 112 (*)    Hemoglobin 12.6 (*)    HCT 37.0 (*)    All other components within normal limits  RESP PANEL BY RT-PCR (FLU A&B, COVID) ARPGX2  URINE CULTURE  ETHANOL  PROTIME-INR  APTT  DIFFERENTIAL  RAPID URINE DRUG SCREEN, HOSP PERFORMED    EKG EKG Interpretation  Date/Time:  Saturday August 28 2021 10:42:17 EST Ventricular Rate:  81 PR Interval:  172 QRS Duration: 126 QT Interval:  399 QTC Calculation: 464 R Axis:   91 Text Interpretation: Sinus or ectopic atrial rhythm RBBB and LPFB , new since last tracing Confirmed by Dorie Rank [phone]) on 08/28/2021 12:43:30 PM  Radiology CT HEAD WO CONTRAST  Result Date: 08/28/2021 CLINICAL DATA:  Weakness in right arm EXAM: CT HEAD WITHOUT CONTRAST TECHNIQUE: Contiguous axial images were obtained from the base of the skull through the vertex without intravenous contrast. RADIATION DOSE REDUCTION: This exam was performed according to the departmental dose-optimization program which includes automated exposure control, adjustment of the mA and/or kV according to patient size and/or use of iterative reconstruction technique. COMPARISON:  CT head 08/04/2021 FINDINGS: Brain: There is no evidence of  acute intracranial hemorrhage, extra-axial fluid collection, or acute infarct. Parenchymal volume is within normal limits. The ventricles are stable in size. There is confluent hypodensity in the subcortical and periventricular white matter likely reflecting sequela of moderate chronic white matter microangiopathy, unchanged. There is no mass lesion.  There is no midline shift. Vascular: There is calcification of the bilateral cavernous ICAs. Skull: Normal. Negative for fracture or focal lesion. Sinuses/Orbits: The imaged paranasal sinuses are clear. Bilateral lens implants are in place. The globes and orbits are otherwise unremarkable. Other: None. IMPRESSION: No acute intracranial pathology. No significant interval change since 08/04/2021. Electronically Signed   By: Valetta Mole M.D.   On: 08/28/2021 11:24    Procedures Procedures    Medications Ordered in ED Medications  sodium chloride 0.9 % bolus 500 mL (0 mLs Intravenous Stopped 08/28/21 1321)    Followed by  0.9 %  sodium chloride infusion (100 mL/hr Intravenous New Bag/Given 08/28/21 1137)  acetaminophen (TYLENOL) tablet 650 mg (650 mg Oral Given 08/28/21 1227)    ED Course/ Medical Decision Making/ A&P Clinical Course as of 08/28/21 1339  Sat Aug 28, 2021  1241 I-stat chem 8, ED(!) I-STAT Chem-8 without significant abnormalities [JK]  1241 Resp Panel by RT-PCR (Flu A&B, Covid) Nasopharyngeal Swab COVID and flu are negative [JK]  1241 Comprehensive metabolic panel(!) Metabolic panel without acute abnormalities [JK]  1242 CT HEAD WO CONTRAST Head CT images and radiology report reviewed.  No acute abnormality noted [JK]  1309 Urinalysis does show many bacteria positive nitrate and leukocyte esterase [JK]    Clinical Course User Index [JK] Dorie Rank, MD                           Medical Decision Making Amount and/or Complexity of Data Reviewed Labs: ordered. Decision-making details documented in ED  Course. Radiology: ordered.  Decision-making details documented in ED Course.  Risk OTC drugs. Prescription drug management. Decision regarding hospitalization.   Altered mental status Patient presented with an episode of what sounds like possible TIA.  He had speech disturbance and weakness.  No seizure-like activity witnessed by daughter.  Initial CT without acute findings.  Neurologic exam has returned to baseline but I do feel the patient will require further work-up including possible MRI.  I will consult the medical service regarding admission.  Case discussed with Dr Carles Collet  Melina Modena Patient has had frequent urinary tract infections urosepsis and has an indwelling catheter.  His urinalysis does show many bacteria.  He is not having significant urinary symptoms at this time but this could also be a factor although colonization is also a possibility.  We will send off urine culture.  I did review outpatient laboratory tests available in care everywhere.  On January 23 patient did have a urine culture that did not show any growth.  We will hold off on empiric antibiotics now and repeat culture testing         Final Clinical Impression(s) / ED Diagnoses Final diagnoses:  RBBB  TIA (transient ischemic attack)  Acute cystitis without hematuria     Dorie Rank, MD 08/29/21 [phone]

## 2021-08-28 NOTE — ED Notes (Signed)
Patient transported to CT 

## 2021-08-28 NOTE — H&P (Signed)
History and Physical  Patrick Kerr [id number] DOB: 24-Nov-1945 DOA: 08/28/2021   PCP: Ludwig Clarks, FNP   Patient coming from: Home  Chief Complaint: altered mental status  HPI:  Patrick Kerr is a 76 y.o. male with medical history of dementia, BOO, BPH s/p TURP, chronic indwelling Foley catheter, hypothyroidism, hypertension, PTSD presenting with altered mental status.  The patient was recently hospitalized from 07/17/2021 to 07/29/2021 for septic shock due to Klebsiella UTI.  During that hospitalization the patient required intubation.  There was also a question of serotonin syndrome resulting in discontinuation of his Abilify and Zoloft.  The patient was discharged home with home health physical therapy.  The patient returned to the emergency department once again on 08/04/2021 with generalized weakness and irritability.  He was seen by the hospitalist at that time who felt that there was no new acute issue and this most likely is continued natural recovery from his recent prolonged hospitalization.  Once again, the patient was hospitalized at [facility] from 08/18/2021 to 08/21/2021 for agitation, confusion, and aggressive behavior.  It was felt that this was due to the patient's electrolyte derangement, UTI, and being off his psychiatric medications.  He was seen by psychiatry at that time who restarted the patient's Abilify at 7.5 mg daily.  Ativan was given 0.5 mg twice daily during the day and 1 mg at bedtime. The patient's daughter states that since returning home from [place] general, the patient has been doing fairly well up until the morning of 08/28/2021.  After breakfast, she noted that the patient was somewhat confused.  She stated that he had some word finding difficulties and kept repeating the same words.  In addition, the patient was doing nonsensical things with his right arm.  She stated that he was not particularly having any focal weakness, but which is  grabbing at things that were not there and also grabbing at objects for no reason.  There was no loss of consciousness.  EMS was activated, and she stated that although he was a little bit more lucid he still remained somewhat confused in the emergency department.  In fact, at the time of my evaluation, the patient still have difficulty recognizing the patient's daughter, but he was able to speak clearly.  She states that he last had his Foley catheter changed on 08/23/2021. There is been no reports of headache, chest pain, short of breath, cough, hemoptysis, nausea, vomiting without abdominal pain. ED Patient was afebrile hemodynamically stable with oxygen saturation 97% room air.  BMP showed a sodium 135, potassium 4.2, chloride 104, bicarbonate 29, BUN 12, creatinine 0.9.  AST 19, ALT 14, alk phosphatase 50, total bilirubin 0.4.  WBC 5.2, hemoglobin 1.9, platelets 140,000.  CT of the brain was negative for any acute findings.  UA showed 21-50 WBC.  UDS was negative.  Assessment/Plan: Acute metabolic encephalopathy -Secondary to UTI -07/18/2021 serum B12 415 -07/17/2021 TSH--0.053 -UA 21-50WBC -Follow urine culture  Depression/PTSD -Restart Abilify 7.5 mg daily, Paxil 10 mg daily -continue ativan 0.5 mg am and lunch, 33m hs  BPH status post TURP -Continue finasteride and Rapaflo -has chronic indwelling foley  Hypothyroidism -Continue Synthroid  Hypertension -Continue lisinopril  Major Neurocognitive Disorder -has had propensity for agitation  Obesity -BMI 31.65 -lifestyle modification    Past Medical History:  Diagnosis Date   Arthritis    Hypertension    Past Surgical History:  Procedure Laterality Date   CIRCUMCISION  05/25/2021  TRANSURETHRAL RESECTION OF PROSTATE     Social History:  reports that he has never smoked. He has never used smokeless tobacco. He reports that he does not drink alcohol and does not use drugs.   History reviewed. No pertinent family  history.   Allergies  Allergen Reactions   Sulfamethoxazole-Trimethoprim Other (See Comments) and Rash   Amitriptyline Other (See Comments)    Elavil makes him "hyper".   Azithromycin Other (See Comments)    Causes ulcers   Erythromycin Other (See Comments)    Pt states gave him an ulcer   Erythromycin Base    Mirabegron Other (See Comments)    Headache   Prednisone Itching and Other (See Comments)    Numbness, pt states feet felt like they were on fire   Hydromorphone Itching     Prior to Admission medications   Medication Sig Start Date End Date Taking? Authorizing Provider  Acetaminophen 325 MG CAPS Take 2 capsules by mouth 2 (two) times daily.   Yes [provider]  albuterol (PROVENTIL) (2.5 MG/3ML) 0.083% nebulizer solution Take 3 mLs (2.5 mg total) by nebulization every 4 (four) hours as needed for wheezing or shortness of breath. 07/29/21 07/29/22 Yes Emokpae, Courage, MD  ARIPiprazole (ABILIFY) 5 MG tablet Take 7.5 mg by mouth daily. 08/21/21  Yes [provider]  cyanocobalamin (,VITAMIN B-12,) 1000 MCG/ML injection Inject into the muscle.   Yes [provider]  Docusate Sodium (DSS) 100 MG CAPS Take 1 capsule by mouth daily. 07/15/21  Yes [provider]  Ergocalciferol 50 MCG (2000 UT) TABS Take 1 tablet by mouth daily.   Yes [provider]  feeding supplement (ENSURE ENLIVE / ENSURE PLUS) LIQD Take 237 mLs by mouth 3 (three) times daily between meals. 07/29/21  Yes Emokpae, Courage, MD  finasteride (PROSCAR) 5 MG tablet Take 1 tablet (5 mg total) by mouth daily. 08/03/21  Yes Dahlstedt, Annie Main, MD  hydrocortisone cream 1 % Apply 1 application topically 2 (two) times daily.   Yes [provider]  levothyroxine (SYNTHROID, LEVOTHROID) 137 MCG tablet Take 137 mcg by mouth daily before breakfast.   Yes [provider]  lisinopril (ZESTRIL) 20 MG tablet Take 20 mg by mouth daily. 08/21/21  Yes [provider]  LORazepam (ATIVAN) 1 MG tablet Take 1 mg by mouth See admin instructions. Take 1/2 in the morning and 1/2 at noon and up to 1 mg around 7pm   Yes [provider]  [place]   Yes [provider]  PARoxetine (PAXIL) 10 MG tablet Take 10 mg by mouth daily. 08/09/21  Yes [provider]  polyethylene glycol (MIRALAX / GLYCOLAX) 17 g packet Take 17 g by mouth daily. 07/30/21  Yes Emokpae, Courage, MD  tamsulosin (FLOMAX) 0.4 MG CAPS capsule Take 0.4 mg by mouth daily after breakfast.   Yes [provider]  potassium chloride (KLOR-CON) 10 MEQ tablet Take 20 mEq by mouth daily. 08/21/21   [provider]  silodosin (RAPAFLO) 8 MG CAPS capsule Take 1 capsule (8 mg total) by mouth daily with breakfast. Patient not taking: Reported on 08/28/2021 08/23/21   Summerlin, Berneice Heinrich, PA-C    Review of Systems:  Limited due to patient's encephalopathy  Physical Exam: Vitals:   08/28/21 1230 08/28/21 1300 08/28/21 1330 08/28/21 1400  BP: 133/75 (!) 159/78 (!) 160/70 (!) 155/80  Pulse: 73 74 72 73  Resp: 20 17 17 15   Temp:  TempSrc:      SpO2: 97% 96% 97% 98%  Weight:      Height:       General:  A&O x 2, NAD, nontoxic, pleasant/cooperative Head/Eye: No conjunctival hemorrhage, no icterus, [place]/AT, No nystagmus ENT:  No icterus,  No thrush, good dentition, no pharyngeal exudate Neck:  No masses, no lymphadenpathy, no bruits CV:  RRR, no rub, no gallop, no S3 Lung:  bibasilar rales. No wheeze Abdomen: soft/NT, +BS, nondistended, no peritoneal signs Ext: No cyanosis, No rashes, No petechiae, No lymphangitis, No edema Neuro: CNII-XII intact, strength 4/5 in bilateral upper and lower extremities, no dysmetria  Labs on Admission:  Basic Metabolic Panel: Recent Labs  Lab 08/28/21 1049 08/28/21 1133  NA 135 139  K 4.2 4.3  CL 104 102  CO2 29  --   GLUCOSE 117* 112*  BUN 12 11  CREATININE 0.89 0.70  CALCIUM 8.6*  --     Liver Function Tests: Recent Labs  Lab 08/28/21 1049  AST 19  ALT 14  ALKPHOS 50  BILITOT 0.4  PROT 6.9  ALBUMIN 3.1*   No results for input(s): LIPASE, AMYLASE in the last 168 hours. No results for input(s): AMMONIA in the last 168 hours. CBC: Recent Labs  Lab 08/28/21 1049 08/28/21 1133  WBC 5.2  --   NEUTROABS 3.7  --   HGB 11.9* 12.6*  HCT 36.5* 37.0*  MCV 102.5*  --   PLT 140*  --    Coagulation Profile: Recent Labs  Lab 08/28/21 1049  INR 1.2   Cardiac Enzymes: No results for input(s): CKTOTAL, CKMB, CKMBINDEX, TROPONINI in the last 168 hours. BNP: Invalid input(s): POCBNP CBG: No results for input(s): GLUCAP in the last 168 hours. Urine analysis:    Component Value Date/Time   COLORURINE YELLOW 08/28/2021 [place] 08/28/2021 1219   LABSPEC 1.010 08/28/2021 1219   PHURINE 7.0 08/28/2021 [place] 08/28/2021 1219   HGBUR NEGATIVE 08/28/2021 1219   [place] 08/28/2021 1219   KETONESUR NEGATIVE 08/28/2021 1219   PROTEINUR NEGATIVE 08/28/2021 1219   NITRITE POSITIVE (A) 08/28/2021 1219   LEUKOCYTESUR TRACE (A) 08/28/2021 1219   Sepsis Labs: @LABRCNTIP (procalcitonin:4,lacticidven:4) ) Recent Results (from the past 240 hour(s))  Resp Panel by RT-PCR (Flu A&B, Covid) Nasopharyngeal Swab     Status: None   Collection Time: 08/28/21 11:17 AM   Specimen: Nasopharyngeal Swab; Nasopharyngeal(NP) swabs in vial transport medium  Result Value Ref Range Status   SARS Coronavirus 2 by RT PCR NEGATIVE NEGATIVE Final    Comment: (NOTE) SARS-CoV-2 target nucleic acids are NOT DETECTED.  The SARS-CoV-2 RNA is generally detectable in upper respiratory specimens during the acute phase of infection. The lowest concentration of SARS-CoV-2 viral copies this assay can detect is 138 copies/mL. A negative result does not preclude SARS-Cov-2 infection and should not be used as the sole basis for treatment or other patient  management decisions. A negative result may occur with  improper specimen collection/handling, submission of specimen other than nasopharyngeal swab, presence of viral mutation(s) within the areas targeted by this assay, and inadequate number of viral copies(<138 copies/mL). A negative result must be combined with clinical observations, patient history, and epidemiological information. The expected result is Negative.  Fact Sheet for Patients:  EntrepreneurPulse.com.au  Fact Sheet for Healthcare Providers:  IncredibleEmployment.be  This test is no t yet approved or cleared by the [place] FDA and  has been authorized for detection and/or diagnosis  of SARS-CoV-2 by FDA under an Emergency Use Authorization (EUA). This EUA will remain  in effect (meaning this test can be used) for the duration of the COVID-19 declaration under Section 564(b)(1) of the Act, 21 U.S.C.section 360bbb-3(b)(1), unless the authorization is terminated  or revoked sooner.       Influenza A by PCR NEGATIVE NEGATIVE Final   Influenza B by PCR NEGATIVE NEGATIVE Final    Comment: (NOTE) The Xpert Xpress SARS-CoV-2/FLU/RSV plus assay is intended as an aid in the diagnosis of influenza from Nasopharyngeal swab specimens and should not be used as a sole basis for treatment. Nasal washings and aspirates are unacceptable for Xpert Xpress SARS-CoV-2/FLU/RSV testing.  Fact Sheet for Patients: EntrepreneurPulse.com.au  Fact Sheet for Healthcare Providers: IncredibleEmployment.be  This test is not yet approved or cleared by the [place] FDA and has been authorized for detection and/or diagnosis of SARS-CoV-2 by FDA under an Emergency Use Authorization (EUA). This EUA will remain in effect (meaning this test can be used) for the duration of the COVID-19 declaration under Section 564(b)(1) of the Act, 21 U.S.C. section 360bbb-3(b)(1),  unless the authorization is terminated or revoked.  Performed at [facility], [address] [place], [place] [address]      Radiological Exams on Admission: CT HEAD WO CONTRAST  Result Date: 08/28/2021 CLINICAL DATA:  Weakness in right arm EXAM: CT HEAD WITHOUT CONTRAST TECHNIQUE: Contiguous axial images were obtained from the base of the skull through the vertex without intravenous contrast. RADIATION DOSE REDUCTION: This exam was performed according to the departmental dose-optimization program which includes automated exposure control, adjustment of the mA and/or kV according to patient size and/or use of iterative reconstruction technique. COMPARISON:  CT head 08/04/2021 FINDINGS: Brain: There is no evidence of acute intracranial hemorrhage, extra-axial fluid collection, or acute infarct. Parenchymal volume is within normal limits. The ventricles are stable in size. There is confluent hypodensity in the subcortical and periventricular white matter likely reflecting sequela of moderate chronic white matter microangiopathy, unchanged. There is no mass lesion.  There is no midline shift. Vascular: There is calcification of the bilateral cavernous ICAs. Skull: Normal. Negative for fracture or focal lesion. Sinuses/Orbits: The imaged paranasal sinuses are clear. Bilateral lens implants are in place. The globes and orbits are otherwise unremarkable. Other: None. IMPRESSION: No acute intracranial pathology. No significant interval change since 08/04/2021. Electronically Signed   By: Valetta Mole M.D.   On: 08/28/2021 11:24    EKG: Independently reviewed. Sinus, RBBB    Time spent:80 minutes Code Status:   FULL Family Communication:  daughter updated at bedside 1/28 Disposition Plan: expect 2 day hospitalization Consults called: none  DVT Prophylaxis: [place] Lovenox  Orson Eva, DO  [facility] Pager [phone]  If 7PM-7AM, please contact night-coverage [email or web address] Password  [facility] 08/28/2021, 2:30 PM

## 2021-08-28 NOTE — Progress Notes (Signed)
1830: Pt arrived to room 315 via stretcher from ED. Pt moved from stretcher to bed with staff assist x3. Pt awake, alert to self, DOB, and knows that he is in the hospital, but doesn't know which one or why. Pt cooperative, denies c/o. VSS, tele applied. Foley cath intact to standard drainage bag. IVF infusing without s/s infiltration.  Family members and private sitter at bedside. Home meds reviewed with daughter and daughter relays that pt has episodes of severe anxiety which have required IV ativan while in the hospital and she requests that MD be notified for an order for same. MD Tat notified of daughter's request and rationale for request.  Pt currently eating supper with family's assist.

## 2021-08-29 LAB — CBC
HCT: 34 % — ABNORMAL LOW (ref 39.0–52.0)
Hemoglobin: 10.9 g/dL — ABNORMAL LOW (ref 13.0–17.0)
MCH: 33.2 pg (ref 26.0–34.0)
MCHC: 32.1 g/dL (ref 30.0–36.0)
MCV: 103.7 fL — ABNORMAL HIGH (ref 80.0–100.0)
Platelets: 128 10*3/uL — ABNORMAL LOW (ref 150–400)
RBC: 3.28 MIL/uL — ABNORMAL LOW (ref 4.22–5.81)
RDW: 12.3 % (ref 11.5–15.5)
WBC: 4.7 10*3/uL (ref 4.0–10.5)
nRBC: 0 % (ref 0.0–0.2)

## 2021-08-29 LAB — BASIC METABOLIC PANEL
Anion gap: 6 (ref 5–15)
BUN: 11 mg/dL (ref 8–23)
CO2: 25 mmol/L (ref 22–32)
Calcium: 8.7 mg/dL — ABNORMAL LOW (ref 8.9–10.3)
Chloride: 107 mmol/L (ref 98–111)
Creatinine, Ser: 0.75 mg/dL (ref 0.61–1.24)
GFR, Estimated: >60 mL/min (ref >60)
Glucose, Bld: 92 mg/dL (ref 70–99)
Potassium: 3.7 mmol/L (ref 3.5–5.1)
Sodium: 138 mmol/L (ref 135–145)

## 2021-08-29 MED ORDER — VITAMIN D (ERGOCALCIFEROL) 1.25 MG (50000 UNIT) PO CAPS
50000.0000 [IU] | ORAL_CAPSULE | ORAL | Status: DC
  Administered 2021-08-29: 50000 [IU] via ORAL
  Filled 2021-08-29 (×2): qty 1

## 2021-08-29 MED ORDER — HALOPERIDOL LACTATE 5 MG/ML IJ SOLN
5.0000 mg | Freq: Four times a day (QID) | INTRAMUSCULAR | Status: DC | PRN
  Administered 2021-08-29: 5 mg via INTRAMUSCULAR
  Filled 2021-08-29 (×2): qty 1

## 2021-08-29 MED ORDER — POTASSIUM CHLORIDE IN NACL 20-0.9 MEQ/L-% IV SOLN: INTRAVENOUS | Status: AC

## 2021-08-29 NOTE — Progress Notes (Signed)
Patient currently appears calm in bed sleeping. Tele again pulled off of chest and  lying at bedside. All vitals are within normal limits. Mittens on private sitter at bedside.

## 2021-08-29 NOTE — Plan of Care (Signed)
  Problem: Coping: Goal: Level of anxiety will decrease Outcome: Progressing   Problem: Pain Managment: Goal: General experience of comfort will improve Outcome: Progressing   Problem: Safety: Goal: Ability to remain free from injury will improve Outcome: Progressing   

## 2021-08-29 NOTE — Progress Notes (Signed)
Patient removed IV catheter. IV is intact and site assessed and clean, dry and intact. Patient is combative and agitated. Unable to attempt IV access at this time. MD has been notified.

## 2021-08-29 NOTE — Progress Notes (Signed)
PROGRESS NOTE  Patrick Kerr SEG:[id number] DOB: July 15, 1946 DOA: 08/28/2021 PCP: Ludwig Clarks, FNP  Brief History:  76 y.o. male with medical history of dementia, BOO, BPH s/p TURP, chronic indwelling Foley catheter, hypothyroidism, hypertension, PTSD presenting with altered mental status.  The patient was recently hospitalized from 07/17/2021 to 07/29/2021 for septic shock due to Klebsiella UTI.  During that hospitalization the patient required intubation.  There was also a question of serotonin syndrome resulting in discontinuation of his Abilify and Zoloft.  The patient was discharged home with home health physical therapy.  The patient returned to the emergency department once again on 08/04/2021 with generalized weakness and irritability.  He was seen by the hospitalist at that time who felt that there was no new acute issue and this most likely is continued natural recovery from his recent prolonged hospitalization.  Once again, the patient was hospitalized at [facility] from 08/18/2021 to 08/21/2021 for agitation, confusion, and aggressive behavior.  It was felt that this was due to the patient's electrolyte derangement, UTI, and being off his psychiatric medications.  He was seen by psychiatry at that time who restarted the patient's Abilify at 7.5 mg daily.  Ativan was given 0.5 mg twice daily during the day and 1 mg at bedtime. The patient's daughter states that since returning home from [facility], the patient has been doing fairly well up until the morning of 08/28/2021.  After breakfast, she noted that the patient was somewhat confused.  Caretaker stated that patient was speaking nonsensically and kept repeating the same words.  In addition, the patient was doing nonsensical things with his right arm.  Care taker stated that he was not particularly having any focal weakness, but which is grabbing at things that were not there and also grabbing at objects for no  reason.  There was no loss of consciousness.  She feel patient may have been incited by something his spouse said.  EMS was activated, and she stated that although he was a little bit more lucid he still remained somewhat confused in the emergency department.  In fact, at the time of my evaluation, the patient still have difficulty recognizing the patient's daughter, but he was able to speak clearly.  She states that he last had his Foley catheter changed on 08/23/2021. There is been no reports of headache, chest pain, short of breath, cough, hemoptysis, nausea, vomiting without abdominal pain. ED Patient was afebrile hemodynamically stable with oxygen saturation 97% room air.  BMP showed a sodium 135, potassium 4.2, chloride 104, bicarbonate 29, BUN 12, creatinine 0.9.  AST 19, ALT 14, alk phosphatase 50, total bilirubin 0.4.  WBC 5.2, hemoglobin 1.9, platelets 140,000.  CT of the brain was negative for any acute findings.  UA showed 21-50 WBC.  UDS was negative.    Assessment/Plan: Acute metabolic encephalopathy -Secondary to UTI -07/18/2021 serum B12 415 -07/17/2021 TSH--0.053 -UA 21-50WBC -Follow urine culture -1/29--pleasantly confused, having sundowning episode -EEG -MRI brain on 1/30 am   Depression/PTSD -Restart Abilify 7.5 mg daily, Paxil 10 mg daily -continue ativan 0.5 mg am and lunch, 22m hs -ativan prn agitation   BPH status post TURP -Continue finasteride and Rapaflo -has chronic indwelling foley -pt suppose to have voiding trial in am 1/30   Hypothyroidism -Continue Synthroid   Hypertension -Continue lisinopril   Major Neurocognitive Disorder -has had propensity for agitation   Obesity -BMI 31.65 -lifestyle modification  Family Communication:   daughter updated 1/29  Consultants:  none  Code Status:  FULL   DVT Prophylaxis:  [place] Lovenox   Procedures: As Listed in Progress Note Above  Antibiotics: Ceftriaxone  1/28>>      Subjective: Patient denies fevers, chills, headache, chest pain, dyspnea, nausea, vomiting, diarrhea, abdominal pain,    Objective: Vitals:   08/28/21 2157 08/29/21 0208 08/29/21 0555 08/29/21 1444  BP: (!) 117/55 131/76 (!) 143/85 (!) 153/75  Pulse: 85 82 93 81  Resp: _0 Temp: (!) 97.4 F (36.3 C) 98 F (36.7 C) (!) 97 F (36.1 C) 97.7 F (36.5 C)  TempSrc: Oral   Oral  SpO2: 93% 94% 93% 96%  Weight:      Height:        Intake/Output Summary (Last 24 hours) at 08/29/2021 1609 Last data filed at 08/29/2021 1500 Gross per 24 hour  Intake 2134.19 ml  Output 875 ml  Net 1259.19 ml   Weight change:  Exam:  General:  Pt is alert, follows commands appropriately, not in acute distress HEENT: No icterus, No thrush, No neck mass, [place]/AT Cardiovascular: RRR, S1/S2, no rubs, no gallops Respiratory: diminished BS at bases Abdomen: Soft/+BS, non tender, non distended, no guarding Extremities: No edema, No lymphangitis, No petechiae, No rashes, no synovitis   Data Reviewed: I have personally reviewed following labs and imaging studies Basic Metabolic Panel: Recent Labs  Lab 08/28/21 1049 08/28/21 1133 08/29/21 0431  NA 135 139 138  K 4.2 4.3 3.7  CL 104 102 107  CO2 29  --  25  GLUCOSE 117* 112* 92  BUN _1 CREATININE 0.89 0.70 0.75  CALCIUM 8.6*  --  8.7*   Liver Function Tests: Recent Labs  Lab 08/28/21 1049  AST 19  ALT 14  ALKPHOS 50  BILITOT 0.4  PROT 6.9  ALBUMIN 3.1*   No results for input(s): LIPASE, AMYLASE in the last 168 hours. No results for input(s): AMMONIA in the last 168 hours. Coagulation Profile: Recent Labs  Lab 08/28/21 1049  INR 1.2   CBC: Recent Labs  Lab 08/28/21 1049 08/28/21 1133 08/29/21 0431  WBC 5.2  --  4.7  NEUTROABS 3.7  --   --   HGB 11.9* 12.6* 10.9*  HCT 36.5* 37.0* 34.0*  MCV 102.5*  --  103.7*  PLT 140*  --  128*   Cardiac Enzymes: No results for input(s): CKTOTAL, CKMB,  CKMBINDEX, TROPONINI in the last 168 hours. BNP: Invalid input(s): POCBNP CBG: No results for input(s): GLUCAP in the last 168 hours. HbA1C: No results for input(s): HGBA1C in the last 72 hours. Urine analysis:    Component Value Date/Time   COLORURINE YELLOW 08/28/2021 [place] 08/28/2021 1219   LABSPEC 1.010 08/28/2021 1219   PHURINE 7.0 08/28/2021 1219   GLUCOSEU NEGATIVE 08/28/2021 1219   HGBUR NEGATIVE 08/28/2021 1219   [place] 08/28/2021 1219   [place] 08/28/2021 1219   PROTEINUR NEGATIVE 08/28/2021 1219   NITRITE POSITIVE (A) 08/28/2021 1219   LEUKOCYTESUR TRACE (A) 08/28/2021 1219   Sepsis Labs: _2 (procalcitonin:4,lacticidven:4) ) Recent Results (from the past 240 hour(s))  Resp Panel by RT-PCR (Flu A&B, Covid) Nasopharyngeal Swab     Status: None   Collection Time: 08/28/21 11:17 AM   Specimen: Nasopharyngeal Swab; Nasopharyngeal(NP) swabs in vial transport medium  Result Value Ref Range Status   SARS Coronavirus 2 by RT PCR NEGATIVE NEGATIVE Final  Comment: (NOTE) SARS-CoV-2 target nucleic acids are NOT DETECTED.  The SARS-CoV-2 RNA is generally detectable in upper respiratory specimens during the acute phase of infection. The lowest concentration of SARS-CoV-2 viral copies this assay can detect is 138 copies/mL. A negative result does not preclude SARS-Cov-2 infection and should not be used as the sole basis for treatment or other patient management decisions. A negative result may occur with  improper specimen collection/handling, submission of specimen other than nasopharyngeal swab, presence of viral mutation(s) within the areas targeted by this assay, and inadequate number of viral copies(<138 copies/mL). A negative result must be combined with clinical observations, patient history, and epidemiological information. The expected result is Negative.  Fact Sheet for Patients:   EntrepreneurPulse.com.au  Fact Sheet for Healthcare Providers:  IncredibleEmployment.be  This test is no t yet approved or cleared by the [place] FDA and  has been authorized for detection and/or diagnosis of SARS-CoV-2 by FDA under an Emergency Use Authorization (EUA). This EUA will remain  in effect (meaning this test can be used) for the duration of the COVID-19 declaration under Section 564(b)(1) of the Act, 21 U.S.C.section 360bbb-3(b)(1), unless the authorization is terminated  or revoked sooner.       Influenza A by PCR NEGATIVE NEGATIVE Final   Influenza B by PCR NEGATIVE NEGATIVE Final    Comment: (NOTE) The Xpert Xpress SARS-CoV-2/FLU/RSV plus assay is intended as an aid in the diagnosis of influenza from Nasopharyngeal swab specimens and should not be used as a sole basis for treatment. Nasal washings and aspirates are unacceptable for Xpert Xpress SARS-CoV-2/FLU/RSV testing.  Fact Sheet for Patients: EntrepreneurPulse.com.au  Fact Sheet for Healthcare Providers: IncredibleEmployment.be  This test is not yet approved or cleared by the [place] FDA and has been authorized for detection and/or diagnosis of SARS-CoV-2 by FDA under an Emergency Use Authorization (EUA). This EUA will remain in effect (meaning this test can be used) for the duration of the COVID-19 declaration under Section 564(b)(1) of the Act, 21 U.S.C. section 360bbb-3(b)(1), unless the authorization is terminated or revoked.  Performed at [facility], [address] [place], [place] [address]      Scheduled Meds:  ARIPiprazole  7.5 mg Oral Daily   aspirin  325 mg Oral Daily   Chlorhexidine Gluconate Cloth  6 each Topical Daily   docusate sodium  100 mg Oral Daily   enoxaparin (LOVENOX) injection  40 mg Subcutaneous Q24H   feeding supplement  237 mL Oral TID BM   finasteride  5 mg Oral Daily   levothyroxine   137 mcg Oral QAC breakfast   lisinopril  20 mg Oral Daily   LORazepam  0.5 mg Oral BID WC   LORazepam  1 mg Oral QHS   PARoxetine  10 mg Oral Daily   polyethylene glycol  17 g Oral Daily   tamsulosin  0.4 mg Oral QPC breakfast   Vitamin D (Ergocalciferol)  50,000 Units Oral Q7 days   Continuous Infusions:  sodium chloride Stopped (08/28/21 1930)   0.9 % NaCl with KCl 20 mEq / L 75 mL/hr at 08/29/21 1223   cefTRIAXone (ROCEPHIN)  IV 1 g (08/28/21 1936)    Procedures/Studies: DG Chest 1 View  Result Date: 08/04/2021 CLINICAL DATA:  Weakness EXAM: CHEST  1 VIEW COMPARISON:  Chest x-ray 07/31/2021 FINDINGS: Heart size within normal limits. Mediastinum appears stable. Calcified plaques in the aortic arch. Subsegmental atelectatic changes in the lung bases. No focal consolidation identified. No significant pleural  effusion identified. No pneumothorax. IMPRESSION: Bibasilar subsegmental atelectasis. Electronically Signed   By: Ofilia Neas M.D.   On: 08/04/2021 15:22   CT HEAD WO CONTRAST  Result Date: 08/28/2021 CLINICAL DATA:  Weakness in right arm EXAM: CT HEAD WITHOUT CONTRAST TECHNIQUE: Contiguous axial images were obtained from the base of the skull through the vertex without intravenous contrast. RADIATION DOSE REDUCTION: This exam was performed according to the departmental dose-optimization program which includes automated exposure control, adjustment of the mA and/or kV according to patient size and/or use of iterative reconstruction technique. COMPARISON:  CT head 08/04/2021 FINDINGS: Brain: There is no evidence of acute intracranial hemorrhage, extra-axial fluid collection, or acute infarct. Parenchymal volume is within normal limits. The ventricles are stable in size. There is confluent hypodensity in the subcortical and periventricular white matter likely reflecting sequela of moderate chronic white matter microangiopathy, unchanged. There is no mass lesion.  There is no midline  shift. Vascular: There is calcification of the bilateral cavernous ICAs. Skull: Normal. Negative for fracture or focal lesion. Sinuses/Orbits: The imaged paranasal sinuses are clear. Bilateral lens implants are in place. The globes and orbits are otherwise unremarkable. Other: None. IMPRESSION: No acute intracranial pathology. No significant interval change since 08/04/2021. Electronically Signed   By: Valetta Mole M.D.   On: 08/28/2021 11:24   CT HEAD WO CONTRAST (5MM)  Result Date: 08/04/2021 CLINICAL DATA:  Altered mental status, nontraumatic. EXAM: CT HEAD WITHOUT CONTRAST TECHNIQUE: Contiguous axial images were obtained from the base of the skull through the vertex without intravenous contrast. COMPARISON:  None. FINDINGS: Brain: There is mild cortical atrophy, within normal limits for patient age. The ventricles are normal in configuration. The basilar cisterns are patent. No mass, mass effect, or midline shift. No acute intracranial hemorrhage is seen. No abnormal extra-axial fluid collection. Moderate periventricular and subcortical white matter hypodensities, nonspecific but most likely secondary to chronic ischemic white matter changes, within normal limits for patient age. 7 Preservation of the normal cortical gray-white interface without CT evidence of an acute major vascular territorial cortical based infarction. Vascular: No hyperdense vessel or unexpected calcification. Skull: Normal. Negative for fracture or focal lesion. Sinuses/Orbits: Status post bilateral intra-ocular lens replacements. Small anterior right maxillary sinus mucosal polyp. The mastoid air cells are clear. Other: None. IMPRESSION: Mild cortical atrophy and moderate chronic ischemic white matter changes, within normal limits for patient age. No acute intracranial process. Electronically Signed   By: Yvonne Kendall   On: 08/04/2021 15:28   DG Chest Port 1 View  Result Date: 07/31/2021 CLINICAL DATA:  Shortness of breath  starting this morning EXAM: PORTABLE CHEST 1 VIEW COMPARISON:  07/25/2021 FINDINGS: Stable low volume chest with interstitial coarsening at the bases. Normal heart size and mediastinal contours. Prior median sternotomy. IMPRESSION: Stable low volume chest with indistinct opacity at the bases. Electronically Signed   By: Jorje Guild M.D.   On: 07/31/2021 10:32   CT Renal Stone Study  Result Date: 08/04/2021 CLINICAL DATA:  Foreign body, GU tract oliguria. decreased urinary output and increased generalized weakness since being discharged recently for kidney stone Standard full stone protocol used EXAM: CT ABDOMEN AND PELVIS WITHOUT CONTRAST TECHNIQUE: Multidetector CT imaging of the abdomen and pelvis was performed following the standard protocol without IV contrast. COMPARISON:  None. FINDINGS: Lower chest: No acute abnormality. Three-vessel coronary artery calcification. Hepatobiliary: No focal liver abnormality. No gallstones, gallbladder wall thickening, or pericholecystic fluid. No biliary dilatation. Pancreas: No focal lesion. Normal pancreatic contour. No  surrounding inflammatory changes. No main pancreatic ductal dilatation. Spleen: Normal in size without focal abnormality. Adrenals/Urinary Tract: No adrenal nodule bilaterally. There is a 3 mm calcified stone within the left kidney. Likely punctate calcification within the right kidney. The other calcifications are likely vascular. No hydronephrosis. Bilateral renal cortical scarring. No definite contour-deforming renal mass. No ureterolithiasis or hydroureter. The urinary bladder is decompressed with a urinary bladder tip and balloon terminating within the lumen. Query slight perivesicular fat stranding and hazy urinary bladder wall contour. Stomach/Bowel: Stomach is within normal limits. No evidence of bowel wall thickening or dilatation. Appendix appears normal. Vascular/Lymphatic: No abdominal aorta or iliac aneurysm. Severe atherosclerotic plaque  of the aorta and its branches. No abdominal, pelvic, or inguinal lymphadenopathy. Reproductive: Prostatectomy. Other: No intraperitoneal free fluid. No intraperitoneal free gas. No organized fluid collection. Musculoskeletal: Bilateral fat containing small inguinal hernias. No suspicious lytic or blastic osseous lesions. No acute displaced fracture. Multilevel severe degenerative changes of the spine. Diffusely decreased bone density. Grade 1 anterolisthesis of L4 on L5. L3-L5 posterolateral and interbody fusion. No pedicular screw at the L4 level on the left. IMPRESSION: 1. Query slight perivesicular fat stranding and hazy urinary bladder wall contour. Markedly limited evaluation due to collapse of the urinary bladder with Foley catheter in place. Recommend correlation with urinalysis for infection. 2. Nonobstructive bilateral nephrolithiasis, 3 mm on the left and punctate on the right. 3.  Bilateral fat containing small inguinal hernias. 4.  Aortic Atherosclerosis (ICD10-I70.0). Electronically Signed   By: Iven Finn M.D.   On: 08/04/2021 15:29    Orson Eva, DO  [facility]  If 7PM-7AM, please contact night-coverage [email or web address] Password [email or web address] 08/29/2021, 4:09 PM   LOS: 1 day

## 2021-08-29 NOTE — Progress Notes (Signed)
Patient still have some periods of agitation that wax and wanes after the administration of ativan . Mittens placed/ provider made aware of the inability for RN/staff to keep the patients tele on due to patient ripping tele off continually when re-placed. No additional orders given . I continue to monitor the patient closely for safety in hopes of avoid falls or possible injury. Private sitter remains @ bedside

## 2021-08-29 NOTE — Progress Notes (Addendum)
Patient combative/agitated  with care. Attempting to pull pump over, have ripped off tele on numerous occasions. Making great attempts to climb out of bed by flipping his legs over the rails. He is at significant risk for falls based on his clinical baseline. On call provider paged via amion and informed of the above behaviors. All fall safety measures are currently in effect ( bed-alam, bed -lowest position, personal safety sitter at bedside, room close to nursing station. Per patients private sitter he has PTSD and at times can become quite " violent" awaiting orders

## 2021-08-29 NOTE — Progress Notes (Signed)
Patient extremely agitated and combative. Lorazepam (Ativan) Injection 1 mg administered to patient per PRN order.

## 2021-08-29 NOTE — Progress Notes (Signed)
°  Transition of Care [facility]) Screening Note   Patient Details  Name: Patrick Kerr Date of Birth: April 24, 1946   Transition of Care [facility]) CM/SW Contact:    Villa Herb, LCSWA Phone Number: 08/29/2021, 11:22 AM  [facility] notified [facility] pts admission to hospital. [facility] notification ID is [id number].  Transition of Care Department [facility]) has reviewed patient and no TOC needs have been identified at this time. We will continue to monitor patient advancement through interdisciplinary progression rounds. If new patient transition needs arise, please place a TOC consult.

## 2021-08-30 ENCOUNTER — Ambulatory Visit: Payer: No Typology Code available for payment source | Admitting: Physician Assistant

## 2021-08-30 ENCOUNTER — Inpatient Hospital Stay (HOSPITAL_COMMUNITY): Payer: No Typology Code available for payment source

## 2021-08-30 LAB — BASIC METABOLIC PANEL
Anion gap: 5 (ref 5–15)
BUN: 10 mg/dL (ref 8–23)
CO2: 27 mmol/L (ref 22–32)
Calcium: 9 mg/dL (ref 8.9–10.3)
Chloride: 109 mmol/L (ref 98–111)
Creatinine, Ser: 0.79 mg/dL (ref 0.61–1.24)
GFR, Estimated: >60 mL/min (ref >60)
Glucose, Bld: 101 mg/dL — ABNORMAL HIGH (ref 70–99)
Potassium: 3.8 mmol/L (ref 3.5–5.1)
Sodium: 141 mmol/L (ref 135–145)

## 2021-08-30 LAB — CBC
HCT: 36.7 % — ABNORMAL LOW (ref 39.0–52.0)
Hemoglobin: 11.6 g/dL — ABNORMAL LOW (ref 13.0–17.0)
MCH: 31.9 pg (ref 26.0–34.0)
MCHC: 31.6 g/dL (ref 30.0–36.0)
MCV: 100.8 fL — ABNORMAL HIGH (ref 80.0–100.0)
Platelets: 138 10*3/uL — ABNORMAL LOW (ref 150–400)
RBC: 3.64 MIL/uL — ABNORMAL LOW (ref 4.22–5.81)
RDW: 12.4 % (ref 11.5–15.5)
WBC: 5 10*3/uL (ref 4.0–10.5)
nRBC: 0 % (ref 0.0–0.2)

## 2021-08-30 LAB — FOLATE: Folate: 5.5 ng/mL — ABNORMAL LOW (ref >5.9)

## 2021-08-30 LAB — TSH: TSH: 1.116 u[IU]/mL (ref 0.350–4.500)

## 2021-08-30 LAB — MAGNESIUM: Magnesium: 1.9 mg/dL (ref 1.7–2.4)

## 2021-08-30 LAB — VITAMIN B12: Vitamin B-12: 393 pg/mL (ref 180–914)

## 2021-08-30 MED ORDER — FOLIC ACID 1 MG PO TABS
1.0000 mg | ORAL_TABLET | Freq: Every day | ORAL | Status: DC
  Administered 2021-08-30 – 2021-09-06 (×7): 1 mg via ORAL
  Filled 2021-08-30 (×6): qty 1

## 2021-08-30 MED ORDER — SODIUM CHLORIDE 0.9 % IV SOLN
2.0000 g | Freq: Three times a day (TID) | INTRAVENOUS | Status: DC
  Administered 2021-08-30 – 2021-09-02 (×8): 2 g via INTRAVENOUS
  Filled 2021-08-30 (×8): qty 2

## 2021-08-30 MED ORDER — HALOPERIDOL LACTATE 5 MG/ML IJ SOLN
5.0000 mg | Freq: Four times a day (QID) | INTRAMUSCULAR | Status: DC | PRN
  Administered 2021-08-30 – 2021-08-31 (×2): 5 mg via INTRAVENOUS
  Filled 2021-08-30: qty 1

## 2021-08-30 MED ORDER — NYSTATIN 100000 UNIT/GM EX POWD
Freq: Two times a day (BID) | CUTANEOUS | Status: DC
  Filled 2021-08-30 (×3): qty 15

## 2021-08-30 MED ORDER — NYSTATIN 100000 UNIT/GM EX POWD
CUTANEOUS | Status: AC
  Filled 2021-08-30: qty 15

## 2021-08-30 NOTE — Progress Notes (Signed)
EEG attempted. Patient was given Haldol to try and relax patient but it had little to no success. Patient remained combative and highly agitated with anyone that tried to touch him. Dr. Arbutus Leas was notified of this.

## 2021-08-30 NOTE — Progress Notes (Signed)
PROGRESS NOTE  [name]:[id number] DOB: [birth year] DOA: 08/28/2021 PCP: Ludwig Clarks, FNP Brief History:  76 y.o. male with medical history of dementia, BOO, BPH s/p TURP, chronic indwelling Foley catheter, hypothyroidism, hypertension, PTSD presenting with altered mental status.  The patient was recently hospitalized from 07/17/2021 to 07/29/2021 for septic shock due to Klebsiella UTI.  During that hospitalization the patient required intubation.  There was also a question of serotonin syndrome resulting in discontinuation of his Abilify and Zoloft.  The patient was discharged home with home health physical therapy.  The patient returned to the emergency department once again on 08/04/2021 with generalized weakness and irritability.  He was seen by the hospitalist at that time who felt that there was no new acute issue and this most likely is continued natural recovery from his recent prolonged hospitalization.  Once again, the patient was hospitalized at [facility] from 08/18/2021 to 08/21/2021 for agitation, confusion, and aggressive behavior.  It was felt that this was due to the patient's electrolyte derangement, UTI, and being off his psychiatric medications.  He was seen by psychiatry at that time who restarted the patient's Abilify at 7.5 mg daily.  Ativan was given 0.5 mg twice daily during the day and 1 mg at bedtime. The patient's daughter states that since returning home from [place] [facility], the patient has been doing fairly well up until the morning of 08/28/2021.  After breakfast, she noted that the patient was somewhat confused.  Caretaker stated that patient was speaking nonsensically and kept repeating the same words.  In addition, the patient was doing nonsensical things with his right arm.  Care taker stated that he was not particularly having any focal weakness, but which is grabbing at things that were not there and also grabbing at objects for no  reason.  There was no loss of consciousness.  She feel patient may have been incited by something his spouse said.  EMS was activated, and she stated that although he was a little bit more lucid he still remained somewhat confused in the emergency department.  In fact, at the time of my evaluation, the patient still have difficulty recognizing the patient's daughter, but he was able to speak clearly.  She states that he last had his Foley catheter changed on 08/23/2021. There is been no reports of headache, chest pain, short of breath, cough, hemoptysis, nausea, vomiting without abdominal pain. ED Patient was afebrile hemodynamically stable with oxygen saturation 97% room air.  BMP showed a sodium 135, potassium 4.2, chloride 104, bicarbonate 29, BUN 12, creatinine 0.9.  AST 19, ALT 14, alk phosphatase 50, total bilirubin 0.4.  WBC 5.2, hemoglobin 1.9, platelets 140,000.  CT of the brain was negative for any acute findings.  UA showed 21-50 WBC.  UDS was negative.     Assessment/Plan: Acute metabolic encephalopathy -Secondary to UTI -08/29/21 serum B12--393 -07/17/2021 TSH--0.053 -UA 21-50WBC -Follow urine culture>>pseudomonas -1/29--pleasantly confused, having sundowning episode -EEG--unable to obtain due to patient's uncooperativeness -MRI brain--neg for stroke  Depression/PTSD -Restart Abilify 7.5 mg daily, Paxil 10 mg daily -continue ativan 0.5 mg am and lunch, 73m hs -ativan prn agitation  Pseudomonas UTI -d/c ceftriaxone -start cefepime   BPH status post TURP -Continue finasteride and Rapaflo -d/c foley for voiding trial   Hypothyroidism -Continue Synthroid   Hypertension -Continue lisinopril   Major Neurocognitive Disorder -has had propensity for agitation   Obesity -BMI 31.65 -lifestyle modification  Low Folate -replete             Family Communication:   daughter updated 1/30   Consultants:  none   Code Status:  FULL    DVT Prophylaxis:  [place] Lovenox      Procedures: As Listed in Progress Note Above   Antibiotics: Ceftriaxone 1/28>>1/30 Cefepime 1/30>>           Subjective: Patient denies fevers, chills, headache, chest pain, dyspnea, nausea, vomiting, diarrhea, abdominal pain, dysuria, hematuria,    Objective: Vitals:   08/29/21 1444 08/29/21 2101 08/30/21 0544 08/30/21 1329  BP: (!) 153/75 (!) 165/85 129/71 (!) 147/72  Pulse: 81 99 84 78  Resp: 18 18 19 18   Temp: 97.7 F (36.5 C) 98 F (36.7 C) 98.2 F (36.8 C) 97.6 F (36.4 C)  TempSrc: Oral Oral Oral Oral  SpO2: 96% 96% 93% 96%  Weight:      Height:        Intake/Output Summary (Last 24 hours) at 08/30/2021 1713 Last data filed at 08/30/2021 1500 Gross per 24 hour  Intake 886.05 ml  Output 450 ml  Net 436.05 ml   Weight change:  Exam:  General:  Pt is alert, follows commands appropriately, not in acute distress HEENT: No icterus, No thrush, No neck mass, [place]/AT Cardiovascular: RRR, S1/S2, no rubs, no gallops Respiratory: bibasilar rales. No wheeze Abdomen: Soft/+BS, non tender, non distended, no guarding Extremities: No edema, No lymphangitis, No petechiae, No rashes, no synovitis   Data Reviewed: I have personally reviewed following labs and imaging studies Basic Metabolic Panel: Recent Labs  Lab 08/28/21 1049 08/28/21 1133 08/29/21 0431 08/30/21 0428  NA 135 139 138 141  K 4.2 4.3 3.7 3.8  CL 104 102 107 109  CO2 29  --  25 27  GLUCOSE 117* 112* 92 101*  BUN 12 11 11 10   CREATININE 0.89 0.70 0.75 0.79  CALCIUM 8.6*  --  8.7* 9.0  MG  --   --   --  1.9   Liver Function Tests: Recent Labs  Lab 08/28/21 1049  AST 19  ALT 14  ALKPHOS 50  BILITOT 0.4  PROT 6.9  ALBUMIN 3.1*   No results for input(s): LIPASE, AMYLASE in the last 168 hours. No results for input(s): AMMONIA in the last 168 hours. Coagulation Profile: Recent Labs  Lab 08/28/21 1049  INR 1.2   CBC: Recent Labs  Lab 08/28/21 1049 08/28/21 1133 08/29/21 0431  08/30/21 0428  WBC 5.2  --  4.7 5.0  NEUTROABS 3.7  --   --   --   HGB 11.9* 12.6* 10.9* 11.6*  HCT 36.5* 37.0* 34.0* 36.7*  MCV 102.5*  --  103.7* 100.8*  PLT 140*  --  128* 138*   Cardiac Enzymes: No results for input(s): CKTOTAL, CKMB, CKMBINDEX, TROPONINI in the last 168 hours. BNP: Invalid input(s): POCBNP CBG: No results for input(s): GLUCAP in the last 168 hours. HbA1C: No results for input(s): HGBA1C in the last 72 hours. Urine analysis:    Component Value Date/Time   COLORURINE YELLOW 08/28/2021 [place] 08/28/2021 1219   LABSPEC 1.010 08/28/2021 1219   PHURINE 7.0 08/28/2021 1219   GLUCOSEU NEGATIVE 08/28/2021 1219   HGBUR NEGATIVE 08/28/2021 1219   [place] 08/28/2021 [place] 08/28/2021 1219   PROTEINUR NEGATIVE 08/28/2021 1219   NITRITE POSITIVE (A) 08/28/2021 1219   LEUKOCYTESUR TRACE (A) 08/28/2021 1219   Sepsis Labs: @LABRCNTIP (procalcitonin:4,lacticidven:4) )  Recent Results (from the past 240 hour(s))  Resp Panel by RT-PCR (Flu A&B, Covid) Nasopharyngeal Swab     Status: None   Collection Time: 08/28/21 11:17 AM   Specimen: Nasopharyngeal Swab; Nasopharyngeal(NP) swabs in vial transport medium  Result Value Ref Range Status   SARS Coronavirus 2 by RT PCR NEGATIVE NEGATIVE Final    Comment: (NOTE) SARS-CoV-2 target nucleic acids are NOT DETECTED.  The SARS-CoV-2 RNA is generally detectable in upper respiratory specimens during the acute phase of infection. The lowest concentration of SARS-CoV-2 viral copies this assay can detect is 138 copies/mL. A negative result does not preclude SARS-Cov-2 infection and should not be used as the sole basis for treatment or other patient management decisions. A negative result may occur with  improper specimen collection/handling, submission of specimen other than nasopharyngeal swab, presence of viral mutation(s) within the areas targeted by this assay, and inadequate  number of viral copies(<138 copies/mL). A negative result must be combined with clinical observations, patient history, and epidemiological information. The expected result is Negative.  Fact Sheet for Patients:  EntrepreneurPulse.com.au  Fact Sheet for Healthcare Providers:  IncredibleEmployment.be  This test is no t yet approved or cleared by the [place] FDA and  has been authorized for detection and/or diagnosis of SARS-CoV-2 by FDA under an Emergency Use Authorization (EUA). This EUA will remain  in effect (meaning this test can be used) for the duration of the COVID-19 declaration under Section 564(b)(1) of the Act, 21 U.S.C.section 360bbb-3(b)(1), unless the authorization is terminated  or revoked sooner.       Influenza A by PCR NEGATIVE NEGATIVE Final   Influenza B by PCR NEGATIVE NEGATIVE Final    Comment: (NOTE) The Xpert Xpress SARS-CoV-2/FLU/RSV plus assay is intended as an aid in the diagnosis of influenza from Nasopharyngeal swab specimens and should not be used as a sole basis for treatment. Nasal washings and aspirates are unacceptable for Xpert Xpress SARS-CoV-2/FLU/RSV testing.  Fact Sheet for Patients: EntrepreneurPulse.com.au  Fact Sheet for Healthcare Providers: IncredibleEmployment.be  This test is not yet approved or cleared by the [place] FDA and has been authorized for detection and/or diagnosis of SARS-CoV-2 by FDA under an Emergency Use Authorization (EUA). This EUA will remain in effect (meaning this test can be used) for the duration of the COVID-19 declaration under Section 564(b)(1) of the Act, 21 U.S.C. section 360bbb-3(b)(1), unless the authorization is terminated or revoked.  Performed at [facility], [address] [place], [place] [address]   Urine Culture     Status: Abnormal (Preliminary result)   Collection Time: 08/28/21 12:32 PM   Specimen: Urine,  Catheterized  Result Value Ref Range Status   Specimen Description   Final    URINE, CATHETERIZED Performed at [facility], [address], [place], [place] [address]    Special Requests   Final    NONE Performed at [facility], [address] [place], [place] [address]    Culture (A)  Final    >=100,000 COLONIES/mL PSEUDOMONAS AERUGINOSA SUSCEPTIBILITIES TO FOLLOW Performed at [facility], [place] [address] [place], [place] [address]    Report Status PENDING  Incomplete     Scheduled Meds:  ARIPiprazole  7.5 mg Oral Daily   aspirin  325 mg Oral Daily   Chlorhexidine Gluconate Cloth  6 each Topical Daily   docusate sodium  100 mg Oral Daily   enoxaparin (LOVENOX) injection  40 mg Subcutaneous Q24H   feeding supplement  237  mL Oral TID BM   finasteride  5 mg Oral Daily   folic acid  1 mg Oral Daily   levothyroxine  137 mcg Oral QAC breakfast   lisinopril  20 mg Oral Daily   LORazepam  0.5 mg Oral BID WC   LORazepam  1 mg Oral QHS   PARoxetine  10 mg Oral Daily   polyethylene glycol  17 g Oral Daily   tamsulosin  0.4 mg Oral QPC breakfast   Vitamin D (Ergocalciferol)  50,000 Units Oral Q7 days   Continuous Infusions:  sodium chloride Stopped (08/28/21 1930)   cefTRIAXone (ROCEPHIN)  IV 1 g (08/29/21 1659)    Procedures/Studies: DG Chest 1 View  Result Date: 08/04/2021 CLINICAL DATA:  Weakness EXAM: CHEST  1 VIEW COMPARISON:  Chest x-ray 07/31/2021 FINDINGS: Heart size within normal limits. Mediastinum appears stable. Calcified plaques in the aortic arch. Subsegmental atelectatic changes in the lung bases. No focal consolidation identified. No significant pleural effusion identified. No pneumothorax. IMPRESSION: Bibasilar subsegmental atelectasis. Electronically Signed   By: Ofilia Neas M.D.   On: 08/04/2021 15:22   CT HEAD WO CONTRAST  Result Date: 08/28/2021 CLINICAL DATA:  Weakness in right arm EXAM: CT HEAD WITHOUT CONTRAST TECHNIQUE: Contiguous axial  images were obtained from the base of the skull through the vertex without intravenous contrast. RADIATION DOSE REDUCTION: This exam was performed according to the departmental dose-optimization program which includes automated exposure control, adjustment of the mA and/or kV according to patient size and/or use of iterative reconstruction technique. COMPARISON:  CT head 08/04/2021 FINDINGS: Brain: There is no evidence of acute intracranial hemorrhage, extra-axial fluid collection, or acute infarct. Parenchymal volume is within normal limits. The ventricles are stable in size. There is confluent hypodensity in the subcortical and periventricular white matter likely reflecting sequela of moderate chronic white matter microangiopathy, unchanged. There is no mass lesion.  There is no midline shift. Vascular: There is calcification of the bilateral cavernous ICAs. Skull: Normal. Negative for fracture or focal lesion. Sinuses/Orbits: The imaged paranasal sinuses are clear. Bilateral lens implants are in place. The globes and orbits are otherwise unremarkable. Other: None. IMPRESSION: No acute intracranial pathology. No significant interval change since 08/04/2021. Electronically Signed   By: Valetta Mole M.D.   On: 08/28/2021 11:24   CT HEAD WO CONTRAST (5MM)  Result Date: 08/04/2021 CLINICAL DATA:  Altered mental status, nontraumatic. EXAM: CT HEAD WITHOUT CONTRAST TECHNIQUE: Contiguous axial images were obtained from the base of the skull through the vertex without intravenous contrast. COMPARISON:  None. FINDINGS: Brain: There is mild cortical atrophy, within normal limits for patient age. The ventricles are normal in configuration. The basilar cisterns are patent. No mass, mass effect, or midline shift. No acute intracranial hemorrhage is seen. No abnormal extra-axial fluid collection. Moderate periventricular and subcortical white matter hypodensities, nonspecific but most likely secondary to chronic ischemic  white matter changes, within normal limits for patient age. 7 Preservation of the normal cortical gray-white interface without CT evidence of an acute major vascular territorial cortical based infarction. Vascular: No hyperdense vessel or unexpected calcification. Skull: Normal. Negative for fracture or focal lesion. Sinuses/Orbits: Status post bilateral intra-ocular lens replacements. Small anterior right maxillary sinus mucosal polyp. The mastoid air cells are clear. Other: None. IMPRESSION: Mild cortical atrophy and moderate chronic ischemic white matter changes, within normal limits for patient age. No acute intracranial process. Electronically Signed   By: Yvonne Kendall   On: 08/04/2021 15:28   MR BRAIN WO  CONTRAST  Result Date: 08/30/2021 CLINICAL DATA:  Mental status change, unknown cause. Weakness and confusion. EXAM: MRI HEAD WITHOUT CONTRAST TECHNIQUE: Multiplanar, multiecho pulse sequences of the brain and surrounding structures were obtained without intravenous contrast. COMPARISON:  Head CT 08/28/2021 FINDINGS: Brain: Diffusion imaging does not show any acute or subacute infarction. Chronic small-vessel ischemic changes affect pons. 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 Advanced chronic small-vessel ischemic changes are present throughout the cerebral hemispheric white matter. No cortical or large vessel territory infarction. No mass lesion, hemorrhage, hydrocephalus or extra-axial collection. Vascular: Major vessels at the base of the brain show flow. Skull and upper cervical spine: Negative Sinuses/Orbits: Clear/normal Other: None IMPRESSION: No acute MR finding. Chronic small-vessel ischemic changes affecting the pons and cerebral hemispheric white matter. Electronically Signed   By: Nelson Chimes M.D.   On: 08/30/2021 12:56   CT Renal Stone Study  Result Date: 08/04/2021 CLINICAL DATA:  Foreign body, GU tract oliguria. decreased urinary output and increased generalized weakness  since being discharged recently for kidney stone Standard full stone protocol used EXAM: CT ABDOMEN AND PELVIS WITHOUT CONTRAST TECHNIQUE: Multidetector CT imaging of the abdomen and pelvis was performed following the standard protocol without IV contrast. COMPARISON:  None. FINDINGS: Lower chest: No acute abnormality. Three-vessel coronary artery calcification. Hepatobiliary: No focal liver abnormality. No gallstones, gallbladder wall thickening, or pericholecystic fluid. No biliary dilatation. Pancreas: No focal lesion. Normal pancreatic contour. No surrounding inflammatory changes. No main pancreatic ductal dilatation. Spleen: Normal in size without focal abnormality. Adrenals/Urinary Tract: No adrenal nodule bilaterally. There is a 3 mm calcified stone within the left kidney. Likely punctate calcification within the right kidney. The other calcifications are likely vascular. No hydronephrosis. Bilateral renal cortical scarring. No definite contour-deforming renal mass. No ureterolithiasis or hydroureter. The urinary bladder is decompressed with a urinary bladder tip and balloon terminating within the lumen. Query slight perivesicular fat stranding and hazy urinary bladder wall contour. Stomach/Bowel: Stomach is within normal limits. No evidence of bowel wall thickening or dilatation. Appendix appears normal. Vascular/Lymphatic: No abdominal aorta or iliac aneurysm. Severe atherosclerotic plaque of the aorta and its branches. No abdominal, pelvic, or inguinal lymphadenopathy. Reproductive: Prostatectomy. Other: No intraperitoneal free fluid. No intraperitoneal free gas. No organized fluid collection. Musculoskeletal: Bilateral fat containing small inguinal hernias. No suspicious lytic or blastic osseous lesions. No acute displaced fracture. Multilevel severe degenerative changes of the spine. Diffusely decreased bone density. Grade 1 anterolisthesis of L4 on L5. L3-L5 posterolateral and interbody fusion. No  pedicular screw at the L4 level on the left. IMPRESSION: 1. Query slight perivesicular fat stranding and hazy urinary bladder wall contour. Markedly limited evaluation due to collapse of the urinary bladder with Foley catheter in place. Recommend correlation with urinalysis for infection. 2. Nonobstructive bilateral nephrolithiasis, 3 mm on the left and punctate on the right. 3.  Bilateral fat containing small inguinal hernias. 4.  Aortic Atherosclerosis (ICD10-I70.0). Electronically Signed   By: Iven Finn M.D.   On: 08/04/2021 15:29    Orson Eva, DO  [facility]  If 7PM-7AM, please contact night-coverage [email or web address] Password [email or web address] 08/30/2021, 5:13 PM   LOS: 2 days

## 2021-08-30 NOTE — Progress Notes (Addendum)
Has been sleeping since given haldol and ativan at [place] last night. Private sitter remains at bedside.

## 2021-08-31 ENCOUNTER — Inpatient Hospital Stay (HOSPITAL_COMMUNITY)
Admit: 2021-08-31 | Discharge: 2021-08-31 | Disposition: A | Discharge status: Home / Self Care | Payer: No Typology Code available for payment source | Attending: Internal Medicine | Admitting: Internal Medicine

## 2021-08-31 ENCOUNTER — Inpatient Hospital Stay (HOSPITAL_COMMUNITY): Payer: No Typology Code available for payment source

## 2021-08-31 ENCOUNTER — Ambulatory Visit: Payer: No Typology Code available for payment source | Admitting: Physician Assistant

## 2021-08-31 DIAGNOSIS — R55 Syncope and collapse: Secondary | ICD-10-CM | POA: Diagnosis not present

## 2021-08-31 LAB — CBC
HCT: 35.6 % — ABNORMAL LOW (ref 39.0–52.0)
Hemoglobin: 11.4 g/dL — ABNORMAL LOW (ref 13.0–17.0)
MCH: 31.8 pg (ref 26.0–34.0)
MCHC: 32 g/dL (ref 30.0–36.0)
MCV: 99.4 fL (ref 80.0–100.0)
Platelets: 156 10*3/uL (ref 150–400)
RBC: 3.58 MIL/uL — ABNORMAL LOW (ref 4.22–5.81)
RDW: 12.3 % (ref 11.5–15.5)
WBC: 5 10*3/uL (ref 4.0–10.5)
nRBC: 0 % (ref 0.0–0.2)

## 2021-08-31 LAB — COMPREHENSIVE METABOLIC PANEL
ALT: 14 U/L (ref 0–44)
AST: 20 U/L (ref 15–41)
Albumin: 3.3 g/dL — ABNORMAL LOW (ref 3.5–5.0)
Alkaline Phosphatase: 50 U/L (ref 38–126)
Anion gap: 7 (ref 5–15)
BUN: 14 mg/dL (ref 8–23)
CO2: 23 mmol/L (ref 22–32)
Calcium: 9 mg/dL (ref 8.9–10.3)
Chloride: 108 mmol/L (ref 98–111)
Creatinine, Ser: 0.93 mg/dL (ref 0.61–1.24)
GFR, Estimated: >60 mL/min (ref >60)
Glucose, Bld: 144 mg/dL — ABNORMAL HIGH (ref 70–99)
Potassium: 3.4 mmol/L — ABNORMAL LOW (ref 3.5–5.1)
Sodium: 138 mmol/L (ref 135–145)
Total Bilirubin: 0.8 mg/dL (ref 0.3–1.2)
Total Protein: 7 g/dL (ref 6.5–8.1)

## 2021-08-31 LAB — BASIC METABOLIC PANEL
Anion gap: 8 (ref 5–15)
BUN: 14 mg/dL (ref 8–23)
CO2: 24 mmol/L (ref 22–32)
Calcium: 9.2 mg/dL (ref 8.9–10.3)
Chloride: 107 mmol/L (ref 98–111)
Creatinine, Ser: 0.88 mg/dL (ref 0.61–1.24)
GFR, Estimated: >60 mL/min (ref >60)
Glucose, Bld: 108 mg/dL — ABNORMAL HIGH (ref 70–99)
Potassium: 3.6 mmol/L (ref 3.5–5.1)
Sodium: 139 mmol/L (ref 135–145)

## 2021-08-31 LAB — LACTIC ACID, PLASMA
Lactic Acid, Venous: 2.3 mmol/L (ref 0.5–1.9)
Lactic Acid, Venous: 1.1 mmol/L (ref 0.5–1.9)

## 2021-08-31 LAB — ECHOCARDIOGRAM COMPLETE
AR max vel: 3 cm2
AV Area VTI: 3.83 cm2
AV Area mean vel: 3.56 cm2
AV Mean grad: 3 mmHg
AV Peak grad: 6.6 mmHg
Ao pk vel: 1.28 m/s
Area-P 1/2: 5.79 cm2
Height: 76 in
MV VTI: 4.31 cm2
S' Lateral: 3.6 cm
Weight: 4159.99 oz

## 2021-08-31 LAB — BLOOD GAS, ARTERIAL
Acid-base deficit: 1.4 mmol/L (ref 0.0–2.0)
Bicarbonate: 23.4 mmol/L (ref 20.0–28.0)
Drawn by: 41977
FIO2: 28
O2 Saturation: 97 %
Patient temperature: 37
pCO2 arterial: 36.2 mmHg (ref 32.0–48.0)
pH, Arterial: 7.411 (ref 7.350–7.450)
pO2, Arterial: 99 mmHg (ref 83.0–108.0)

## 2021-08-31 LAB — URINE CULTURE: Culture: >=100000 — AB

## 2021-08-31 LAB — TROPONIN I (HIGH SENSITIVITY)
Troponin I (High Sensitivity): 3 ng/L (ref <18)
Troponin I (High Sensitivity): 4 ng/L (ref <18)

## 2021-08-31 LAB — GLUCOSE, CAPILLARY: Glucose-Capillary: 129 mg/dL — ABNORMAL HIGH (ref 70–99)

## 2021-08-31 LAB — MAGNESIUM: Magnesium: 1.9 mg/dL (ref 1.7–2.4)

## 2021-08-31 LAB — CK: Total CK: 46 U/L — ABNORMAL LOW (ref 49–397)

## 2021-08-31 MED ORDER — POTASSIUM CHLORIDE CRYS ER 20 MEQ PO TBCR
40.0000 meq | EXTENDED_RELEASE_TABLET | Freq: Once | ORAL | Status: AC
  Administered 2021-08-31: 40 meq via ORAL
  Filled 2021-08-31: qty 2

## 2021-08-31 MED ORDER — SODIUM CHLORIDE 0.9 % IV SOLN: INTRAVENOUS | Status: DC

## 2021-08-31 MED ORDER — SODIUM CHLORIDE 0.9 % IV BOLUS
1000.0000 mL | Freq: Once | INTRAVENOUS | Status: AC
  Administered 2021-08-31: 1000 mL via INTRAVENOUS

## 2021-08-31 MED ORDER — TRIAMCINOLONE ACETONIDE 0.1 % EX CREA
TOPICAL_CREAM | Freq: Two times a day (BID) | CUTANEOUS | Status: AC
  Filled 2021-08-31: qty 15

## 2021-08-31 MED ORDER — MELATONIN 3 MG PO TABS
9.0000 mg | ORAL_TABLET | Freq: Once | ORAL | Status: AC
  Administered 2021-09-01: 9 mg via ORAL
  Filled 2021-08-31: qty 3

## 2021-08-31 NOTE — Progress Notes (Signed)
Performed I&O cath per sterile protocol and physician order. of clear, yellow urine. Patient tolerated well.

## 2021-08-31 NOTE — Progress Notes (Signed)
asked Dr. Arbutus Leas for order for foley which we have. Patient had been retaining about 500 mls of urine. Before we could place foley patient urinated a considerable about of urine which soaked the bed and all pads and sheets. Instead of placing foley we decided to try condom cath first and if needed patient could have foley placed tonight.

## 2021-08-31 NOTE — Progress Notes (Signed)
EEG complete - results pending 

## 2021-08-31 NOTE — Progress Notes (Signed)
Date and time results received: 08/31/21 0857 (use smartphrase ".now" to insert current time)  Test: Lactic Acid Critical Value: 2.9  Name of Provider Notified: Dr Tat  Orders Received? Or Actions Taken?:  Patient currently getting NS bolus

## 2021-08-31 NOTE — Progress Notes (Signed)
Dr. Ernestina Columbia notified pt has been unable to void since catheter was removed. Bladder scanner is reading . Patient has been up to [facility] with assistance x2 and unable to void.

## 2021-08-31 NOTE — Progress Notes (Signed)
Per RN patient will be moving to the ICU - will attempt EEG at later time as schedule permits.

## 2021-08-31 NOTE — Progress Notes (Signed)
Patient transported to step-down.  Patient was up and out of bed being combative this morning during shift report. Then nurse tech, Shanda Bumps helped patient to bathroom. When she came back to room to get back to bed she stated he was not responding to her and he was in a "daze" pt.'s O2 sat was 89% on room air HR: 112 and B/P was 70/40 (manual). My team was able to get him back in bed by lifting him. EKG and ABG were done. Patient became a little more responsive to Dr. Arbutus Leas. O2 sat came up to 100% on 3L [place] and B/P 130's/60's but then dropped again while in bed to 80's/40's. NS 1000 ml bolus started. Patient then transferred to step-down and report given to [facility], RN.

## 2021-08-31 NOTE — Progress Notes (Incomplete)
*  PRELIMINARY RESULTS* Echocardiogram 2D Echocardiogram has been performed.  [name] 08/31/2021, 2:14 PM

## 2021-08-31 NOTE — Progress Notes (Addendum)
PROGRESS NOTE  Patrick Kerr OIT:[id number] DOB: November 16, 1945 DOA: 08/28/2021 PCP: Ludwig Clarks, FNP  Brief History:  76 y.o. male with medical history of dementia, BOO, BPH s/p TURP, chronic indwelling Foley catheter, hypothyroidism, hypertension, PTSD presenting with altered mental status.  The patient was recently hospitalized from 07/17/2021 to 07/29/2021 for septic shock due to Klebsiella UTI.  During that hospitalization the patient required intubation.  There was also a question of serotonin syndrome resulting in discontinuation of his Abilify and Zoloft.  The patient was discharged home with home health physical therapy.  The patient returned to the emergency department once again on 08/04/2021 with generalized weakness and irritability.  He was seen by the hospitalist at that time who felt that there was no new acute issue and this most likely is continued natural recovery from his recent prolonged hospitalization.  Once again, the patient was hospitalized at [facility] from 08/18/2021 to 08/21/2021 for agitation, confusion, and aggressive behavior.  It was felt that this was due to the patient's electrolyte derangement, UTI, and being off his psychiatric medications.  He was seen by psychiatry at that time who restarted the patient's Abilify at 7.5 mg daily.  Ativan was given 0.5 mg twice daily during the day and 1 mg at bedtime. The patient's daughter states that since returning home from [facility], the patient has been doing fairly well up until the morning of 08/28/2021.  After breakfast, she noted that the patient was somewhat confused.  Caretaker stated that patient was speaking nonsensically and kept repeating the same words.  In addition, the patient was doing nonsensical things with his right arm.  Care taker stated that he was not particularly having any focal weakness, but which is grabbing at things that were not there and also grabbing at objects for no  reason.  There was no loss of consciousness.  She feel patient may have been incited by something his spouse said.  EMS was activated, and she stated that although he was a little bit more lucid he still remained somewhat confused in the emergency department.  In fact, at the time of my evaluation, the patient still have difficulty recognizing the patient's daughter, but he was able to speak clearly.  She states that he last had his Foley catheter changed on 08/23/2021. There is been no reports of headache, chest pain, short of breath, cough, hemoptysis, nausea, vomiting without abdominal pain. ED Patient was afebrile hemodynamically stable with oxygen saturation 97% room air.  BMP showed a sodium 135, potassium 4.2, chloride 104, bicarbonate 29, BUN 12, creatinine 0.9.  AST 19, ALT 14, alk phosphatase 50, total bilirubin 0.4.  WBC 5.2, hemoglobin 1.9, platelets 140,000.  CT of the brain was negative for any acute findings.  UA showed 21-50 WBC.  UDS was negative.  RAPID RESPONSE CALLED 08/31/21 AM -patient walked to bathroom and sat on commode.  NT went to check up on patient and was trying to get pt up when he became minimally responsive.  ("Eyes rolled back").  I arrived as staff was getting patient back to bed.  Patient awakens to tactile stimuli but not following commands or answering questions CBG 129, initial BP 60/41, 99% on 3L. About 10 min after syncope, patient following commands and answers questions.  Repeat vitals 5-10 min after event--HR 80, RR18--137/77, 99% on 3L     Assessment/Plan: Acute metabolic encephalopathy -Secondary to UTI -08/29/21 serum B12--393 -07/17/2021  TSH--0.053 -UA 21-50WBC -Follow urine culture>>pseudomonas -1/29--pleasantly confused, having sundowning episode -EEG--unable to obtain due to patient's uncooperativeness -1/30 MRI brain--neg for stroke, no acute findings -overall improving  Syncope -had syncope am 08/31/21 AM -try to obtain EEG again  (uncooperative 1/30) -personally reviewed -hold lisinopril -CBC, CMP, lactate, CK -Echo -suspect vasovagal/orthostasis -bolus NS, restart IVF -1/31 ABG--7.411/36/99/23 on 2L -personally reviewed EKG--sinus, RBBB, unchanged from prior   Depression/PTSD -Restart Abilify 7.5 mg daily, Paxil 10 mg daily -continue ativan 0.5 mg am and lunch, 34m hs -ativan prn agitation -haldol prn agitation   Pseudomonas UTI -d/c ceftriaxone -pansensitive -started cefepime 1/30   BPH status post TURP -Continue finasteride and Rapaflo -d/c foley for voiding trial -unable to void since foley removal -re-insert foley   Hypothyroidism -Continue Synthroid   Hypertension -Continue lisinopril>>hold due to soft BPs am 1/31   Major Neurocognitive Disorder -has had propensity for agitation   Obesity -BMI 31.65 -lifestyle modification   Low Folate -replete -1/30 folate 5.5 -B12--393             Family Communication:   daughter updated 1/30   Consultants:  none   Code Status:  FULL    DVT Prophylaxis:  [place] Lovenox     Procedures: As Listed in Progress Note Above   Antibiotics: Ceftriaxone 1/28>>1/30 Cefepime 1/30>>          Subjective: Patient denies fevers, chills, headache, chest pain, dyspnea, nausea, vomiting, diarrhea, abdominal pain,    Objective: Vitals:   08/30/21 0544 08/30/21 1329 08/30/21 2220 08/31/21 0636  BP: 129/71 (!) 147/72 138/71 (!) 155/78  Pulse: 84 78 86 78  Resp: _0 Temp: 98.2 F (36.8 C) 97.6 F (36.4 C) 97.7 F (36.5 C) 97.8 F (36.6 C)  TempSrc: Oral Oral Oral Oral  SpO2: 93% 96% 92% 96%  Weight:      Height:        Intake/Output Summary (Last 24 hours) at 08/31/2021 0818 Last data filed at 08/31/2021 0539 Gross per 24 hour  Intake 1342.33 ml  Output 520 ml  Net 822.33 ml   Weight change:  Exam:  General:  Pt is alert, follows commands appropriately, not in acute distress HEENT: No icterus, No thrush, No neck mass,  [place]/AT Cardiovascular: RRR, S1/S2, no rubs, no gallops Respiratory: fine bibasilar crackles. No wheeze Abdomen: Soft/+BS, non tender, non distended, no guarding Extremities: No edema, No lymphangitis, No petechiae, No rashes, no synovitis   Data Reviewed: I have personally reviewed following labs and imaging studies Basic Metabolic Panel: Recent Labs  Lab 08/28/21 1049 08/28/21 1133 08/29/21 0431 08/30/21 0428 08/31/21 0512  NA 135 139 138 141 139  K 4.2 4.3 3.7 3.8 3.6  CL 104 102 107 109 107  CO2 29  --  _1 GLUCOSE 117* 112* 92 101* 108*  BUN _2 CREATININE 0.89 0.70 0.75 0.79 0.88  CALCIUM 8.6*  --  8.7* 9.0 9.2  MG  --   --   --  1.9 1.9   Liver Function Tests: Recent Labs  Lab 08/28/21 1049  AST 19  ALT 14  ALKPHOS 50  BILITOT 0.4  PROT 6.9  ALBUMIN 3.1*   No results for input(s): LIPASE, AMYLASE in the last 168 hours. No results for input(s): AMMONIA in the last 168 hours. Coagulation Profile: Recent Labs  Lab 08/28/21 1049  INR 1.2   CBC: Recent Labs  Lab 08/28/21 1049 08/28/21 1133 08/29/21 0431  08/30/21 0428  WBC 5.2  --  4.7 5.0  NEUTROABS 3.7  --   --   --   HGB 11.9* 12.6* 10.9* 11.6*  HCT 36.5* 37.0* 34.0* 36.7*  MCV 102.5*  --  103.7* 100.8*  PLT 140*  --  128* 138*   Cardiac Enzymes: No results for input(s): CKTOTAL, CKMB, CKMBINDEX, TROPONINI in the last 168 hours. BNP: Invalid input(s): POCBNP CBG: Recent Labs  Lab 08/31/21 0750  GLUCAP 129*   HbA1C: No results for input(s): HGBA1C in the last 72 hours. Urine analysis:    Component Value Date/Time   COLORURINE YELLOW 08/28/2021 [place] 08/28/2021 1219   LABSPEC 1.010 08/28/2021 1219   PHURINE 7.0 08/28/2021 [place] 08/28/2021 1219   HGBUR NEGATIVE 08/28/2021 1219   [place] 08/28/2021 1219   KETONESUR NEGATIVE 08/28/2021 1219   PROTEINUR NEGATIVE 08/28/2021 1219   NITRITE POSITIVE (A) 08/28/2021 1219    LEUKOCYTESUR TRACE (A) 08/28/2021 1219   Sepsis Labs: _0 (procalcitonin:4,lacticidven:4) ) Recent Results (from the past 240 hour(s))  Resp Panel by RT-PCR (Flu A&B, Covid) Nasopharyngeal Swab     Status: None   Collection Time: 08/28/21 11:17 AM   Specimen: Nasopharyngeal Swab; Nasopharyngeal(NP) swabs in vial transport medium  Result Value Ref Range Status   SARS Coronavirus 2 by RT PCR NEGATIVE NEGATIVE Final    Comment: (NOTE) SARS-CoV-2 target nucleic acids are NOT DETECTED.  The SARS-CoV-2 RNA is generally detectable in upper respiratory specimens during the acute phase of infection. The lowest concentration of SARS-CoV-2 viral copies this assay can detect is 138 copies/mL. A negative result does not preclude SARS-Cov-2 infection and should not be used as the sole basis for treatment or other patient management decisions. A negative result may occur with  improper specimen collection/handling, submission of specimen other than nasopharyngeal swab, presence of viral mutation(s) within the areas targeted by this assay, and inadequate number of viral copies(<138 copies/mL). A negative result must be combined with clinical observations, patient history, and epidemiological information. The expected result is Negative.  Fact Sheet for Patients:  EntrepreneurPulse.com.au  Fact Sheet for Healthcare Providers:  IncredibleEmployment.be  This test is no t yet approved or cleared by the [place] FDA and  has been authorized for detection and/or diagnosis of SARS-CoV-2 by FDA under an Emergency Use Authorization (EUA). This EUA will remain  in effect (meaning this test can be used) for the duration of the COVID-19 declaration under Section 564(b)(1) of the Act, 21 U.S.C.section 360bbb-3(b)(1), unless the authorization is terminated  or revoked sooner.       Influenza A by PCR NEGATIVE NEGATIVE Final   Influenza B by PCR NEGATIVE  NEGATIVE Final    Comment: (NOTE) The Xpert Xpress SARS-CoV-2/FLU/RSV plus assay is intended as an aid in the diagnosis of influenza from Nasopharyngeal swab specimens and should not be used as a sole basis for treatment. Nasal washings and aspirates are unacceptable for Xpert Xpress SARS-CoV-2/FLU/RSV testing.  Fact Sheet for Patients: EntrepreneurPulse.com.au  Fact Sheet for Healthcare Providers: IncredibleEmployment.be  This test is not yet approved or cleared by the [place] FDA and has been authorized for detection and/or diagnosis of SARS-CoV-2 by FDA under an Emergency Use Authorization (EUA). This EUA will remain in effect (meaning this test can be used) for the duration of the COVID-19 declaration under Section 564(b)(1) of the Act, 21 U.S.C. section 360bbb-3(b)(1), unless the authorization is terminated or revoked.  Performed at [facility],  [address], [place], [place] [address]   Urine Culture     Status: Abnormal   Collection Time: 08/28/21 12:32 PM   Specimen: Urine, Catheterized  Result Value Ref Range Status   Specimen Description   Final    URINE, CATHETERIZED Performed at [facility], [address] [place], [place] [address]    Special Requests   Final    NONE Performed at [facility], [address] [place], [place] [address]    Culture >=100,000 COLONIES/mL PSEUDOMONAS AERUGINOSA (A)  Final   Report Status 08/31/2021 FINAL  Final   Organism ID, Bacteria PSEUDOMONAS AERUGINOSA (A)  Final      Susceptibility   Pseudomonas aeruginosa - MIC*    CEFTAZIDIME 4 SENSITIVE Sensitive     CIPROFLOXACIN 0.5 SENSITIVE Sensitive     GENTAMICIN <=1 SENSITIVE Sensitive     IMIPENEM 2 SENSITIVE Sensitive     PIP/TAZO 8 SENSITIVE Sensitive     CEFEPIME 2 SENSITIVE Sensitive     * >=100,000 COLONIES/mL PSEUDOMONAS AERUGINOSA     Scheduled Meds:  ARIPiprazole  7.5 mg Oral Daily   aspirin  325 mg Oral Daily    Chlorhexidine Gluconate Cloth  6 each Topical Daily   docusate sodium  100 mg Oral Daily   enoxaparin (LOVENOX) injection  40 mg Subcutaneous Q24H   feeding supplement  237 mL Oral TID BM   finasteride  5 mg Oral Daily   folic acid  1 mg Oral Daily   levothyroxine  137 mcg Oral QAC breakfast   lisinopril  20 mg Oral Daily   LORazepam  0.5 mg Oral BID WC   LORazepam  1 mg Oral QHS   nystatin   Topical BID   PARoxetine  10 mg Oral Daily   polyethylene glycol  17 g Oral Daily   tamsulosin  0.4 mg Oral QPC breakfast   Vitamin D (Ergocalciferol)  50,000 Units Oral Q7 days   Continuous Infusions:  sodium chloride Stopped (08/28/21 1930)   ceFEPime (MAXIPIME) IV 2 g (08/31/21 0549)    Procedures/Studies: DG Chest 1 View  Result Date: 08/04/2021 CLINICAL DATA:  Weakness EXAM: CHEST  1 VIEW COMPARISON:  Chest x-ray 07/31/2021 FINDINGS: Heart size within normal limits. Mediastinum appears stable. Calcified plaques in the aortic arch. Subsegmental atelectatic changes in the lung bases. No focal consolidation identified. No significant pleural effusion identified. No pneumothorax. IMPRESSION: Bibasilar subsegmental atelectasis. Electronically Signed   By: Ofilia Neas M.D.   On: 08/04/2021 15:22   CT HEAD WO CONTRAST  Result Date: 08/28/2021 CLINICAL DATA:  Weakness in right arm EXAM: CT HEAD WITHOUT CONTRAST TECHNIQUE: Contiguous axial images were obtained from the base of the skull through the vertex without intravenous contrast. RADIATION DOSE REDUCTION: This exam was performed according to the departmental dose-optimization program which includes automated exposure control, adjustment of the mA and/or kV according to patient size and/or use of iterative reconstruction technique. COMPARISON:  CT head 08/04/2021 FINDINGS: Brain: There is no evidence of acute intracranial hemorrhage, extra-axial fluid collection, or acute infarct. Parenchymal volume is within normal limits. The ventricles are  stable in size. There is confluent hypodensity in the subcortical and periventricular white matter likely reflecting sequela of moderate chronic white matter microangiopathy, unchanged. There is no mass lesion.  There is no midline shift. Vascular: There is calcification of the bilateral cavernous ICAs. Skull: Normal. Negative for fracture or focal lesion. Sinuses/Orbits: The imaged paranasal sinuses are clear. Bilateral lens implants are in place. The  globes and orbits are otherwise unremarkable. Other: None. IMPRESSION: No acute intracranial pathology. No significant interval change since 08/04/2021. Electronically Signed   By: Valetta Mole M.D.   On: 08/28/2021 11:24   CT HEAD WO CONTRAST (5MM)  Result Date: 08/04/2021 CLINICAL DATA:  Altered mental status, nontraumatic. EXAM: CT HEAD WITHOUT CONTRAST TECHNIQUE: Contiguous axial images were obtained from the base of the skull through the vertex without intravenous contrast. COMPARISON:  None. FINDINGS: Brain: There is mild cortical atrophy, within normal limits for patient age. The ventricles are normal in configuration. The basilar cisterns are patent. No mass, mass effect, or midline shift. No acute intracranial hemorrhage is seen. No abnormal extra-axial fluid collection. Moderate periventricular and subcortical white matter hypodensities, nonspecific but most likely secondary to chronic ischemic white matter changes, within normal limits for patient age. 7 Preservation of the normal cortical gray-white interface without CT evidence of an acute major vascular territorial cortical based infarction. Vascular: No hyperdense vessel or unexpected calcification. Skull: Normal. Negative for fracture or focal lesion. Sinuses/Orbits: Status post bilateral intra-ocular lens replacements. Small anterior right maxillary sinus mucosal polyp. The mastoid air cells are clear. Other: None. IMPRESSION: Mild cortical atrophy and moderate chronic ischemic white matter  changes, within normal limits for patient age. No acute intracranial process. Electronically Signed   By: Yvonne Kendall   On: 08/04/2021 15:28   MR BRAIN WO CONTRAST  Result Date: 08/30/2021 CLINICAL DATA:  Mental status change, unknown cause. Weakness and confusion. EXAM: MRI HEAD WITHOUT CONTRAST TECHNIQUE: Multiplanar, multiecho pulse sequences of the brain and surrounding structures were obtained without intravenous contrast. COMPARISON:  Head CT 08/28/2021 FINDINGS: Brain: Diffusion imaging does not show any acute or subacute infarction. Chronic small-vessel ischemic changes affect pons. 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 Advanced chronic small-vessel ischemic changes are present throughout the cerebral hemispheric white matter. No cortical or large vessel territory infarction. No mass lesion, hemorrhage, hydrocephalus or extra-axial collection. Vascular: Major vessels at the base of the brain show flow. Skull and upper cervical spine: Negative Sinuses/Orbits: Clear/normal Other: None IMPRESSION: No acute MR finding. Chronic small-vessel ischemic changes affecting the pons and cerebral hemispheric white matter. Electronically Signed   By: Nelson Chimes M.D.   On: 08/30/2021 12:56   CT Renal Stone Study  Result Date: 08/04/2021 CLINICAL DATA:  Foreign body, GU tract oliguria. decreased urinary output and increased generalized weakness since being discharged recently for kidney stone Standard full stone protocol used EXAM: CT ABDOMEN AND PELVIS WITHOUT CONTRAST TECHNIQUE: Multidetector CT imaging of the abdomen and pelvis was performed following the standard protocol without IV contrast. COMPARISON:  None. FINDINGS: Lower chest: No acute abnormality. Three-vessel coronary artery calcification. Hepatobiliary: No focal liver abnormality. No gallstones, gallbladder wall thickening, or pericholecystic fluid. No biliary dilatation. Pancreas: No focal lesion. Normal pancreatic contour. No  surrounding inflammatory changes. No main pancreatic ductal dilatation. Spleen: Normal in size without focal abnormality. Adrenals/Urinary Tract: No adrenal nodule bilaterally. There is a 3 mm calcified stone within the left kidney. Likely punctate calcification within the right kidney. The other calcifications are likely vascular. No hydronephrosis. Bilateral renal cortical scarring. No definite contour-deforming renal mass. No ureterolithiasis or hydroureter. The urinary bladder is decompressed with a urinary bladder tip and balloon terminating within the lumen. Query slight perivesicular fat stranding and hazy urinary bladder wall contour. Stomach/Bowel: Stomach is within normal limits. No evidence of bowel wall thickening or dilatation. Appendix appears normal. Vascular/Lymphatic: No abdominal aorta or iliac aneurysm. Severe  atherosclerotic plaque of the aorta and its branches. No abdominal, pelvic, or inguinal lymphadenopathy. Reproductive: Prostatectomy. Other: No intraperitoneal free fluid. No intraperitoneal free gas. No organized fluid collection. Musculoskeletal: Bilateral fat containing small inguinal hernias. No suspicious lytic or blastic osseous lesions. No acute displaced fracture. Multilevel severe degenerative changes of the spine. Diffusely decreased bone density. Grade 1 anterolisthesis of L4 on L5. L3-L5 posterolateral and interbody fusion. No pedicular screw at the L4 level on the left. IMPRESSION: 1. Query slight perivesicular fat stranding and hazy urinary bladder wall contour. Markedly limited evaluation due to collapse of the urinary bladder with Foley catheter in place. Recommend correlation with urinalysis for infection. 2. Nonobstructive bilateral nephrolithiasis, 3 mm on the left and punctate on the right. 3.  Bilateral fat containing small inguinal hernias. 4.  Aortic Atherosclerosis (ICD10-I70.0). Electronically Signed   By: Iven Finn M.D.   On: 08/04/2021 15:29    Orson Eva,  DO  [facility]  If 7PM-7AM, please contact night-coverage [email or web address] Password [email or web address] 08/31/2021, 8:18 AM   LOS: 3 days

## 2021-09-01 ENCOUNTER — Inpatient Hospital Stay (HOSPITAL_COMMUNITY): Payer: No Typology Code available for payment source

## 2021-09-01 ENCOUNTER — Encounter (HOSPITAL_COMMUNITY): Payer: Self-pay | Admitting: Internal Medicine

## 2021-09-01 ENCOUNTER — Telehealth: Payer: Self-pay

## 2021-09-01 DIAGNOSIS — I951 Orthostatic hypotension: Secondary | ICD-10-CM | POA: Diagnosis not present

## 2021-09-01 LAB — BASIC METABOLIC PANEL
Anion gap: 6 (ref 5–15)
BUN: 12 mg/dL (ref 8–23)
CO2: 25 mmol/L (ref 22–32)
Calcium: 8.6 mg/dL — ABNORMAL LOW (ref 8.9–10.3)
Chloride: 109 mmol/L (ref 98–111)
Creatinine, Ser: 0.78 mg/dL (ref 0.61–1.24)
GFR, Estimated: >60 mL/min (ref >60)
Glucose, Bld: 96 mg/dL (ref 70–99)
Potassium: 3.8 mmol/L (ref 3.5–5.1)
Sodium: 140 mmol/L (ref 135–145)

## 2021-09-01 LAB — BLOOD GAS, ARTERIAL
Acid-Base Excess: 1.5 mmol/L (ref 0.0–2.0)
Bicarbonate: 25.6 mmol/L (ref 20.0–28.0)
Drawn by: 23430
FIO2: 21
O2 Saturation: 94.9 %
Patient temperature: 36.4
pCO2 arterial: 39.4 mmHg (ref 32.0–48.0)
pH, Arterial: 7.424 (ref 7.350–7.450)
pO2, Arterial: 68.4 mmHg — ABNORMAL LOW (ref 83.0–108.0)

## 2021-09-01 LAB — CBC
HCT: 33.6 % — ABNORMAL LOW (ref 39.0–52.0)
Hemoglobin: 10.6 g/dL — ABNORMAL LOW (ref 13.0–17.0)
MCH: 31.8 pg (ref 26.0–34.0)
MCHC: 31.5 g/dL (ref 30.0–36.0)
MCV: 100.9 fL — ABNORMAL HIGH (ref 80.0–100.0)
Platelets: 128 10*3/uL — ABNORMAL LOW (ref 150–400)
RBC: 3.33 MIL/uL — ABNORMAL LOW (ref 4.22–5.81)
RDW: 12.4 % (ref 11.5–15.5)
WBC: 3.8 10*3/uL — ABNORMAL LOW (ref 4.0–10.5)
nRBC: 0 % (ref 0.0–0.2)

## 2021-09-01 LAB — MAGNESIUM: Magnesium: 1.9 mg/dL (ref 1.7–2.4)

## 2021-09-01 MED ORDER — MELATONIN 3 MG PO TABS
6.0000 mg | ORAL_TABLET | Freq: Every day | ORAL | Status: DC
  Administered 2021-09-01 – 2021-09-05 (×5): 6 mg via ORAL
  Filled 2021-09-01 (×5): qty 2

## 2021-09-01 NOTE — Telephone Encounter (Signed)
Patient's daughter is calling to find out why her father keeps having UTI's and getting sick with them.  Please advise.  Call back:  [phone] - Elmon Kirschner  Thanks, Helene Kelp

## 2021-09-01 NOTE — Plan of Care (Signed)
°  Problem: Acute Rehab PT Goals(only PT should resolve) Goal: Pt Will Go Supine/Side To Sit Outcome: Progressing Flowsheets (Taken 09/01/2021 1224) Pt will go Supine/Side to Sit:  with min guard assist  with minimal assist Goal: Patient Will Transfer Sit To/From Stand Outcome: Progressing Flowsheets (Taken 09/01/2021 1224) Patient will transfer sit to/from stand:  with min guard assist  with minimal assist Goal: Pt Will Transfer Bed To Chair/Chair To Bed Outcome: Progressing Flowsheets (Taken 09/01/2021 1224) Pt will Transfer Bed to Chair/Chair to Bed: with min assist Goal: Pt Will Ambulate Outcome: Progressing Flowsheets (Taken 09/01/2021 1224) Pt will Ambulate:  25 feet  with minimal assist  with moderate assist  with rolling walker   12:24 PM, 09/01/21 Ocie Bob, MPT Physical Therapist with [facility] [phone] office [phone] mobile phone

## 2021-09-01 NOTE — Progress Notes (Signed)
BP soft while up in the chair, 80s/40s. RASS -3/-4 difficult to arouse even with deep sternal rub, intermittently apneic.  Placed back in bed with RN/NA assistance. BP improved in bed however pt continues to go apneic and obstruct his airway even while awake. SPO2 remains >97 on RA. Attempted to reposition head to open airway however the pt's neck is rigid at this time. MD paged and came to bedside to evaluate.

## 2021-09-01 NOTE — Progress Notes (Signed)
PROGRESS NOTE  Patrick Kerr GNF:[id number] DOB: 1946/04/27 DOA: 08/28/2021 PCP: Ludwig Clarks, FNP  Brief History:  76 y.o. male with medical history of dementia, BOO, BPH s/p TURP, chronic indwelling Foley catheter, hypothyroidism, hypertension, PTSD presenting with altered mental status.  The patient was recently hospitalized from 07/17/2021 to 07/29/2021 for septic shock due to Klebsiella UTI.  During that hospitalization the patient required intubation.  There was also a question of serotonin syndrome resulting in discontinuation of his Abilify and Zoloft.  The patient was discharged home with home health physical therapy.  The patient returned to the emergency department once again on 08/04/2021 with generalized weakness and irritability.  He was seen by the hospitalist at that time who felt that there was no new acute issue and this most likely is continued natural recovery from his recent prolonged hospitalization.  Once again, the patient was hospitalized at [facility] from 08/18/2021 to 08/21/2021 for agitation, confusion, and aggressive behavior.  It was felt that this was due to the patient's electrolyte derangement, UTI, and being off his psychiatric medications.  He was seen by psychiatry at that time who restarted the patient's Abilify at 7.5 mg daily.  Ativan was given 0.5 mg twice daily during the day and 1 mg at bedtime. The patient's daughter states that since returning home from [place] [facility], the patient has been doing fairly well up until the morning of 08/28/2021.  After breakfast, she noted that the patient was somewhat confused.  Caretaker stated that patient was speaking nonsensically and kept repeating the same words.  In addition, the patient was doing nonsensical things with his right arm.  Care taker stated that he was not particularly having any focal weakness, but which is grabbing at things that were not there and also grabbing at objects for no  reason.  There was no loss of consciousness.  She feel patient may have been incited by something his spouse said.  EMS was activated, and she stated that although he was a little bit more lucid he still remained somewhat confused in the emergency department.  In fact, at the time of my evaluation, the patient still have difficulty recognizing the patient's daughter, but he was able to speak clearly.  She states that he last had his Foley catheter changed on 08/23/2021.  There is been no reports of headache, chest pain, short of breath, cough, hemoptysis, nausea, vomiting without abdominal pain.  ED Patient was afebrile hemodynamically stable with oxygen saturation 97% room air.  BMP showed a sodium 135, potassium 4.2, chloride 104, bicarbonate 29, BUN 12, creatinine 0.9.  AST 19, ALT 14, alk phosphatase 50, total bilirubin 0.4.  WBC 5.2, hemoglobin 1.9, platelets 140,000.  CT of the brain was negative for any acute findings.  UA showed 21-50 WBC.  UDS was negative.  RAPID RESPONSE CALLED 08/31/21 AM -patient walked to bathroom and sat on commode.  NT went to check up on patient and was trying to get pt up when he became minimally responsive.  ("Eyes rolled back").  I arrived as staff was getting patient back to bed.  Patient awakens to tactile stimuli but not following commands or answering questions CBG 129, initial BP 60/41, 99% on 3L. About 10 min after syncope, patient following commands and answers questions.  Repeat vitals 5-10 min after event--HR 80, RR18--137/77, 99% on 3L  2/1: Pt had another near syncopal episode when getting up in chair.  His vitals quickly returned to baseline when lying back down in bed.    Assessment/Plan: Acute metabolic encephalopathy -Secondary to UTI -08/29/21 serum B12--393 -07/17/2021 TSH--0.053 -UA 21-50WBC -Follow urine culture>>pseudomonas -1/29--pleasantly confused, having sundowning episode -EEG--unable to obtain due to patient's uncooperativeness -1/30  MRI brain--neg for stroke, no acute findings -overall improving  Syncope -had syncope am 08/31/21 AM -try to obtain EEG again (uncooperative 1/30) -personally reviewed -hold lisinopril -CBC, CMP, lactate, CK -Echo -suspect vasovagal/orthostasis -bolus NS, restart IVF -1/31 ABG--7.411/36/99/23 on 2L -personally reviewed EKG--sinus, RBBB, unchanged from prior   Depression/PTSD -Restart Abilify 7.5 mg daily, Paxil 10 mg daily -continue ativan 0.5 mg am and lunch, 80m hs -ativan prn agitation -haldol prn agitation   Pseudomonas UTI -d/c ceftriaxone -pansensitive -started cefepime 1/30   BPH status post TURP -Continue finasteride and Rapaflo -d/c foley for voiding trial -unable to void since foley removal -re-insert foley   Hypothyroidism -Continue Synthroid   Hypertension -Continue lisinopril>>hold due to soft BPs am 1/31   Major Neurocognitive Disorder -has had propensity for agitation   Obesity -BMI 31.65 -lifestyle modification   Low Folate -replete -1/30 folate 5.5 -B12--393    Family Communication:   daughter updated 1/30, 2/1   Consultants:  none   Code Status:  FULL    DVT Prophylaxis:  [place] Lovenox    Procedures: As Listed in Progress Note Above   Antibiotics: Ceftriaxone 1/28>>1/30 Cefepime 1/30>>   Subjective: Pt had another episode of orthostatic syncope this morning when getting up to bed.  After resting and lying down his vitals improved back to baseline.    Objective: Vitals:   09/01/21 1300 09/01/21 1310 09/01/21 1400 09/01/21 1500  BP:  (!) 154/74 (!) 168/59 (!) 161/80  Pulse:   82 88  Resp: (!) 23 15 19 19   Temp:      TempSrc:      SpO2:   100% 100%  Weight:      Height:        Intake/Output Summary (Last 24 hours) at 09/01/2021 1538 Last data filed at 09/01/2021 0900 Gross per 24 hour  Intake 796.91 ml  Output 1900 ml  Net -1103.09 ml   Weight change:  Exam:  General:  Pt is alert, follows commands appropriately, not in  acute distress HEENT: No icterus, No thrush, No neck mass, [place]/AT Cardiovascular: RRR, S1/S2, no rubs, no gallops Respiratory: fine bibasilar crackles. No wheeze Abdomen: Soft/+BS, non tender, non distended, no guarding Extremities: No edema, No lymphangitis, No petechiae, No rashes, no synovitis Neurological: nonfocal findings.    Data Reviewed: I have personally reviewed following labs and imaging studies Basic Metabolic Panel: Recent Labs  Lab 08/29/21 0431 08/30/21 0428 08/31/21 0512 08/31/21 0822 09/01/21 0704  NA 138 141 139 138 140  K 3.7 3.8 3.6 3.4* 3.8  CL 107 109 107 108 109  CO2 25 27 24 23 25   GLUCOSE 92 101* 108* 144* 96  BUN 11 10 14 14 12   CREATININE 0.75 0.79 0.88 0.93 0.78  CALCIUM 8.7* 9.0 9.2 9.0 8.6*  MG  --  1.9 1.9  --  1.9   Liver Function Tests: Recent Labs  Lab 08/28/21 1049 08/31/21 0822  AST 19 20  ALT 14 14  ALKPHOS 50 50  BILITOT 0.4 0.8  PROT 6.9 7.0  ALBUMIN 3.1* 3.3*   No results for input(s): LIPASE, AMYLASE in the last 168 hours. No results for input(s): AMMONIA in the last 168 hours. Coagulation Profile: Recent Labs  Lab 08/28/21 1049  INR 1.2   CBC: Recent Labs  Lab 08/28/21 1049 08/28/21 1133 08/29/21 0431 08/30/21 0428 08/31/21 0822 09/01/21 0704  WBC 5.2  --  4.7 5.0 5.0 3.8*  NEUTROABS 3.7  --   --   --   --   --   HGB 11.9* 12.6* 10.9* 11.6* 11.4* 10.6*  HCT 36.5* 37.0* 34.0* 36.7* 35.6* 33.6*  MCV 102.5*  --  103.7* 100.8* 99.4 100.9*  PLT 140*  --  128* 138* 156 128*   Cardiac Enzymes: Recent Labs  Lab 08/31/21 0822  CKTOTAL 46*   BNP: Invalid input(s): POCBNP CBG: Recent Labs  Lab 08/31/21 0750  GLUCAP 129*   HbA1C: No results for input(s): HGBA1C in the last 72 hours. Urine analysis:    Component Value Date/Time   COLORURINE YELLOW 08/28/2021 [place] 08/28/2021 1219   LABSPEC 1.010 08/28/2021 1219   PHURINE 7.0 08/28/2021 1219   GLUCOSEU NEGATIVE 08/28/2021 1219    HGBUR NEGATIVE 08/28/2021 1219   [place] 08/28/2021 1219   [place] 08/28/2021 1219   PROTEINUR NEGATIVE 08/28/2021 1219   NITRITE POSITIVE (A) 08/28/2021 1219   LEUKOCYTESUR TRACE (A) 08/28/2021 1219   Recent Results (from the past 240 hour(s))  Resp Panel by RT-PCR (Flu A&B, Covid) Nasopharyngeal Swab     Status: None   Collection Time: 08/28/21 11:17 AM   Specimen: Nasopharyngeal Swab; Nasopharyngeal(NP) swabs in vial transport medium  Result Value Ref Range Status   SARS Coronavirus 2 by RT PCR NEGATIVE NEGATIVE Final    Comment: (NOTE) SARS-CoV-2 target nucleic acids are NOT DETECTED.  The SARS-CoV-2 RNA is generally detectable in upper respiratory specimens during the acute phase of infection. The lowest concentration of SARS-CoV-2 viral copies this assay can detect is 138 copies/mL. A negative result does not preclude SARS-Cov-2 infection and should not be used as the sole basis for treatment or other patient management decisions. A negative result may occur with  improper specimen collection/handling, submission of specimen other than nasopharyngeal swab, presence of viral mutation(s) within the areas targeted by this assay, and inadequate number of viral copies(<138 copies/mL). A negative result must be combined with clinical observations, patient history, and epidemiological information. The expected result is Negative.  Fact Sheet for Patients:  EntrepreneurPulse.com.au  Fact Sheet for Healthcare Providers:  IncredibleEmployment.be  This test is no t yet approved or cleared by the [place] FDA and  has been authorized for detection and/or diagnosis of SARS-CoV-2 by FDA under an Emergency Use Authorization (EUA). This EUA will remain  in effect (meaning this test can be used) for the duration of the COVID-19 declaration under Section 564(b)(1) of the Act, 21 U.S.C.section 360bbb-3(b)(1), unless the  authorization is terminated  or revoked sooner.       Influenza A by PCR NEGATIVE NEGATIVE Final   Influenza B by PCR NEGATIVE NEGATIVE Final    Comment: (NOTE) The Xpert Xpress SARS-CoV-2/FLU/RSV plus assay is intended as an aid in the diagnosis of influenza from Nasopharyngeal swab specimens and should not be used as a sole basis for treatment. Nasal washings and aspirates are unacceptable for Xpert Xpress SARS-CoV-2/FLU/RSV testing.  Fact Sheet for Patients: EntrepreneurPulse.com.au  Fact Sheet for Healthcare Providers: IncredibleEmployment.be  This test is not yet approved or cleared by the [place] FDA and has been authorized for detection and/or diagnosis of SARS-CoV-2 by FDA under an Emergency Use Authorization (EUA). This EUA will remain in effect (meaning this test  can be used) for the duration of the COVID-19 declaration under Section 564(b)(1) of the Act, 21 U.S.C. section 360bbb-3(b)(1), unless the authorization is terminated or revoked.  Performed at [facility], [address], [place], [place] [address]   Urine Culture     Status: Abnormal   Collection Time: 08/28/21 12:32 PM   Specimen: Urine, Catheterized  Result Value Ref Range Status   Specimen Description   Final    URINE, CATHETERIZED Performed at [facility], [address] [place], [place] [address]    Special Requests   Final    NONE Performed at [facility], [address] [place], [place] [address]    Culture >=100,000 COLONIES/mL PSEUDOMONAS AERUGINOSA (A)  Final   Report Status 08/31/2021 FINAL  Final   Organism ID, Bacteria PSEUDOMONAS AERUGINOSA (A)  Final      Susceptibility   Pseudomonas aeruginosa - MIC*    CEFTAZIDIME 4 SENSITIVE Sensitive     CIPROFLOXACIN 0.5 SENSITIVE Sensitive     GENTAMICIN <=1 SENSITIVE Sensitive     IMIPENEM 2 SENSITIVE Sensitive     PIP/TAZO 8 SENSITIVE Sensitive     CEFEPIME 2 SENSITIVE Sensitive     * >=100,000  COLONIES/mL PSEUDOMONAS AERUGINOSA  Culture, blood (routine x 2)     Status: None (Preliminary result)   Collection Time: 08/31/21 10:41 AM   Specimen: BLOOD  Result Value Ref Range Status   Specimen Description BLOOD LEFT ANTECUBITAL  Final   Special Requests   Final    BOTTLES DRAWN AEROBIC AND ANAEROBIC Blood Culture adequate volume   Culture   Final    NO GROWTH < 24 HOURS Performed at [facility], [address] [place],  [address]    Report Status PENDING  Incomplete  Culture, blood (routine x 2)     Status: None (Preliminary result)   Collection Time: 08/31/21 10:41 AM   Specimen: BLOOD  Result Value Ref Range Status   Specimen Description BLOOD LEFT ANTECUBITAL  Final   Special Requests   Final    BOTTLES DRAWN AEROBIC AND ANAEROBIC Blood Culture results may not be optimal due to an excessive volume of blood received in culture bottles   Culture   Final    NO GROWTH < 24 HOURS Performed at [facility], [address] [place],  [address]    Report Status PENDING  Incomplete     Scheduled Meds:  ARIPiprazole  7.5 mg Oral Daily   aspirin  325 mg Oral Daily   Chlorhexidine Gluconate Cloth  6 each Topical Daily   docusate sodium  100 mg Oral Daily   enoxaparin (LOVENOX) injection  40 mg Subcutaneous Q24H   feeding supplement  237 mL Oral TID BM   finasteride  5 mg Oral Daily   folic acid  1 mg Oral Daily   levothyroxine  137 mcg Oral QAC breakfast   LORazepam  0.5 mg Oral BID WC   LORazepam  1 mg Oral QHS   nystatin   Topical BID   PARoxetine  10 mg Oral Daily   polyethylene glycol  17 g Oral Daily   tamsulosin  0.4 mg Oral QPC breakfast   triamcinolone cream   Topical BID   Vitamin D (Ergocalciferol)  50,000 Units Oral Q7 days   Continuous Infusions:  sodium chloride 100 mL/hr at 08/31/21 1128   ceFEPime (MAXIPIME) IV 2 g (09/01/21 1517)   Procedures/Studies: DG Chest 1 View  Result Date: 08/04/2021 CLINICAL DATA:  Weakness  EXAM: CHEST  1 VIEW  COMPARISON:  Chest x-ray 07/31/2021 FINDINGS: Heart size within normal limits. Mediastinum appears stable. Calcified plaques in the aortic arch. Subsegmental atelectatic changes in the lung bases. No focal consolidation identified. No significant pleural effusion identified. No pneumothorax. IMPRESSION: Bibasilar subsegmental atelectasis. Electronically Signed   By: Ofilia Neas M.D.   On: 08/04/2021 15:22   CT HEAD WO CONTRAST (5MM)  Result Date: 09/01/2021 CLINICAL DATA:  Neuro deficit, acute, stroke suspected motion EXAM: CT HEAD WITHOUT CONTRAST TECHNIQUE: Contiguous axial images were obtained from the base of the skull through the vertex without intravenous contrast. RADIATION DOSE REDUCTION: This exam was performed according to the departmental dose-optimization program which includes automated exposure control, adjustment of the mA and/or kV according to patient size and/or use of iterative reconstruction technique. COMPARISON:  CT head 08/30/2021. FINDINGS: Brain: No evidence of acute infarction, hemorrhage, hydrocephalus, extra-axial collection or mass lesion/mass effect. Patchy and confluent white matter hypoattenuation, nonspecific compatible chronic microvascular disease. Vascular: No hyperdense vessel identified. Calcific intracranial atherosclerosis. Skull: No evidence of acute fracture. Sinuses/Orbits: Mild paranasal sinus mucosal thickening. Unremarkable orbits. IMPRESSION: 1. No evidence of acute intracranial abnormality on this motion limited study. 2. Chronic microvascular disease. Electronically Signed   By: Margaretha Sheffield M.D.   On: 09/01/2021 11:31   CT HEAD WO CONTRAST (5MM)  Result Date: 08/31/2021 CLINICAL DATA:  Dementia, urinary tract infection, altered level of consciousness, aggressive behavior EXAM: CT HEAD WITHOUT CONTRAST TECHNIQUE: Contiguous axial images were obtained from the base of the skull through the vertex without intravenous contrast. RADIATION DOSE REDUCTION:  This exam was performed according to the departmental dose-optimization program which includes automated exposure control, adjustment of the mA and/or kV according to patient size and/or use of iterative reconstruction technique. COMPARISON:  08/28/2021, 08/30/2021 FINDINGS: Brain: Stable hypodensities throughout the periventricular white matter and central pons consistent with chronic small vessel ischemic change. No evidence of acute infarct or hemorrhage. The lateral ventricles and remaining midline structures are unremarkable. No acute extra-axial fluid collections. No mass effect. Vascular: No hyperdense vessel or unexpected calcification. Skull: Normal. Negative for fracture or focal lesion. Sinuses/Orbits: Polypoid mucosal thickening within the right maxillary, left frontal, left ethmoid sinuses. Stable chronic right mastoid effusion. Other: None. IMPRESSION: 1. No acute intracranial process. 2. Stable chronic small-vessel ischemic changes. Electronically Signed   By: Randa Ngo M.D.   On: 08/31/2021 15:36   CT HEAD WO CONTRAST  Result Date: 08/28/2021 CLINICAL DATA:  Weakness in right arm EXAM: CT HEAD WITHOUT CONTRAST TECHNIQUE: Contiguous axial images were obtained from the base of the skull through the vertex without intravenous contrast. RADIATION DOSE REDUCTION: This exam was performed according to the departmental dose-optimization program which includes automated exposure control, adjustment of the mA and/or kV according to patient size and/or use of iterative reconstruction technique. COMPARISON:  CT head 08/04/2021 FINDINGS: Brain: There is no evidence of acute intracranial hemorrhage, extra-axial fluid collection, or acute infarct. Parenchymal volume is within normal limits. The ventricles are stable in size. There is confluent hypodensity in the subcortical and periventricular white matter likely reflecting sequela of moderate chronic white matter microangiopathy, unchanged. There is no  mass lesion.  There is no midline shift. Vascular: There is calcification of the bilateral cavernous ICAs. Skull: Normal. Negative for fracture or focal lesion. Sinuses/Orbits: The imaged paranasal sinuses are clear. Bilateral lens implants are in place. The globes and orbits are otherwise unremarkable. Other: None. IMPRESSION: No acute intracranial pathology. No significant interval  change since 08/04/2021. Electronically Signed   By: Valetta Mole M.D.   On: 08/28/2021 11:24   CT HEAD WO CONTRAST (5MM)  Result Date: 08/04/2021 CLINICAL DATA:  Altered mental status, nontraumatic. EXAM: CT HEAD WITHOUT CONTRAST TECHNIQUE: Contiguous axial images were obtained from the base of the skull through the vertex without intravenous contrast. COMPARISON:  None. FINDINGS: Brain: There is mild cortical atrophy, within normal limits for patient age. The ventricles are normal in configuration. The basilar cisterns are patent. No mass, mass effect, or midline shift. No acute intracranial hemorrhage is seen. No abnormal extra-axial fluid collection. Moderate periventricular and subcortical white matter hypodensities, nonspecific but most likely secondary to chronic ischemic white matter changes, within normal limits for patient age. 7 Preservation of the normal cortical gray-white interface without CT evidence of an acute major vascular territorial cortical based infarction. Vascular: No hyperdense vessel or unexpected calcification. Skull: Normal. Negative for fracture or focal lesion. Sinuses/Orbits: Status post bilateral intra-ocular lens replacements. Small anterior right maxillary sinus mucosal polyp. The mastoid air cells are clear. Other: None. IMPRESSION: Mild cortical atrophy and moderate chronic ischemic white matter changes, within normal limits for patient age. No acute intracranial process. Electronically Signed   By: Yvonne Kendall   On: 08/04/2021 15:28   CT SOFT TISSUE NECK WO CONTRAST  Result Date:  09/01/2021 CLINICAL DATA:  Epiglottitis or tonsillitis suspected Tech note:  New AMS and difficulty swallowing, patient is combative EXAM: CT NECK WITHOUT CONTRAST TECHNIQUE: Multidetector CT imaging of the neck was performed following the standard protocol without intravenous contrast. RADIATION DOSE REDUCTION: This exam was performed according to the departmental dose-optimization program which includes automated exposure control, adjustment of the mA and/or kV according to patient size and/or use of iterative reconstruction technique. COMPARISON:  None. FINDINGS: Motion study.  Within this limitation: Pharynx and larynx: Limited assessment due to motion and streak artifact from dental amalgam. Within this limitation, no visible mass or clear edema. Salivary glands: No inflammation, mass, or stone. Thyroid: Small left thyroid. Lymph nodes: None enlarged or abnormal density. Vascular: Calcific atherosclerosis. Limited evaluation due to noncontrast technique. Limited intracranial: Negative. Visualized orbits: Negative. Mastoids and visualized paranasal sinuses: Right maxillary sinus retention cyst. Small right mastoid effusion. Skeleton: Severe multilevel degenerative change. Prominent anterior bridging osteophyte at C5-C6 and C6-C7. Upper chest: Visualized lung apices are clear. Other: Approximately 1.2 cm hyperdense soft tissue lesion along the midline at the level of the thyroid cartilage (series 2, image 73), compatible with ectopic thyroid. IMPRESSION: 1. Motion limited study without evidence of acute abnormality in the neck. 2. Prominent anterior bridging osteophyte at C5-C6 and C6-C7, which could potentially represent a source of dysphagia/odynophagia. 3. Approximately 1.2 cm hyperdense lesion along the midline at the level of the thyroid cartilage, compatible with ectopic thyroid (particularly given small left orthotopic thyroid). Recommend correlation with thyroid function labs. Electronically Signed   By:  Margaretha Sheffield M.D.   On: 09/01/2021 11:46   MR BRAIN WO CONTRAST  Result Date: 08/30/2021 CLINICAL DATA:  Mental status change, unknown cause. Weakness and confusion. EXAM: MRI HEAD WITHOUT CONTRAST TECHNIQUE: Multiplanar, multiecho pulse sequences of the brain and surrounding structures were obtained without intravenous contrast. COMPARISON:  Head CT 08/28/2021 FINDINGS: Brain: Diffusion imaging does not show any acute or subacute infarction. Chronic small-vessel ischemic changes affect pons. 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 Advanced chronic small-vessel ischemic changes are present throughout the cerebral hemispheric white matter. No cortical or large vessel territory infarction.  No mass lesion, hemorrhage, hydrocephalus or extra-axial collection. Vascular: Major vessels at the base of the brain show flow. Skull and upper cervical spine: Negative Sinuses/Orbits: Clear/normal Other: None IMPRESSION: No acute MR finding. Chronic small-vessel ischemic changes affecting the pons and cerebral hemispheric white matter. Electronically Signed   By: Nelson Chimes M.D.   On: 08/30/2021 12:56   EEG adult  Result Date: 09/01/2021 Lora Havens, MD     09/01/2021  8:42 AM Patient Name: Patrick Kerr MRN: [id number] Epilepsy Attending: Lora Havens Referring Physician/Provider: Orson Eva, MD Date: 08/31/2021 Duration: 22.39 mins Patient history: 76 year old male with altered mental status and syncope.  EEG to evaluate for seizure. Level of alertness: Awake, asleep AEDs during EEG study: None Technical aspects: This EEG study was done with scalp electrodes positioned according to the 10-20 International system of electrode placement. Electrical activity was acquired at a sampling rate of 500Hz  and reviewed with a high frequency filter of 70Hz  and a low frequency filter of 1Hz . EEG data were recorded continuously and digitally stored. Description: The posterior dominant rhythm consists of 7 Hz  activity of moderate voltage (25-35 uV) seen predominantly in posterior head regions, symmetric and reactive to eye opening and eye closing. Sleep was characterized by sleep spindles (12 to 14 Hz), maximal frontocentral region.  EEG showed continuous generalized 3 to 6 Hz theta-delta slowing. Hyperventilation and photic stimulation were not performed.   ABNORMALITY - Continuous slow, generalized - Background slow IMPRESSION: This study is suggestive of moderate diffuse encephalopathy, nonspecific etiology. No seizures or epileptiform discharges were seen throughout the recording. Lora Havens   ECHOCARDIOGRAM COMPLETE  Result Date: 08/31/2021    ECHOCARDIOGRAM REPORT   Patient Name:   Patrick Kerr Brion Date of Exam: 08/31/2021 Medical Rec #:  [id number]       Height:       76.0 in Accession #:    [id number]      Weight:       260.0 lb Date of Birth:  [birth year]      BSA:          2.477 m Patient Age:    72 years        BP:           89/50 mmHg Patient Gender: M               HR:           101 bpm. Exam Location:  [facility] Procedure: 2D Echo, Cardiac Doppler and Color Doppler Indications:    Syncope  History:        Patient has no prior history of Echocardiogram examinations.                 TIA, Arrythmias:RBBB and Tachycardia, Signs/Symptoms:Syncope;                 Risk Factors:Hypertension, Dyslipidemia and Current Smoker.  Sonographer:    Wenda Low Referring Phys: [phone] DAVID TAT  Sonographer Comments: Image acquisition challenging due to patient behavioral factors. Patient is agitated and continually moving. Limited views obtained. IMPRESSIONS  1. Left ventricular ejection fraction, by estimation, is 60 to 65%. The left ventricle has normal function. Left ventricular endocardial border not optimally defined to evaluate regional wall motion. There is mild left ventricular hypertrophy. Left ventricular diastolic parameters are consistent with Grade I diastolic dysfunction (impaired relaxation).  2.  Right ventricular systolic function is normal. The right ventricular size is normal. Tricuspid regurgitation  signal is inadequate for assessing PA pressure.  3. The mitral valve is normal in structure. No evidence of mitral valve regurgitation. No evidence of mitral stenosis.  4. The aortic valve was not well visualized. There is mild calcification of the aortic valve. There is mild thickening of the aortic valve. Aortic valve regurgitation is not visualized. No aortic stenosis is present. FINDINGS  Left Ventricle: Left ventricular ejection fraction, by estimation, is 60 to 65%. The left ventricle has normal function. Left ventricular endocardial border not optimally defined to evaluate regional wall motion. The left ventricular internal cavity size was normal in size. There is mild left ventricular hypertrophy. Left ventricular diastolic parameters are consistent with Grade I diastolic dysfunction (impaired relaxation). Normal left ventricular filling pressure. Right Ventricle: The right ventricular size is normal. No increase in right ventricular wall thickness. Right ventricular systolic function is normal. Tricuspid regurgitation signal is inadequate for assessing PA pressure. Left Atrium: Left atrial size was not well visualized. Right Atrium: Right atrial size was not well visualized. Pericardium: The pericardium was not well visualized. Mitral Valve: The mitral valve is normal in structure. No evidence of mitral valve regurgitation. No evidence of mitral valve stenosis. MV peak gradient, 6.8 mmHg. The mean mitral valve gradient is 2.0 mmHg. Tricuspid Valve: The tricuspid valve is not well visualized. Tricuspid valve regurgitation is not demonstrated. No evidence of tricuspid stenosis. Aortic Valve: The aortic valve was not well visualized. There is mild calcification of the aortic valve. There is mild thickening of the aortic valve. There is mild aortic valve annular calcification. Aortic valve regurgitation  is not visualized. No aortic stenosis is present. Aortic valve mean gradient measures 3.0 mmHg. Aortic valve peak gradient measures 6.6 mmHg. Aortic valve area, by VTI measures 3.83 cm. Pulmonic Valve: The pulmonic valve was not well visualized. Pulmonic valve regurgitation is not visualized. No evidence of pulmonic stenosis. Aorta: The aortic root is normal in size and structure. Venous: The inferior vena cava was not well visualized. IAS/Shunts: The interatrial septum was not well visualized.  LEFT VENTRICLE PLAX 2D LVIDd:         5.40 cm   Diastology LVIDs:         3.60 cm   LV e' medial:    6.42 cm/s LV PW:         1.20 cm   LV E/e' medial:  8.9 LV IVS:        1.40 cm   LV e' lateral:   13.50 cm/s LVOT diam:     2.20 cm   LV E/e' lateral: 4.3 LV SV:         92 LV SV Index:   37 LVOT Area:     3.80 cm  LEFT ATRIUM         Index LA diam:    4.20 cm 1.70 cm/m  AORTIC VALVE AV Area (Vmax):    3.00 cm AV Area (Vmean):   3.56 cm AV Area (VTI):     3.83 cm AV Vmax:           128.00 cm/s AV Vmean:          82.100 cm/s AV VTI:            0.241 m AV Peak Grad:      6.6 mmHg AV Mean Grad:      3.0 mmHg LVOT Vmax:         101.00 cm/s LVOT Vmean:  76.800 cm/s LVOT VTI:          0.243 m LVOT/AV VTI ratio: 1.01  AORTA Ao Root diam: 3.90 cm Ao Asc diam:  3.40 cm MITRAL VALVE MV Area (PHT): 5.79 cm     SHUNTS MV Area VTI:   4.31 cm     Systemic VTI:  0.24 m MV Peak grad:  6.8 mmHg     Systemic Diam: 2.20 cm MV Mean grad:  2.0 mmHg MV Vmax:       1.30 m/s MV Vmean:      69.3 cm/s MV Decel Time: 131 msec MV E velocity: 57.40 cm/s MV A velocity: 113.00 cm/s MV E/A ratio:  0.51 Carlyle Dolly MD Electronically signed by Carlyle Dolly MD Signature Date/Time: 08/31/2021/3:27:18 PM    Final    CT Renal Stone Study  Result Date: 08/04/2021 CLINICAL DATA:  Foreign body, GU tract oliguria. decreased urinary output and increased generalized weakness since being discharged recently for kidney stone Standard full stone  protocol used EXAM: CT ABDOMEN AND PELVIS WITHOUT CONTRAST TECHNIQUE: Multidetector CT imaging of the abdomen and pelvis was performed following the standard protocol without IV contrast. COMPARISON:  None. FINDINGS: Lower chest: No acute abnormality. Three-vessel coronary artery calcification. Hepatobiliary: No focal liver abnormality. No gallstones, gallbladder wall thickening, or pericholecystic fluid. No biliary dilatation. Pancreas: No focal lesion. Normal pancreatic contour. No surrounding inflammatory changes. No main pancreatic ductal dilatation. Spleen: Normal in size without focal abnormality. Adrenals/Urinary Tract: No adrenal nodule bilaterally. There is a 3 mm calcified stone within the left kidney. Likely punctate calcification within the right kidney. The other calcifications are likely vascular. No hydronephrosis. Bilateral renal cortical scarring. No definite contour-deforming renal mass. No ureterolithiasis or hydroureter. The urinary bladder is decompressed with a urinary bladder tip and balloon terminating within the lumen. Query slight perivesicular fat stranding and hazy urinary bladder wall contour. Stomach/Bowel: Stomach is within normal limits. No evidence of bowel wall thickening or dilatation. Appendix appears normal. Vascular/Lymphatic: No abdominal aorta or iliac aneurysm. Severe atherosclerotic plaque of the aorta and its branches. No abdominal, pelvic, or inguinal lymphadenopathy. Reproductive: Prostatectomy. Other: No intraperitoneal free fluid. No intraperitoneal free gas. No organized fluid collection. Musculoskeletal: Bilateral fat containing small inguinal hernias. No suspicious lytic or blastic osseous lesions. No acute displaced fracture. Multilevel severe degenerative changes of the spine. Diffusely decreased bone density. Grade 1 anterolisthesis of L4 on L5. L3-L5 posterolateral and interbody fusion. No pedicular screw at the L4 level on the left. IMPRESSION: 1. Query slight  perivesicular fat stranding and hazy urinary bladder wall contour. Markedly limited evaluation due to collapse of the urinary bladder with Foley catheter in place. Recommend correlation with urinalysis for infection. 2. Nonobstructive bilateral nephrolithiasis, 3 mm on the left and punctate on the right. 3.  Bilateral fat containing small inguinal hernias. 4.  Aortic Atherosclerosis (ICD10-I70.0). Electronically Signed   By: Iven Finn M.D.   On: 08/04/2021 15:29    Irwin Brakeman, MD   [facility]  If 7PM-7AM, please contact night-coverage [email or web address] Password [facility] 09/01/2021, 3:38 PM   LOS: 4 days

## 2021-09-01 NOTE — Plan of Care (Signed)
  Problem: Education: Goal: Knowledge of General Education information will improve Description Including pain rating scale, medication(s)/side effects and non-pharmacologic comfort measures Outcome: Progressing   Problem: Health Behavior/Discharge Planning: Goal: Ability to manage health-related needs will improve Outcome: Progressing   

## 2021-09-01 NOTE — Procedures (Signed)
Patient Name: Patrick Kerr  MRN: [id number]  Epilepsy Attending: Lora Havens  Referring Physician/Provider: Orson Eva, MD Date: 08/31/2021 Duration: 22.39 mins  Patient history: 76 year old male with altered mental status and syncope.  EEG to evaluate for seizure.  Level of alertness: Awake, asleep  AEDs during EEG study: None  Technical aspects: This EEG study was done with scalp electrodes positioned according to the 10-20 International system of electrode placement. Electrical activity was acquired at a sampling rate of 500Hz  and reviewed with a high frequency filter of 70Hz  and a low frequency filter of 1Hz . EEG data were recorded continuously and digitally stored.   Description: The posterior dominant rhythm consists of 7 Hz activity of moderate voltage (25-35 uV) seen predominantly in posterior head regions, symmetric and reactive to eye opening and eye closing. Sleep was characterized by sleep spindles (12 to 14 Hz), maximal frontocentral region.  EEG showed continuous generalized 3 to 6 Hz theta-delta slowing. Hyperventilation and photic stimulation were not performed.     ABNORMALITY - Continuous slow, generalized - Background slow  IMPRESSION: This study is suggestive of moderate diffuse encephalopathy, nonspecific etiology. No seizures or epileptiform discharges were seen throughout the recording.  Lena Gores Barbra Sarks

## 2021-09-01 NOTE — Evaluation (Signed)
Clinical/Bedside Swallow Evaluation Patient Details  Name: Patrick Kerr MRN: [id number] Date of Birth: 26-Apr-1946  Today's Date: 09/01/2021 Time: SLP Start Time (ACUTE ONLY): 1300 SLP Stop Time (ACUTE ONLY): 1325 SLP Time Calculation (min) (ACUTE ONLY): 25 min  Past Medical History:  Past Medical History:  Diagnosis Date   Arthritis    Hypertension    Past Surgical History:  Past Surgical History:  Procedure Laterality Date   CIRCUMCISION  05/25/2021   TRANSURETHRAL RESECTION OF PROSTATE     HPI:  76 y.o. male with medical history of dementia, BOO, BPH s/p TURP, chronic indwelling Foley catheter, hypothyroidism, hypertension, PTSD presenting with altered mental status.  The patient was recently hospitalized from 07/17/2021 to 07/29/2021 for septic shock due to Klebsiella UTI.  During that hospitalization the patient required intubation.  There was also a question of serotonin syndrome resulting in discontinuation of his Abilify and Zoloft.  The patient was discharged home with home health physical therapy.  The patient returned to the emergency department once again on 08/04/2021 with generalized weakness and irritability.  He was seen by the hospitalist at that time who felt that there was no new acute issue and this most likely is continued natural recovery from his recent prolonged hospitalization.  Once again, the patient was hospitalized at [facility] from 08/18/2021 to 08/21/2021 for agitation, confusion, and aggressive behavior.  It was felt that this was due to the patient's electrolyte derangement, UTI, and being off his psychiatric medications.  He was seen by psychiatry at that time who restarted the patient's Abilify at 7.5 mg daily.  Ativan was given 0.5 mg twice daily during the day and 1 mg at bedtime.  The patient's daughter states that since returning home from [facility], the patient has been doing fairly well up until the morning of 08/28/2021.  After  breakfast, she noted that the patient was somewhat confused.  Caretaker stated that patient was speaking nonsensically and kept repeating the same words.  In addition, the patient was doing nonsensical things with his right arm.  Care taker stated that he was not particularly having any focal weakness, but which is grabbing at things that were not there and also grabbing at objects for no reason.  There was no loss of consciousness.  She feel patient may have been incited by something his spouse said.  EMS was activated, and she stated that although he was a little bit more lucid he still remained somewhat confused in the emergency department. Rapid response was called on 08/31/21 and Pt moved to stepdown. BSE requested.    Assessment / Plan / Recommendation  Clinical Impression  Clinical swallow evaluation completed at bedside with family present. Pt presents with primarily cognitive based dysphagia at this time with poor attention to task. Oral motor examination is WNL. Pt reaching out at things during the evaluation and required cues to use spoon and straw. Attempts were made at self feeding with hand over hand SLP assist. Pt able to hold the cup and take sips with SLP guidance and hold a breadstick with guidance. Pt prolonged oral transit and intermitten reduced labial closure, all due to altered mental status. Recommend D3/mech soft and thin liquids with 1:1 feeder assist and only feed when Pt is alert and upright, ok for po medications whole in puree. Above to family and RN. SLP will check back tomorrow. SLP Visit Diagnosis: Dysphagia, unspecified (R13.10)    Aspiration Risk  Mild aspiration risk  Diet Recommendation Dysphagia 3 (Mech soft);Thin liquid   Liquid Administration via: Cup;Straw Medication Administration: Whole meds with puree Supervision: Staff to assist with self feeding;Full supervision/cueing for compensatory strategies Compensations: Slow rate;Small sips/bites;Multiple dry  swallows after each bite/sip Postural Changes: Seated upright at 90 degrees;Remain upright for at least 30 minutes after po intake    Other  Recommendations Oral Care Recommendations: Oral care BID;Staff/trained caregiver to provide oral care Other Recommendations: Clarify dietary restrictions    Recommendations for follow up therapy are one component of a multi-disciplinary discharge planning process, led by the attending physician.  Recommendations may be updated based on patient status, additional functional criteria and insurance authorization.  Follow up Recommendations Other (comment)      Assistance Recommended at Discharge Frequent or constant Supervision/Assistance  Functional Status Assessment Patient has had a recent decline in their functional status and demonstrates the ability to make significant improvements in function in a reasonable and predictable amount of time.  Frequency and Duration min 2x/week  1 week       Prognosis Prognosis for Safe Diet Advancement: Fair Barriers to Reach Goals: Cognitive deficits      Swallow Study   General Date of Onset: 08/28/21 HPI: 76 y.o. male with medical history of dementia, BOO, BPH s/p TURP, chronic indwelling Foley catheter, hypothyroidism, hypertension, PTSD presenting with altered mental status.  The patient was recently hospitalized from 07/17/2021 to 07/29/2021 for septic shock due to Klebsiella UTI.  During that hospitalization the patient required intubation.  There was also a question of serotonin syndrome resulting in discontinuation of his Abilify and Zoloft.  The patient was discharged home with home health physical therapy.  The patient returned to the emergency department once again on 08/04/2021 with generalized weakness and irritability.  He was seen by the hospitalist at that time who felt that there was no new acute issue and this most likely is continued natural recovery from his recent prolonged hospitalization.  Once  again, the patient was hospitalized at [facility] from 08/18/2021 to 08/21/2021 for agitation, confusion, and aggressive behavior.  It was felt that this was due to the patient's electrolyte derangement, UTI, and being off his psychiatric medications.  He was seen by psychiatry at that time who restarted the patient's Abilify at 7.5 mg daily.  Ativan was given 0.5 mg twice daily during the day and 1 mg at bedtime.  The patient's daughter states that since returning home from [facility], the patient has been doing fairly well up until the morning of 08/28/2021.  After breakfast, she noted that the patient was somewhat confused.  Caretaker stated that patient was speaking nonsensically and kept repeating the same words.  In addition, the patient was doing nonsensical things with his right arm.  Care taker stated that he was not particularly having any focal weakness, but which is grabbing at things that were not there and also grabbing at objects for no reason.  There was no loss of consciousness.  She feel patient may have been incited by something his spouse said.  EMS was activated, and she stated that although he was a little bit more lucid he still remained somewhat confused in the emergency department. Rapid response was called on 08/31/21 and Pt moved to stepdown. BSE requested. Type of Study: Bedside Swallow Evaluation Previous Swallow Assessment: BSE Dec 2022 Diet Prior to this Study: Regular;Thin liquids Temperature Spikes Noted: No Respiratory Status: Room air History of Recent Intubation: No Behavior/Cognition: Cooperative;Confused;Requires cueing Oral Cavity  Assessment: Within Functional Limits Oral Care Completed by SLP: Recent completion by staff Oral Cavity - Dentition: Adequate natural dentition Vision: Impaired for self-feeding Self-Feeding Abilities: Needs assist Patient Positioning: Upright in bed Baseline Vocal Quality: Normal;Low vocal intensity Volitional Cough:  Strong Volitional Swallow: Unable to elicit    Oral/Motor/Sensory Function Overall Oral Motor/Sensory Function: Within functional limits   Ice Chips Ice chips: Within functional limits Presentation: Spoon   Thin Liquid Thin Liquid: Within functional limits Presentation: Cup;Spoon;Straw    Nectar Thick Nectar Thick Liquid: Not tested   Honey Thick Honey Thick Liquid: Not tested   Puree Puree: Impaired Presentation: Spoon Oral Phase Impairments: Reduced lingual movement/coordination;Poor awareness of bolus   Solid     Solid: Impaired Presentation: Spoon;Self Fed Oral Phase Impairments: Poor awareness of bolus Oral Phase Functional Implications: Prolonged oral transit     Thank you,  Havery Moros, CCC-SLP [phone]  Jimmy Stipes 09/01/2021,1:35 PM

## 2021-09-01 NOTE — Telephone Encounter (Signed)
Dr. Retta Diones- pt currently admitted to [facility] ICU-   Please advise on daughters question.

## 2021-09-01 NOTE — Evaluation (Signed)
Physical Therapy Evaluation Patient Details Name: Patrick Kerr MRN: [id number] DOB: 1945/12/22 Today's Date: 09/01/2021  History of Present Illness  Patrick Kerr is a 76 y.o. male with medical history of dementia, BOO, BPH s/p TURP, chronic indwelling Foley catheter, hypothyroidism, hypertension, PTSD presenting with altered mental status.  The patient was recently hospitalized from 07/17/2021 to 07/29/2021 for septic shock due to Klebsiella UTI.  During that hospitalization the patient required intubation.  There was also a question of serotonin syndrome resulting in discontinuation of his Abilify and Zoloft.  The patient was discharged home with home health physical therapy.  The patient returned to the emergency department once again on 08/04/2021 with generalized weakness and irritability.  He was seen by the hospitalist at that time who felt that there was no new acute issue and this most likely is continued natural recovery from his recent prolonged hospitalization.  Once again, the patient was hospitalized at [facility] from 08/18/2021 to 08/21/2021 for agitation, confusion, and aggressive behavior.  It was felt that this was due to the patient's electrolyte derangement, UTI, and being off his psychiatric medications.  He was seen by psychiatry at that time who restarted the patient's Abilify at 7.5 mg daily.  Ativan was given 0.5 mg twice daily during the day and 1 mg at bedtime.  The patient's daughter states that since returning home from [facility], the patient has been doing fairly well up until the morning of 08/28/2021.  After breakfast, she noted that the patient was somewhat confused.  She stated that he had some word finding difficulties and kept repeating the same words.  In addition, the patient was doing nonsensical things with his right arm.  She stated that he was not particularly having any focal weakness, but which is grabbing at things that were not there and  also grabbing at objects for no reason.  There was no loss of consciousness.  EMS was activated, and she stated that although he was a little bit more lucid he still remained somewhat confused in the emergency department.  In fact, at the time of my evaluation, the patient still have difficulty recognizing the patient's daughter, but he was able to speak clearly.  She states that he last had his Foley catheter changed on 08/23/2021.  There is been no reports of headache, chest pain, short of breath, cough, hemoptysis, nausea, vomiting without abdominal pain.   Clinical Impression  Patient demonstrates slow labored movement for sitting up at bedside requiring occasional repeated verbal/tactile cueing to follow directions, very unsteady on feet, at high risk for falls and limited to a few slow labored side steps at bedside before having to sit due to fatigue and poor standing balance.  Patient tolerated sitting up in chair after therapy with his home aides present in room - RN notified.  Patient will benefit from continued skilled physical therapy in hospital and recommended venue below to increase strength, balance, endurance for safe ADLs and gait.          Recommendations for follow up therapy are one component of a multi-disciplinary discharge planning process, led by the attending physician.  Recommendations may be updated based on patient status, additional functional criteria and insurance authorization.  Follow Up Recommendations Skilled nursing-short term rehab (<3 hours/day)    Assistance Recommended at Discharge Frequent or constant Supervision/Assistance  Patient can return home with the following  A lot of help with bathing/dressing/bathroom;A lot of help with walking and/or transfers;Help with stairs  or ramp for entrance;Assistance with feeding;Assistance with cooking/housework    Equipment Recommendations None recommended by PT  Recommendations for Other Services       Functional  Status Assessment Patient has had a recent decline in their functional status and demonstrates the ability to make significant improvements in function in a reasonable and predictable amount of time.     Precautions / Restrictions Precautions Precautions: Fall Restrictions Weight Bearing Restrictions: No      Mobility  Bed Mobility Overal bed mobility: Needs Assistance Bed Mobility: Supine to Sit     Supine to sit: Mod assist     General bed mobility comments: slow labored movement    Transfers Overall transfer level: Needs assistance Equipment used: Rolling walker (2 wheels) Transfers: Sit to/from Stand, Bed to chair/wheelchair/BSC Sit to Stand: Min assist, Mod assist   Step pivot transfers: Mod assist       General transfer comment: very unsteady labored movement    Ambulation/Gait Ambulation/Gait assistance: Mod assist, Max assist Gait Distance (Feet): 5 Feet Assistive device: Rolling walker (2 wheels) Gait Pattern/deviations: Decreased step length - right, Decreased step length - left, Decreased stride length Gait velocity: decreased     General Gait Details: limited to a few slow unsteady labored side steps before having to sit due to weakness and fatigue  Stairs            Wheelchair Mobility    Modified Rankin (Stroke Patients Only)       Balance Overall balance assessment: Needs assistance Sitting-balance support: Feet supported, No upper extremity supported Sitting balance-Leahy Scale: Fair Sitting balance - Comments: seated at EOB   Standing balance support: Reliant on assistive device for balance, During functional activity, Bilateral upper extremity supported Standing balance-Leahy Scale: Poor Standing balance comment: fair/poor using RW                             Pertinent Vitals/Pain Pain Assessment Pain Assessment: No/denies pain    Home Living Family/patient expects to be discharged to:: Private residence Living  Arrangements: Children Available Help at Discharge: Available PRN/intermittently;Personal care attendant;Family Type of Home: House Home Access: Ramped entrance       Home Layout: Two level;Able to live on main level with bedroom/bathroom Home Equipment: Rolling Walker (2 wheels);Cane - single point;Wheelchair - manual;Shower seat - built in;Other (comment)      Prior Function Prior Level of Function : Needs assist       Physical Assist : Mobility (physical);ADLs (physical) Mobility (physical): Bed mobility;Transfers;Gait;Stairs   Mobility Comments: Household ambulator with RW PRN. ADLs Comments: home aides daily     Hand Dominance   Dominant Hand: Right    Extremity/Trunk Assessment   Upper Extremity Assessment Upper Extremity Assessment: Generalized weakness    Lower Extremity Assessment Lower Extremity Assessment: Generalized weakness    Cervical / Trunk Assessment Cervical / Trunk Assessment: Kyphotic  Communication   Communication: No difficulties  Cognition Arousal/Alertness: Awake/alert Behavior During Therapy: WFL for tasks assessed/performed Overall Cognitive Status: History of cognitive impairments - at baseline                                          General Comments      Exercises     Assessment/Plan    PT Assessment Patient needs continued PT services  PT Problem List  Decreased strength;Decreased activity tolerance;Decreased balance;Decreased mobility       PT Treatment Interventions DME instruction;Gait training;Stair training;Functional mobility training;Therapeutic activities;Therapeutic exercise;Patient/family education;Balance training    PT Goals (Current goals can be found in the Care Plan section)  Acute Rehab PT Goals Patient Stated Goal: return home with family/home aides to assist PT Goal Formulation: With patient/family Time For Goal Achievement: 09/15/21 Potential to Achieve Goals: Good    Frequency Min  3X/week     Co-evaluation               AM-PAC PT "6 Clicks" Mobility  Outcome Measure Help needed turning from your back to your side while in a flat bed without using bedrails?: A Little Help needed moving from lying on your back to sitting on the side of a flat bed without using bedrails?: A Lot Help needed moving to and from a bed to a chair (including a wheelchair)?: A Lot Help needed standing up from a chair using your arms (e.g., wheelchair or bedside chair)?: A Lot Help needed to walk in hospital room?: A Lot Help needed climbing 3-5 steps with a railing? : Total 6 Click Score: 12    End of Session   Activity Tolerance: Patient tolerated treatment well;Patient limited by fatigue Patient left: in chair;with call bell/phone within reach;with family/visitor present;with chair alarm set Nurse Communication: Mobility status PT Visit Diagnosis: Unsteadiness on feet (R26.81);Other abnormalities of gait and mobility (R26.89);Muscle weakness (generalized) (M62.81)    Time: 0277-4128 PT Time Calculation (min) (ACUTE ONLY): 30 min   Charges:   PT Evaluation $PT Eval Moderate Complexity: 1 Mod PT Treatments $Therapeutic Activity: 23-37 mins        12:23 PM, 09/01/21 Patrick Kerr, MPT Physical Therapist with [facility] [phone] office [phone] mobile phone

## 2021-09-01 NOTE — TOC Progression Note (Signed)
Transition of Care [facility]) - Progression Note    Patient Details  Name: Patrick Kerr MRN: [id number] Date of Birth: August 09, 1945  Transition of [facility]) CM/SW Contact  Karn Cassis, [place] Phone Number: 09/01/2021, 1:26 PM  Clinical Narrative:  PT evaluated pt and recommend SNF. LCSW discussed with pt's daughter who reports she is HCPOA. Pt's daughter is agreeable to SNF and wants pt to stay local. She reports pt has [facility] and would like for him to stay local ([place] or [place]) instead of going to [place] SNF. Referral started. LCSW received [employer] card from daughter and provided to registration to update chart. CMA notified to start auth.        Barriers to Discharge: Continued Medical Work up  Expected Discharge Plan and Services                                                 Social Determinants of Health (SDOH) Interventions    Readmission Risk Interventions No flowsheet data found.

## 2021-09-01 NOTE — NC FL2 (Signed)
[place] LEVEL OF CARE SCREENING TOOL     IDENTIFICATION  Patient Name: Patrick Kerr Birthdate: 02-06-1946 Sex: male Admission Date (Current Location): 08/28/2021  [facility] and [place] Number:  [employer] and Address:  [place] [address], [place]      Provider Number: [id number]  Attending Physician Name and Address:  Murlean Iba, MD  Relative Name and Phone Number:       Current Level of Care: Hospital Recommended Level of Care: [place] Prior Approval Number:    Date Approved/Denied:   PASRR Number: [id number]  Discharge Plan: SNF    Current Diagnoses: Patient Active Problem List   Diagnosis Date Noted   Orthostatic syncope 09/01/2021   Syncope and collapse [id number]   Acute metabolic encephalopathy [id number]   TIA (transient ischemic attack)    Ureteral calculus, right 08/23/2021   Urinary tract infection without hematuria 08/23/2021   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   Urticaria 08/04/2021   Sciatica 08/04/2021   Acute upper respiratory infection 08/04/2021   Age-related physical debility 08/04/2021   Dermatophytosis of body 08/04/2021   Dyslipidemia 08/04/2021   Dysuria 08/04/2021   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   Neurocognitive disorder 08/04/2021   Other and unspecified hyperlipidemia 08/04/2021   Other B-complex deficiencies 08/04/2021   Other chronic pain 08/04/2021   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   Other seborrheic keratosis 08/04/2021   Encounter for other specified special examinations 08/04/2021   Person encountering health services to consult on behalf of another person 08/04/2021   Other specified counseling 08/04/2021   Other specified disease of sebaceous glands 08/04/2021   Other spondylosis, lumbosacral region 08/04/2021   Lumbosacral spondylosis 08/04/2021   Phimosis 08/04/2021   Rash and other nonspecific skin  eruption 08/04/2021   Venous insufficiency (chronic) (peripheral) 08/04/2021   Vitamin D deficiency 08/04/2021   Hypothyroidism, unspecified 08/04/2021   Thyroid disease 08/04/2021   Benign essential hypertension 08/04/2021   Essential (primary) hypertension 08/04/2021   Anemia 08/04/2021   Unspecified mental disorder due to known physiological condition 08/04/2021   Anxiety disorder, unspecified 08/04/2021   Post-traumatic stress disorder, chronic 08/04/2021   Obesity 08/04/2021   At risk for falls 08/04/2021   Enlarged prostate 08/04/2021   Irritability 08/04/2021   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   Klebsiella sepsis/UTI with Septic Shock 07/29/2021   AKI (acute kidney injury) ([place]) 07/29/2021   Pressure injury of skin 07/24/2021   Sepsis secondary to UTI ([place]) 07/18/2021   Macrocytic anemia 07/18/2021   Hypokalemia 07/18/2021   Thrombocytopenia ([place]) 07/18/2021   Bladder outlet obstruction 07/18/2021   Low TSH level 07/18/2021   Lactic acidosis 07/18/2021   Obesity (BMI 30.0-34.9) 07/18/2021   Essential hypertension 07/18/2021   Acquired hypothyroidism 07/18/2021   Personal history of other malignant neoplasm of skin 11/30/2020   Foot pain 06/04/2020   Hallux valgus 06/04/2020   Hammer toe 06/04/2020   Ingrowing toenail 06/04/2020   Prediabetes 12/31/2019   Protrusion of cervical intervertebral disc 07/20/2017   Status post lumbar spinal fusion 07/20/2017   Acute postoperative pain 06/20/2014   Coagulopathy ([place]) 06/20/2014   Postoperative anemia due to acute blood loss 06/20/2014   Arterial hypotension 06/20/2014   Prostatitis, chronic 05/27/2014   Right lumbar radiculopathy 05/06/2014   Herniated thoracic disc without myelopathy 04/29/2014   Spinal stenosis, cervical region 04/29/2014   PTSD (post-traumatic stress disorder) 01/31/2013   BPH with urinary obstruction 12/27/2012  Depression 12/27/2012   RBBB 12/27/2012   Tachycardia 12/27/2012    Tobacco use disorder 12/27/2012   HTN (hypertension) 12/27/2012   Anxiety 12/27/2012   Acquired spondylolisthesis 12/11/2012   Spinal stenosis of lumbar region with neurogenic claudication 12/11/2012   Hypothyroidism 08/01/1993    Orientation RESPIRATION BLADDER Height & Weight     Self  Normal Continent Weight: 238 lb 5.1 oz (108.1 kg) Height:  6\' 4"  (193 cm)  BEHAVIORAL SYMPTOMS/MOOD NEUROLOGICAL BOWEL NUTRITION STATUS      Incontinent Diet (Heart healthy. See d/c summary for updates.)  AMBULATORY STATUS COMMUNICATION OF NEEDS Skin   Extensive Assist Verbally Bruising, Other (Comment) (Fissure to groin)                       Personal Care Assistance Level of Assistance  Bathing, Feeding, Dressing Bathing Assistance: Maximum assistance Feeding assistance: Limited assistance Dressing Assistance: Maximum assistance     Functional Limitations Info  Sight, Speech, Hearing Sight Info: Impaired Hearing Info: Adequate Speech Info: Adequate    SPECIAL CARE FACTORS FREQUENCY  PT (By licensed PT)     PT Frequency: 5x weekly              Contractures      Additional Factors Info  Code Status, Allergies, Psychotropic Code Status Info: Full code Allergies Info: Sulfamethoxazole-trimethoprim, Amitriptyline, Azithromycin, Erythromycin, Erythromycin Base, Mirabegron, Prednisone, Hydromorphone Psychotropic Info: Abilify, Ativan, Paxil         Current Medications (09/01/2021):  This is the current hospital active medication list Current Facility-Administered Medications  Medication Dose Route Frequency Provider Last Rate Last Admin   0.9 %  sodium chloride infusion   Intravenous Continuous Tat, David, MD 100 mL/hr at 08/31/21 1128 New Bag at 08/31/21 1128   acetaminophen (TYLENOL) tablet 650 mg  650 mg Oral Q6H PRN Tat, Shanon Brow, MD   650 mg at 08/31/21 1455   Or   acetaminophen (TYLENOL) suppository 650 mg  650 mg Rectal Q6H PRN Tat, Shanon Brow, MD   650 mg at 09/01/21 1259    albuterol (PROVENTIL) (2.5 MG/3ML) 0.083% nebulizer solution 2.5 mg  2.5 mg Nebulization Q4H PRN Tat, Shanon Brow, MD       ARIPiprazole (ABILIFY) tablet 7.5 mg  7.5 mg Oral Daily Tat, David, MD   7.5 mg at 09/01/21 [id number]   aspirin tablet 325 mg  325 mg Oral Daily Tat, David, MD   325 mg at 09/01/21 [id number]   ceFEPIme (MAXIPIME) 2 g in sodium chloride 0.9 % 100 mL IVPB  2 g Intravenous Franco Collet, MD 200 mL/hr at 09/01/21 0546 2 g at 09/01/21 0546   Chlorhexidine Gluconate Cloth 2 % PADS 6 each  6 each Topical Daily Tat, David, MD   6 each at 08/31/21 1000   docusate sodium (COLACE) capsule 100 mg  100 mg Oral Daily Tat, David, MD   100 mg at 09/01/21 0919   enoxaparin (LOVENOX) injection 40 mg  40 mg Subcutaneous Q24H Tat, Shanon Brow, MD   40 mg at 08/31/21 1755   feeding supplement (ENSURE ENLIVE / ENSURE PLUS) liquid 237 mL  237 mL Oral TID BM Orson Eva, MD   237 mL at 08/31/21 2000   finasteride (PROSCAR) tablet 5 mg  5 mg Oral Daily Tat, David, MD   5 mg at [id number]   folic acid (FOLVITE) tablet 1 mg  1 mg Oral Daily Tat, David, MD   1 mg at 09/01/21 0919   haloperidol  lactate (HALDOL) injection 5 mg  5 mg Intravenous Q6H PRN Tat, Shanon Brow, MD   5 mg at 08/31/21 0242   levothyroxine (SYNTHROID) tablet 137 mcg  137 mcg Oral QAC breakfast Tat, Shanon Brow, MD   137 mcg at 09/01/21 0543   LORazepam (ATIVAN) injection 1 mg  1 mg Intravenous Q6H PRN Tat, Shanon Brow, MD   1 mg at 09/01/21 0107   LORazepam (ATIVAN) tablet 0.5 mg  0.5 mg Oral BID WC Tat, Shanon Brow, MD   0.5 mg at 09/01/21 0919   LORazepam (ATIVAN) tablet 1 mg  1 mg Oral Benay Pike, MD   1 mg at 08/31/21 1934   nystatin (MYCOSTATIN/NYSTOP) topical powder   Topical BID Tat, Shanon Brow, MD   Given at 09/01/21 0934   ondansetron (ZOFRAN) tablet 4 mg  4 mg Oral Q6H PRN Tat, Shanon Brow, MD       Or   ondansetron [facility]) injection 4 mg  4 mg Intravenous Q6H PRN Tat, Shanon Brow, MD   4 mg at 09/01/21 1234   PARoxetine (PAXIL) tablet 10 mg  10 mg Oral Daily Tat, Shanon Brow, MD    10 mg at 09/01/21 [id number]   polyethylene glycol (MIRALAX / GLYCOLAX) packet 17 g  17 g Oral Daily Tat, Shanon Brow, MD   17 g at 09/01/21 [id number]   tamsulosin (FLOMAX) capsule 0.4 mg  0.4 mg Oral QPC breakfast Tat, David, MD   0.4 mg at 08/31/21 1120   triamcinolone cream (KENALOG) 0.1 % cream   Topical BID Orson Eva, MD   Given at 09/01/21 1236   Vitamin D (Ergocalciferol) (DRISDOL) capsule 50,000 Units  50,000 Units Oral Q7 days Orson Eva, MD   50,000 Units at 08/29/21 [id number]     Discharge Medications: Please see discharge summary for a list of discharge medications.  Relevant Imaging Results:  Relevant Lab Results:   Additional Information Daughter said pt has [facility] Medicare. She reports he received COVID vaccinations. SSN: [id number].  Salome Arnt, LCSW

## 2021-09-02 ENCOUNTER — Inpatient Hospital Stay (HOSPITAL_COMMUNITY): Payer: No Typology Code available for payment source

## 2021-09-02 DIAGNOSIS — N390 Urinary tract infection, site not specified: Secondary | ICD-10-CM | POA: Diagnosis present

## 2021-09-02 MED ORDER — LORAZEPAM 0.5 MG PO TABS
0.2500 mg | ORAL_TABLET | Freq: Two times a day (BID) | ORAL | Status: DC
  Administered 2021-09-03 – 2021-09-06 (×8): 0.25 mg via ORAL
  Filled 2021-09-02 (×8): qty 1

## 2021-09-02 MED ORDER — CIPROFLOXACIN HCL 250 MG PO TABS
500.0000 mg | ORAL_TABLET | Freq: Two times a day (BID) | ORAL | Status: AC
  Administered 2021-09-03 – 2021-09-05 (×6): 500 mg via ORAL
  Filled 2021-09-02 (×6): qty 2

## 2021-09-02 MED ORDER — ARIPIPRAZOLE 5 MG PO TABS: 5.0000 mg | ORAL_TABLET | Freq: Every day | ORAL | Status: DC

## 2021-09-02 MED ORDER — LORAZEPAM 0.5 MG PO TABS
0.5000 mg | ORAL_TABLET | Freq: Every day | ORAL | Status: DC
  Administered 2021-09-02 (×2): 0.5 mg via ORAL
  Filled 2021-09-02 (×2): qty 1

## 2021-09-02 NOTE — Assessment & Plan Note (Addendum)
Continue fall precautions and SNF placement.

## 2021-09-02 NOTE — Progress Notes (Signed)
PROGRESS NOTE   Patrick Kerr  GYJ:[id number] DOB: [birth year] DOA: 08/28/2021 PCP: Ludwig Clarks, FNP   Chief Complaint  Patient presents with   Weakness   Level of care: Telemetry  Brief Admission History:  76 y.o. male with medical history of dementia, BOO, BPH s/p TURP, chronic indwelling Foley catheter, hypothyroidism, hypertension, PTSD presenting with altered mental status.  The patient was recently hospitalized from 07/17/2021 to 07/29/2021 for septic shock due to Klebsiella UTI.  During that hospitalization the patient required intubation.  There was also a question of serotonin syndrome resulting in discontinuation of his Abilify and Zoloft.  The patient was discharged home with home health physical therapy.  The patient returned to the emergency department once again on 08/04/2021 with generalized weakness and irritability.  He was seen by the hospitalist at that time who felt that there was no new acute issue and this most likely is continued natural recovery from his recent prolonged hospitalization.  Once again, the patient was hospitalized at [facility] from 08/18/2021 to 08/21/2021 for agitation, confusion, and aggressive behavior.  It was felt that this was due to the patient's electrolyte derangement, UTI, and being off his psychiatric medications.  He was seen by psychiatry at that time who restarted the patient's Abilify at 7.5 mg daily.  Ativan was given 0.5 mg twice daily during the day and 1 mg at bedtime. The patient's daughter states that since returning home from [place] [facility], the patient has been doing fairly well up until the morning of 08/28/2021.  After breakfast, she noted that the patient was somewhat confused.  Caretaker stated that patient was speaking nonsensically and kept repeating the same words.  In addition, the patient was doing nonsensical things with his right arm.  Care taker stated that he was not particularly having any focal weakness, but  which is grabbing at things that were not there and also grabbing at objects for no reason.  There was no loss of consciousness.  She feel patient may have been incited by something his spouse said.  EMS was activated, and she stated that although he was a little bit more lucid he still remained somewhat confused in the emergency department.  In fact, at the time of my evaluation, the patient still have difficulty recognizing the patient's daughter, but he was able to speak clearly.  She states that he last had his Foley catheter changed on 08/23/2021.  There is been no reports of headache, chest pain, short of breath, cough, hemoptysis, nausea, vomiting without abdominal pain.   ED Patient was afebrile hemodynamically stable with oxygen saturation 97% room air.  BMP showed a sodium 135, potassium 4.2, chloride 104, bicarbonate 29, BUN 12, creatinine 0.9.  AST 19, ALT 14, alk phosphatase 50, total bilirubin 0.4.  WBC 5.2, hemoglobin 1.9, platelets 140,000.  CT of the brain was negative for any acute findings.  UA showed 21-50 WBC.  UDS was negative.   RAPID RESPONSE CALLED 08/31/21 AM -patient walked to bathroom and sat on commode.  NT went to check up on patient and was trying to get pt up when he became minimally responsive.  ("Eyes rolled back").  I arrived as staff was getting patient back to bed.  Patient awakens to tactile stimuli but not following commands or answering questions CBG 129, initial BP 60/41, 99% on 3L. About 10 min after syncope, patient following commands and answers questions.  Repeat vitals 5-10 min after event--HR 80, RR18--137/77, 99% on 3L  2/1: Pt had another near syncopal episode when getting up in chair.  His vitals quickly returned to baseline when lying back down in bed.     Assessment & Plan:   Principal Problem:   Acute metabolic encephalopathy Active Problems:   Pseudomonas UTI   Neurocognitive disorder   Essential hypertension   Orthostatic syncope   PTSD  (post-traumatic stress disorder)   Syncope and collapse   Thrombocytopenia (HCC)   RBBB   Tobacco use disorder   Assessment and Plan: * Acute metabolic encephalopathy- (present on admission) Secondary to Pseudomonas UTI  Improving with antibiotic treatments   Pseudomonas UTI- (present on admission) Patient is improving.  He was initially treated with IV cefepime and now transition to oral ciprofloxacin given culture results.  Neurocognitive disorder- (present on admission) Has been stable on home meds Long discussion with family members that he will need a outpatient neurology appointment for definitive work-up and diagnosis and treatment options and they verbalized understanding. Planning ambulatory referral to neurology at discharge.    Essential hypertension- (present on admission) Well-controlled on current treatments  Orthostatic syncope He has had a thorough work-up in the hospital he has been hydrated with IV fluids.  He had a 2D echocardiogram and MRI and CT scans and physical therapy evaluations.  He is doing better today.  Careful with ambulating.  He remains a high fall risk.  Working on SNF placement.  Syncope and collapse Continue fall precautions and working on SNF placement.  Thrombocytopenia ([place])- (present on admission) Stable no bleeding noted.   DVT prophylaxis: lovenox  Code Status: full  Family Communication: daughter/son in Sports coach updated 2/2 Disposition: anticipating SNF placement  Status is: Inpatient Remains inpatient appropriate because: awaiting placement  Subjective: He denies any specific complaints today.   Objective: Vitals:   09/01/21 2114 09/02/21 0040 09/02/21 0446 09/02/21 0755  BP: 123/71 126/69 114/65 139/64  Pulse: 88 76 74 73  Resp: _0 Temp: 99.5 F (37.5 C) 98.3 F (36.8 C) 98.6 F (37 C) 99.6 F (37.6 C)  TempSrc: Axillary  Oral Axillary  SpO2: 97% 92% 90% 93%  Weight:      Height:        Intake/Output  Summary (Last 24 hours) at 09/02/2021 1519 Last data filed at 09/02/2021 1300 Gross per 24 hour  Intake 654.59 ml  Output 300 ml  Net 354.59 ml   Filed Weights   08/28/21 1032 09/01/21 0555  Weight: 117.9 kg 108.1 kg    Examination: General:  Pt is alert, follows commands appropriately, not in acute distress HEENT: No icterus, No thrush, No neck mass, [place]/AT Cardiovascular: normal S1/S2, no rubs, no gallops Respiratory: good air movement bilateral.  No wheeze Abdomen: Soft/+BS, non tender, non distended, no guarding Extremities: No edema, No lymphangitis, No petechiae, No rashes, no synovitis Neurological: nonfocal findings.    Data Reviewed: I have personally reviewed following labs and imaging studies  CBC: Recent Labs  Lab 08/28/21 1049 08/28/21 1133 08/29/21 0431 08/30/21 0428 08/31/21 0822 09/01/21 0704  WBC 5.2  --  4.7 5.0 5.0 3.8*  NEUTROABS 3.7  --   --   --   --   --   HGB 11.9* 12.6* 10.9* 11.6* 11.4* 10.6*  HCT 36.5* 37.0* 34.0* 36.7* 35.6* 33.6*  MCV 102.5*  --  103.7* 100.8* 99.4 100.9*  PLT 140*  --  128* 138* 156 128*    Basic Metabolic Panel: Recent Labs  Lab 08/29/21 0431 08/30/21 0428  08/31/21 0512 08/31/21 0822 09/01/21 0704  NA 138 141 139 138 140  K 3.7 3.8 3.6 3.4* 3.8  CL 107 109 107 108 109  CO2 _0 GLUCOSE 92 101* 108* 144* 96  BUN _1 CREATININE 0.75 0.79 0.88 0.93 0.78  CALCIUM 8.7* 9.0 9.2 9.0 8.6*  MG  --  1.9 1.9  --  1.9    GFR: Estimated Creatinine Clearance: 105.9 mL/min (by C-G formula based on SCr of 0.78 mg/dL).  Liver Function Tests: Recent Labs  Lab 08/28/21 1049 08/31/21 0822  AST 19 20  ALT 14 14  ALKPHOS 50 50  BILITOT 0.4 0.8  PROT 6.9 7.0  ALBUMIN 3.1* 3.3*    CBG: Recent Labs  Lab 08/31/21 0750  GLUCAP 129*    Recent Results (from the past 240 hour(s))  Resp Panel by RT-PCR (Flu A&B, Covid) Nasopharyngeal Swab     Status: None   Collection Time: 08/28/21 11:17 AM    Specimen: Nasopharyngeal Swab; Nasopharyngeal(NP) swabs in vial transport medium  Result Value Ref Range Status   SARS Coronavirus 2 by RT PCR NEGATIVE NEGATIVE Final    Comment: (NOTE) SARS-CoV-2 target nucleic acids are NOT DETECTED.  The SARS-CoV-2 RNA is generally detectable in upper respiratory specimens during the acute phase of infection. The lowest concentration of SARS-CoV-2 viral copies this assay can detect is 138 copies/mL. A negative result does not preclude SARS-Cov-2 infection and should not be used as the sole basis for treatment or other patient management decisions. A negative result may occur with  improper specimen collection/handling, submission of specimen other than nasopharyngeal swab, presence of viral mutation(s) within the areas targeted by this assay, and inadequate number of viral copies(<138 copies/mL). A negative result must be combined with clinical observations, patient history, and epidemiological information. The expected result is Negative.  Fact Sheet for Patients:  EntrepreneurPulse.com.au  Fact Sheet for Healthcare Providers:  IncredibleEmployment.be  This test is no t yet approved or cleared by the [place] FDA and  has been authorized for detection and/or diagnosis of SARS-CoV-2 by FDA under an Emergency Use Authorization (EUA). This EUA will remain  in effect (meaning this test can be used) for the duration of the COVID-19 declaration under Section 564(b)(1) of the Act, 21 U.S.C.section 360bbb-3(b)(1), unless the authorization is terminated  or revoked sooner.       Influenza A by PCR NEGATIVE NEGATIVE Final   Influenza B by PCR NEGATIVE NEGATIVE Final    Comment: (NOTE) The Xpert Xpress SARS-CoV-2/FLU/RSV plus assay is intended as an aid in the diagnosis of influenza from Nasopharyngeal swab specimens and should not be used as a sole basis for treatment. Nasal washings and aspirates are  unacceptable for Xpert Xpress SARS-CoV-2/FLU/RSV testing.  Fact Sheet for Patients: EntrepreneurPulse.com.au  Fact Sheet for Healthcare Providers: IncredibleEmployment.be  This test is not yet approved or cleared by the [place] FDA and has been authorized for detection and/or diagnosis of SARS-CoV-2 by FDA under an Emergency Use Authorization (EUA). This EUA will remain in effect (meaning this test can be used) for the duration of the COVID-19 declaration under Section 564(b)(1) of the Act, 21 U.S.C. section 360bbb-3(b)(1), unless the authorization is terminated or revoked.  Performed at [facility], [address], [place], [place] [address]   Urine Culture     Status: Abnormal   Collection Time: 08/28/21 12:32 PM   Specimen: Urine, Catheterized  Result Value Ref Range  Status   Specimen Description   Final    URINE, CATHETERIZED Performed at [facility], [address] [place], [place] [address]    Special Requests   Final    NONE Performed at [facility], [address] [place], [place] [address]    Culture >=100,000 COLONIES/mL PSEUDOMONAS AERUGINOSA (A)  Final   Report Status 08/31/2021 FINAL  Final   Organism ID, Bacteria PSEUDOMONAS AERUGINOSA (A)  Final      Susceptibility   Pseudomonas aeruginosa - MIC*    CEFTAZIDIME 4 SENSITIVE Sensitive     CIPROFLOXACIN 0.5 SENSITIVE Sensitive     GENTAMICIN <=1 SENSITIVE Sensitive     IMIPENEM 2 SENSITIVE Sensitive     PIP/TAZO 8 SENSITIVE Sensitive     CEFEPIME 2 SENSITIVE Sensitive     * >=100,000 COLONIES/mL PSEUDOMONAS AERUGINOSA  Culture, blood (routine x 2)     Status: None (Preliminary result)   Collection Time: 08/31/21 10:41 AM   Specimen: BLOOD  Result Value Ref Range Status   Specimen Description BLOOD LEFT ANTECUBITAL  Final   Special Requests   Final    BOTTLES DRAWN AEROBIC AND ANAEROBIC Blood Culture adequate volume   Culture   Final    NO GROWTH 2  DAYS Performed at [facility], [address] [place], [place] [address]    Report Status PENDING  Incomplete  Culture, blood (routine x 2)     Status: None (Preliminary result)   Collection Time: 08/31/21 10:41 AM   Specimen: BLOOD  Result Value Ref Range Status   Specimen Description BLOOD LEFT ANTECUBITAL  Final   Special Requests   Final    BOTTLES DRAWN AEROBIC AND ANAEROBIC Blood Culture results may not be optimal due to an excessive volume of blood received in culture bottles   Culture   Final    NO GROWTH 2 DAYS Performed at [facility] [place], [address], [place], [place] [address]    Report Status PENDING  Incomplete     Radiology Studies: CT HEAD WO CONTRAST (5MM)  Result Date: 09/01/2021 CLINICAL DATA:  Neuro deficit, acute, stroke suspected motion EXAM: CT HEAD WITHOUT CONTRAST TECHNIQUE: Contiguous axial images were obtained from the base of the skull through the vertex without intravenous contrast. RADIATION DOSE REDUCTION: This exam was performed according to the departmental dose-optimization program which includes automated exposure control, adjustment of the mA and/or kV according to patient size and/or use of iterative reconstruction technique. COMPARISON:  CT head 08/30/2021. FINDINGS: Brain: No evidence of acute infarction, hemorrhage, hydrocephalus, extra-axial collection or mass lesion/mass effect. Patchy and confluent white matter hypoattenuation, nonspecific compatible chronic microvascular disease. Vascular: No hyperdense vessel identified. Calcific intracranial atherosclerosis. Skull: No evidence of acute fracture. Sinuses/Orbits: Mild paranasal sinus mucosal thickening. Unremarkable orbits. IMPRESSION: 1. No evidence of acute intracranial abnormality on this motion limited study. 2. Chronic microvascular disease. Electronically Signed   By: Margaretha Sheffield M.D.   On: 09/01/2021 11:31   CT HEAD WO CONTRAST (5MM)  Result Date: 08/31/2021 CLINICAL DATA:  Dementia,  urinary tract infection, altered level of consciousness, aggressive behavior EXAM: CT HEAD WITHOUT CONTRAST TECHNIQUE: Contiguous axial images were obtained from the base of the skull through the vertex without intravenous contrast. RADIATION DOSE REDUCTION: This exam was performed according to the departmental dose-optimization program which includes automated exposure control, adjustment of the mA and/or kV according to patient size and/or use of iterative reconstruction technique. COMPARISON:  08/28/2021, 08/30/2021 FINDINGS: Brain: Stable hypodensities throughout the periventricular white  matter and central pons consistent with chronic small vessel ischemic change. No evidence of acute infarct or hemorrhage. The lateral ventricles and remaining midline structures are unremarkable. No acute extra-axial fluid collections. No mass effect. Vascular: No hyperdense vessel or unexpected calcification. Skull: Normal. Negative for fracture or focal lesion. Sinuses/Orbits: Polypoid mucosal thickening within the right maxillary, left frontal, left ethmoid sinuses. Stable chronic right mastoid effusion. Other: None. IMPRESSION: 1. No acute intracranial process. 2. Stable chronic small-vessel ischemic changes. Electronically Signed   By: Randa Ngo M.D.   On: 08/31/2021 15:36   CT SOFT TISSUE NECK WO CONTRAST  Result Date: 09/01/2021 CLINICAL DATA:  Epiglottitis or tonsillitis suspected Tech note:  New AMS and difficulty swallowing, patient is combative EXAM: CT NECK WITHOUT CONTRAST TECHNIQUE: Multidetector CT imaging of the neck was performed following the standard protocol without intravenous contrast. RADIATION DOSE REDUCTION: This exam was performed according to the departmental dose-optimization program which includes automated exposure control, adjustment of the mA and/or kV according to patient size and/or use of iterative reconstruction technique. COMPARISON:  None. FINDINGS: Motion study.  Within this  limitation: Pharynx and larynx: Limited assessment due to motion and streak artifact from dental amalgam. Within this limitation, no visible mass or clear edema. Salivary glands: No inflammation, mass, or stone. Thyroid: Small left thyroid. Lymph nodes: None enlarged or abnormal density. Vascular: Calcific atherosclerosis. Limited evaluation due to noncontrast technique. Limited intracranial: Negative. Visualized orbits: Negative. Mastoids and visualized paranasal sinuses: Right maxillary sinus retention cyst. Small right mastoid effusion. Skeleton: Severe multilevel degenerative change. Prominent anterior bridging osteophyte at C5-C6 and C6-C7. Upper chest: Visualized lung apices are clear. Other: Approximately 1.2 cm hyperdense soft tissue lesion along the midline at the level of the thyroid cartilage (series 2, image 73), compatible with ectopic thyroid. IMPRESSION: 1. Motion limited study without evidence of acute abnormality in the neck. 2. Prominent anterior bridging osteophyte at C5-C6 and C6-C7, which could potentially represent a source of dysphagia/odynophagia. 3. Approximately 1.2 cm hyperdense lesion along the midline at the level of the thyroid cartilage, compatible with ectopic thyroid (particularly given small left orthotopic thyroid). Recommend correlation with thyroid function labs. Electronically Signed   By: Margaretha Sheffield M.D.   On: 09/01/2021 11:46   DG CHEST PORT 1 VIEW  Result Date: 09/02/2021 CLINICAL DATA:  Cough EXAM: PORTABLE CHEST 1 VIEW COMPARISON:  Previous studies including the examination of 08/09/2021 FINDINGS: Transverse diameter of heart is increased. There are no signs of pulmonary edema or focal pulmonary consolidation. Prominence of interstitial markings in the lower lung fields has not changed significantly. Costophrenic angles are clear. There is no pneumothorax. Metallic sutures seen in the sternum. IMPRESSION: Are no new infiltrates or signs of pulmonary edema.  Electronically Signed   By: Elmer Picker M.D.   On: 09/02/2021 08:55   EEG adult  Result Date: 09/01/2021 Lora Havens, MD     09/01/2021  8:42 AM Patient Name: Patrick Kerr MRN: [id number] Epilepsy Attending: Lora Havens Referring Physician/Provider: Orson Eva, MD Date: 08/31/2021 Duration: 22.39 mins Patient history: 76 year old male with altered mental status and syncope.  EEG to evaluate for seizure. Level of alertness: Awake, asleep AEDs during EEG study: None Technical aspects: This EEG study was done with scalp electrodes positioned according to the 10-20 International system of electrode placement. Electrical activity was acquired at a sampling rate of _0  and reviewed with a high frequency filter of _1  and a low frequency filter of _2 . EEG data were  recorded continuously and digitally stored. Description: The posterior dominant rhythm consists of 7 Hz activity of moderate voltage (25-35 uV) seen predominantly in posterior head regions, symmetric and reactive to eye opening and eye closing. Sleep was characterized by sleep spindles (12 to 14 Hz), maximal frontocentral region.  EEG showed continuous generalized 3 to 6 Hz theta-delta slowing. Hyperventilation and photic stimulation were not performed.   ABNORMALITY - Continuous slow, generalized - Background slow IMPRESSION: This study is suggestive of moderate diffuse encephalopathy, nonspecific etiology. No seizures or epileptiform discharges were seen throughout the recording. Priyanka Barbra Sarks    Scheduled Meds:  ARIPiprazole  5 mg Oral Daily   aspirin  325 mg Oral Daily   Chlorhexidine Gluconate Cloth  6 each Topical Daily   ciprofloxacin  500 mg Oral BID   docusate sodium  100 mg Oral Daily   enoxaparin (LOVENOX) injection  40 mg Subcutaneous Q24H   feeding supplement  237 mL Oral TID BM   finasteride  5 mg Oral Daily   folic acid  1 mg Oral Daily   levothyroxine  137 mcg Oral QAC breakfast   LORazepam  0.25 mg Oral  BID WC   LORazepam  0.5 mg Oral QHS   melatonin  6 mg Oral QHS   nystatin   Topical BID   PARoxetine  10 mg Oral Daily   polyethylene glycol  17 g Oral Daily   tamsulosin  0.4 mg Oral QPC breakfast   triamcinolone cream   Topical BID   Vitamin D (Ergocalciferol)  50,000 Units Oral Q7 days   Continuous Infusions:  sodium chloride 10 mL/hr at 09/02/21 1154    LOS: 5 days   Time spent: 35 mins   Fabiano Ginley Wynetta Emery, MD How to contact the [facility] Attending or Consulting provider [place] or covering provider during after hours [place], for this patient?  Check the care team in [facility] and look for a) attending/consulting TRH provider listed and b) the [facility] team listed Log into [email or web address] and use [place]'s universal password to access. If you do not have the password, please contact the hospital operator. Locate the [facility] provider you are looking for under [facility] and page to a number that you can be directly reached. If you still have difficulty reaching the provider, please page the [facility] (Director on Call) for the Hospitalists listed on amion for assistance.  09/02/2021, 3:19 PM

## 2021-09-02 NOTE — Progress Notes (Signed)
Patient's caregiver, Shanda Bumps,  showed me a text message from patient's daughter, Helmut Muster. She wants patient tested for Lewey body dementia by a neurologist. She would like [place] to d/c the ativan, paxil, and abilify until then. She is also concerned about aspiration and is requesting a chest xray.

## 2021-09-02 NOTE — TOC Progression Note (Signed)
Transition of Care [facility]) - Progression Note    Patient Details  Name: MILLIE SHORB MRN: [id number] Date of Birth: 1946/03/03  Transition of Care [facility] CM/SW Contact  Elliot Gault, LCSW Phone Number: 09/02/2021, 3:48 PM  Clinical Narrative:     TOC following. Have spoken with pt's daughter multiple times today to update on bed search for SNF. Have referred pt out to additional Snfs at daughter's request. Awaiting decision from two of the additional facilities. Pt's insurance [employer] Harvey is awaiting notification of selected facility. Anticipating dc in AM and MD states he is hopeful for pt to dc tomorrow.  TOC will follow up in AM.    Barriers to Discharge: Continued Medical Work up  Expected Discharge Plan and Services                                                 Social Determinants of Health (SDOH) Interventions    Readmission Risk Interventions No flowsheet data found.

## 2021-09-02 NOTE — Assessment & Plan Note (Addendum)
RESOLVED NOW. Secondary to Pseudomonas UTI  Improved with antibiotic treatments

## 2021-09-02 NOTE — Hospital Course (Addendum)
76 y.o. male with medical history of dementia, BOO, BPH s/p TURP, chronic indwelling Foley catheter, hypothyroidism, hypertension, PTSD presenting with altered mental status.  The patient was recently hospitalized from 07/17/2021 to 07/29/2021 for septic shock due to Klebsiella UTI.  During that hospitalization the patient required intubation.  There was also a question of serotonin syndrome resulting in discontinuation of his Abilify and Zoloft.  The patient was discharged home with home health physical therapy.  The patient returned to the emergency department once again on 08/04/2021 with generalized weakness and irritability.  He was seen by the hospitalist at that time who felt that there was no new acute issue and this most likely is continued natural recovery from his recent prolonged hospitalization.  Once again, the patient was hospitalized at [facility] from 08/18/2021 to 08/21/2021 for agitation, confusion, and aggressive behavior.  It was felt that this was due to the patient's electrolyte derangement, UTI, and being off his psychiatric medications.  He was seen by psychiatry at that time who restarted the patient's Abilify at 7.5 mg daily.  Ativan was given 0.5 mg twice daily during the day and 1 mg at bedtime. The patient's daughter states that since returning home from [place] [facility], the patient has been doing fairly well up until the morning of 08/28/2021.  After breakfast, she noted that the patient was somewhat confused.  Caretaker stated that patient was speaking nonsensically and kept repeating the same words.  In addition, the patient was doing nonsensical things with his right arm.  Care taker stated that he was not particularly having any focal weakness, but which is grabbing at things that were not there and also grabbing at objects for no reason.  There was no loss of consciousness.  She feel patient may have been incited by something his spouse said.  EMS was activated, and  she stated that although he was a little bit more lucid he still remained somewhat confused in the emergency department.  In fact, at the time of my evaluation, the patient still have difficulty recognizing the patient's daughter, but he was able to speak clearly.  She states that he last had his Foley catheter changed on 08/23/2021.  There is been no reports of headache, chest pain, short of breath, cough, hemoptysis, nausea, vomiting without abdominal pain.   ED Patient was afebrile hemodynamically stable with oxygen saturation 97% room air.  BMP showed a sodium 135, potassium 4.2, chloride 104, bicarbonate 29, BUN 12, creatinine 0.9.  AST 19, ALT 14, alk phosphatase 50, total bilirubin 0.4.  WBC 5.2, hemoglobin 1.9, platelets 140,000.  CT of the brain was negative for any acute findings.  UA showed 21-50 WBC.  UDS was negative.   RR CALLED 08/31/21 -patient walked to bathroom and sat on commode.  NT went to check up on patient and was trying to get pt up when he became minimally responsive.  ("Eyes rolled back").  I arrived as staff was getting patient back to bed.  Patient awakens to tactile stimuli but not following commands or answering questions CBG 129, initial BP 60/41, 99% on 3L. About 10 min after syncope, patient following commands and answers questions.  Repeat vitals 5-10 min after event--HR 80, RR18--137/77, 99% on 3L   2/1: Pt had another near syncopal episode when getting up in chair.  His vitals quickly returned to baseline when lying back down in bed.  2/2 had some overnight sundowning (abilify and lorazepam had been held)  2/3 working  on SNF placement  with TOC. (Resumed abilify and lorazepam), Remains medically stable for DC.  [facility] planning to come [facility] patient on 2/5 to assess  2/5: Coudet catheter placed for urinary retention, will plan to keep catheter in place until he can follow up with Dr. Diona Fanti his urologist.   Pt has appointment with urology on 09/14/21.   2/6: Social  worker confirms patient accepted at [facility]  and can discharge today.

## 2021-09-02 NOTE — Assessment & Plan Note (Addendum)
Well-controlled in hospital.

## 2021-09-02 NOTE — Assessment & Plan Note (Signed)
Has been stable on home meds Long discussion with family members that he will need a outpatient neurology appointment for definitive work-up and diagnosis and treatment options and they verbalized understanding. Planning ambulatory referral to neurology at discharge.

## 2021-09-02 NOTE — Assessment & Plan Note (Addendum)
He has had a thorough work-up in the hospital he has been hydrated with IV fluids.  He had a 2D echocardiogram and MRI and CT scans and physical therapy evaluations.  He is doing better today.  Careful with ambulating.  He remains a high fall risk.  Working on SNF placement.

## 2021-09-02 NOTE — Assessment & Plan Note (Signed)
Stable no bleeding noted.

## 2021-09-02 NOTE — Assessment & Plan Note (Addendum)
Patient is improving.  He was initially treated with IV cefepime and now transitioned to oral ciprofloxacin given culture results and completed oral ciprofloxacin.

## 2021-09-03 MED ORDER — ARIPIPRAZOLE 2 MG PO TABS
2.0000 mg | ORAL_TABLET | Freq: Every day | ORAL | Status: DC
  Administered 2021-09-03 – 2021-09-04 (×2): 2 mg via ORAL
  Filled 2021-09-03 (×4): qty 1

## 2021-09-03 MED ORDER — LORAZEPAM 2 MG/ML IJ SOLN
1.0000 mg | Freq: Once | INTRAMUSCULAR | Status: AC
  Administered 2021-09-03: 1 mg via INTRAVENOUS
  Filled 2021-09-03: qty 1

## 2021-09-03 MED ORDER — HALOPERIDOL LACTATE 5 MG/ML IJ SOLN
2.0000 mg | Freq: Four times a day (QID) | INTRAMUSCULAR | Status: DC | PRN
  Administered 2021-09-03 – 2021-09-04 (×3): 2 mg via INTRAVENOUS
  Filled 2021-09-03 (×3): qty 1

## 2021-09-03 MED ORDER — LORAZEPAM 1 MG PO TABS
1.0000 mg | ORAL_TABLET | Freq: Every day | ORAL | Status: DC
  Administered 2021-09-03 – 2021-09-05 (×3): 1 mg via ORAL
  Filled 2021-09-03 (×3): qty 1

## 2021-09-03 NOTE — TOC Progression Note (Signed)
Transition of Care [facility]) - Progression Note    Patient Details  Name: Patrick Kerr MRN: [id number] Date of Birth: [birth year]  [facility] of [facility] CM/SW Contact  Elliot Gault, LCSW Phone Number: 09/03/2021, 4:10 PM  Clinical Narrative:      TOC following for dc planning. Plan remains for SNF at dc. Anticipating dc Monday. Have spoken with pt's daughter and SNF multiple times today to finalize.   Updated MD. Will follow.   Barriers to Discharge: Continued Medical Work up  Expected Discharge Plan and Services                                                 Social Determinants of Health (SDOH) Interventions    Readmission Risk Interventions No flowsheet data found.

## 2021-09-03 NOTE — Progress Notes (Signed)
Speech Language Pathology Treatment: Dysphagia  Patient Details Name: Patrick Kerr MRN: [id number] DOB: 01/28/1946 Today's Date: 09/03/2021 Time: 1200-1222 SLP Time Calculation (min) (ACUTE ONLY): 22 min  Assessment / Plan / Recommendation Clinical Impression  Pt seen for f/u dysphagia tx d/t cognitive-based dysphagia noted during completion of BSE.  Dysphagia 3/thin liquids observed with min verbal cues provided for small sips/bites and maintaining attention vs speaking during consumption.  Personal nurse in attendance during session with education provided re: optimal times to eat/drink when mentation improved, decreased distractions, and providing mod-max cues for maintaining attention during consumption of all POs with aspiration precautions implemented during all PO intake.  Pt did not exhibit any overt s/s of aspiration during this session, but nurse stated pt coughs depending on alertness level/cognitive status during meals.  Overall oral manipulation/propulsion improved this session with no oral holding noted during consumption of a soft food (banana)/thin liquids via cup/straw with mod verbal/tactile cues for small sips provided.  Recommend continuing Dysphagia 3/thin liquid diet  when pt is ALERT/mentation improved for all oral intake with MOD CUES and FULL SUPERVISION provided during intake.  Medications provided within puree/whole may be beneficial as well.  ST will s/o in acute setting d/t goals met.  Re-consult if symptoms persist.    HPI HPI: 76 y.o. male with medical history of dementia, BOO, BPH s/p TURP, chronic indwelling Foley catheter, hypothyroidism, hypertension, PTSD presenting with altered mental status.  The patient was recently hospitalized from 07/17/2021 to 07/29/2021 for septic shock due to Klebsiella UTI.  During that hospitalization the patient required intubation.  There was also a question of serotonin syndrome resulting in discontinuation of his Abilify and Zoloft.  The  patient was discharged home with home health physical therapy.  The patient returned to the emergency department once again on 08/04/2021 with generalized weakness and irritability.  He was seen by the hospitalist at that time who felt that there was no new acute issue and this most likely is continued natural recovery from his recent prolonged hospitalization.  Once again, the patient was hospitalized at [facility] from 08/18/2021 to 08/21/2021 for agitation, confusion, and aggressive behavior.  It was felt that this was due to the patient's electrolyte derangement, UTI, and being off his psychiatric medications.  He was seen by psychiatry at that time who restarted the patient's Abilify at 7.5 mg daily.  Ativan was given 0.5 mg twice daily during the day and 1 mg at bedtime.  The patient's daughter states that since returning home from [place] [facility], the patient has been doing fairly well up until the morning of 08/28/2021.  After breakfast, she noted that the patient was somewhat confused.  Caretaker stated that patient was speaking nonsensically and kept repeating the same words.  In addition, the patient was doing nonsensical things with his right arm.  Care taker stated that he was not particularly having any focal weakness, but which is grabbing at things that were not there and also grabbing at objects for no reason.  There was no loss of consciousness.  She feel patient may have been incited by something his spouse said.  EMS was activated, and she stated that although he was a little bit more lucid he still remained somewhat confused in the emergency department. Rapid response was called on 08/31/21 and Pt moved to stepdown. BSE completed with Dysphagia 3/thin liquids recommended and cognitive-based dysphagia noted. ST f/u for dysphagia intervention.      SLP Plan  Discharge  SLP treatment due to (comment) (goals obtained)      Recommendations for follow up therapy are one component of  a multi-disciplinary discharge planning process, led by the attending physician.  Recommendations may be updated based on patient status, additional functional criteria and insurance authorization.    Recommendations  Diet recommendations: Dysphagia 3 (mechanical soft);Thin liquid Liquids provided via: Cup;Straw Medication Administration: Whole meds with puree Supervision: Patient able to self feed;Intermittent supervision to cue for compensatory strategies;Staff to assist with self feeding Compensations: Slow rate;Small sips/bites;Minimize environmental distractions;Multiple dry swallows after each bite/sip Postural Changes and/or Swallow Maneuvers: Seated upright 90 degrees                Oral Care Recommendations: Oral care BID;Staff/trained caregiver to provide oral care Follow Up Recommendations:  (TBD) Assistance recommended at discharge: Frequent or constant Supervision/Assistance SLP Visit Diagnosis: Dysphagia, unspecified (R13.10) Plan: Discharge SLP treatment due to (comment) (goals obtained)           Elvina Sidle, M.S., CCC-SLP  09/03/2021, 1:14 PM

## 2021-09-03 NOTE — Progress Notes (Signed)
PROGRESS NOTE   Patrick Kerr  YPP:[id number] DOB: 17-Aug-1945 DOA: 08/28/2021 PCP: Ludwig Clarks, FNP   Chief Complaint  Patient presents with   Weakness   Level of care: Med-Surg  Brief Admission History:  76 y.o. male with medical history of dementia, BOO, BPH s/p TURP, chronic indwelling Foley catheter, hypothyroidism, hypertension, PTSD presenting with altered mental status.  The patient was recently hospitalized from 07/17/2021 to 07/29/2021 for septic shock due to Klebsiella UTI.  During that hospitalization the patient required intubation.  There was also a question of serotonin syndrome resulting in discontinuation of his Abilify and Zoloft.  The patient was discharged home with home health physical therapy.  The patient returned to the emergency department once again on 08/04/2021 with generalized weakness and irritability.  He was seen by the hospitalist at that time who felt that there was no new acute issue and this most likely is continued natural recovery from his recent prolonged hospitalization.  Once again, the patient was hospitalized at [facility] from 08/18/2021 to 08/21/2021 for agitation, confusion, and aggressive behavior.  It was felt that this was due to the patient's electrolyte derangement, UTI, and being off his psychiatric medications.  He was seen by psychiatry at that time who restarted the patient's Abilify at 7.5 mg daily.  Ativan was given 0.5 mg twice daily during the day and 1 mg at bedtime. The patient's daughter states that since returning home from [place] [facility], the patient has been doing fairly well up until the morning of 08/28/2021.  After breakfast, she noted that the patient was somewhat confused.  Caretaker stated that patient was speaking nonsensically and kept repeating the same words.  In addition, the patient was doing nonsensical things with his right arm.  Care taker stated that he was not particularly having any focal weakness, but  which is grabbing at things that were not there and also grabbing at objects for no reason.  There was no loss of consciousness.  She feel patient may have been incited by something his spouse said.  EMS was activated, and she stated that although he was a little bit more lucid he still remained somewhat confused in the emergency department.  In fact, at the time of my evaluation, the patient still have difficulty recognizing the patient's daughter, but he was able to speak clearly.  She states that he last had his Foley catheter changed on 08/23/2021.  There is been no reports of headache, chest pain, short of breath, cough, hemoptysis, nausea, vomiting without abdominal pain.   ED Patient was afebrile hemodynamically stable with oxygen saturation 97% room air.  BMP showed a sodium 135, potassium 4.2, chloride 104, bicarbonate 29, BUN 12, creatinine 0.9.  AST 19, ALT 14, alk phosphatase 50, total bilirubin 0.4.  WBC 5.2, hemoglobin 1.9, platelets 140,000.  CT of the brain was negative for any acute findings.  UA showed 21-50 WBC.  UDS was negative.   RAPID RESPONSE CALLED 08/31/21 AM -patient walked to bathroom and sat on commode.  NT went to check up on patient and was trying to get pt up when he became minimally responsive.  ("Eyes rolled back").  I arrived as staff was getting patient back to bed.  Patient awakens to tactile stimuli but not following commands or answering questions CBG 129, initial BP 60/41, 99% on 3L. About 10 min after syncope, patient following commands and answers questions.  Repeat vitals 5-10 min after event--HR 80, RR18--137/77, 99% on 3L  2/1: Pt had another near syncopal episode when getting up in chair.  His vitals quickly returned to baseline when lying back down in bed.     Assessment & Plan:   Principal Problem:   Acute metabolic encephalopathy Active Problems:   Pseudomonas UTI   Neurocognitive disorder   Essential hypertension   Orthostatic syncope   PTSD  (post-traumatic stress disorder)   Syncope and collapse   Thrombocytopenia (HCC)   RBBB   Tobacco use disorder   Assessment and Plan: * Acute metabolic encephalopathy- (present on admission) Secondary to Pseudomonas UTI  Improving with antibiotic treatments   Pseudomonas UTI- (present on admission) Patient is improving.  He was initially treated with IV cefepime and now transition to oral ciprofloxacin given culture results.  Neurocognitive disorder- (present on admission) Has been stable on home meds Long discussion with family members that he will need a outpatient neurology appointment for definitive work-up and diagnosis and treatment options and they verbalized understanding. Planning ambulatory referral to neurology at discharge.    Essential hypertension- (present on admission) Well-controlled on current treatments  Orthostatic syncope He has had a thorough work-up in the hospital he has been hydrated with IV fluids.  He had a 2D echocardiogram and MRI and CT scans and physical therapy evaluations.  He is doing better today.  Careful with ambulating.  He remains a high fall risk.  Working on SNF placement.  Syncope and collapse Continue fall precautions and working on SNF placement.  Thrombocytopenia ([place])- (present on admission) Stable no bleeding noted.   DVT prophylaxis: lovenox  Code Status: full  Family Communication: daughter/son in Sports coach updated 2/2, 2/3 Disposition: anticipating SNF placement - TOC [facility] [place] Status is: Inpatient Remains inpatient appropriate because: awaiting placement  Subjective: He denies any specific complaints today.   Objective: Vitals:   09/02/21 0446 09/02/21 0755 09/03/21 0510 09/03/21 1408  BP: 114/65 139/64 137/82 (!) 158/78  Pulse: 74 73 67 64  Resp: 18 18  18   Temp: 98.6 F (37 C) 99.6 F (37.6 C) (!) 97.4 F (36.3 C) 98.1 F (36.7 C)  TempSrc: Oral Axillary Oral Axillary   SpO2: 90% 93% 95% 100%  Weight:      Height:        Intake/Output Summary (Last 24 hours) at 09/03/2021 1602 Last data filed at 09/03/2021 1400 Gross per 24 hour  Intake 150 ml  Output 900 ml  Net -750 ml   Filed Weights   08/28/21 1032 09/01/21 0555  Weight: 117.9 kg 108.1 kg    Examination: General:  Pt is awake, he answers questions, he is confused at times, follows commands, not in acute distress, he remains confused with dementia.  HEENT: No icterus, No thrush, No neck mass, [place]/AT Cardiovascular: normal S1/S2, no rubs, no gallops Respiratory: good air movement bilateral.  No wheeze Abdomen: Soft/+BS, non tender, non distended, no guarding Extremities: No edema, No lymphangitis, No petechiae, No rashes, no synovitis Neurological: nonfocal findings.    Data Reviewed: I have personally reviewed following labs and imaging studies  CBC: Recent Labs  Lab 08/28/21 1049 08/28/21 1133 08/29/21 0431 08/30/21 0428 08/31/21 0822 09/01/21 0704  WBC 5.2  --  4.7 5.0 5.0 3.8*  NEUTROABS 3.7  --   --   --   --   --   HGB 11.9* 12.6* 10.9* 11.6* 11.4* 10.6*  HCT 36.5* 37.0* 34.0* 36.7* 35.6* 33.6*  MCV 102.5*  --  103.7*  100.8* 99.4 100.9*  PLT 140*  --  128* 138* 156 128*    Basic Metabolic Panel: Recent Labs  Lab 08/29/21 0431 08/30/21 0428 08/31/21 0512 08/31/21 0822 09/01/21 0704  NA 138 141 139 138 140  K 3.7 3.8 3.6 3.4* 3.8  CL 107 109 107 108 109  CO2 25 27 24 23 25   GLUCOSE 92 101* 108* 144* 96  BUN 11 10 14 14 12   CREATININE 0.75 0.79 0.88 0.93 0.78  CALCIUM 8.7* 9.0 9.2 9.0 8.6*  MG  --  1.9 1.9  --  1.9    GFR: Estimated Creatinine Clearance: 105.9 mL/min (by C-G formula based on SCr of 0.78 mg/dL).  Liver Function Tests: Recent Labs  Lab 08/28/21 1049 08/31/21 0822  AST 19 20  ALT 14 14  ALKPHOS 50 50  BILITOT 0.4 0.8  PROT 6.9 7.0  ALBUMIN 3.1* 3.3*    CBG: Recent Labs  Lab 08/31/21 0750  GLUCAP 129*    Recent Results (from the  past 240 hour(s))  Resp Panel by RT-PCR (Flu A&B, Covid) Nasopharyngeal Swab     Status: None   Collection Time: 08/28/21 11:17 AM   Specimen: Nasopharyngeal Swab; Nasopharyngeal(NP) swabs in vial transport medium  Result Value Ref Range Status   SARS Coronavirus 2 by RT PCR NEGATIVE NEGATIVE Final    Comment: (NOTE) SARS-CoV-2 target nucleic acids are NOT DETECTED.  The SARS-CoV-2 RNA is generally detectable in upper respiratory specimens during the acute phase of infection. The lowest concentration of SARS-CoV-2 viral copies this assay can detect is 138 copies/mL. A negative result does not preclude SARS-Cov-2 infection and should not be used as the sole basis for treatment or other patient management decisions. A negative result may occur with  improper specimen collection/handling, submission of specimen other than nasopharyngeal swab, presence of viral mutation(s) within the areas targeted by this assay, and inadequate number of viral copies(<138 copies/mL). A negative result must be combined with clinical observations, patient history, and epidemiological information. The expected result is Negative.  Fact Sheet for Patients:  EntrepreneurPulse.com.au  Fact Sheet for Healthcare Providers:  IncredibleEmployment.be  This test is no t yet approved or cleared by the [place] FDA and  has been authorized for detection and/or diagnosis of SARS-CoV-2 by FDA under an Emergency Use Authorization (EUA). This EUA will remain  in effect (meaning this test can be used) for the duration of the COVID-19 declaration under Section 564(b)(1) of the Act, 21 U.S.C.section 360bbb-3(b)(1), unless the authorization is terminated  or revoked sooner.       Influenza A by PCR NEGATIVE NEGATIVE Final   Influenza B by PCR NEGATIVE NEGATIVE Final    Comment: (NOTE) The Xpert Xpress SARS-CoV-2/FLU/RSV plus assay is intended as an aid in the diagnosis of  influenza from Nasopharyngeal swab specimens and should not be used as a sole basis for treatment. Nasal washings and aspirates are unacceptable for Xpert Xpress SARS-CoV-2/FLU/RSV testing.  Fact Sheet for Patients: EntrepreneurPulse.com.au  Fact Sheet for Healthcare Providers: IncredibleEmployment.be  This test is not yet approved or cleared by the [place] FDA and has been authorized for detection and/or diagnosis of SARS-CoV-2 by FDA under an Emergency Use Authorization (EUA). This EUA will remain in effect (meaning this test can be used) for the duration of the COVID-19 declaration under Section 564(b)(1) of the Act, 21 U.S.C. section 360bbb-3(b)(1), unless the authorization is terminated or revoked.  Performed at [facility], [address], [place], [place] [address]  Urine Culture     Status: Abnormal   Collection Time: 08/28/21 12:32 PM   Specimen: Urine, Catheterized  Result Value Ref Range Status   Specimen Description   Final    URINE, CATHETERIZED Performed at [facility], [address] [place], [place] [address]    Special Requests   Final    NONE Performed at [facility], [address], [place], [place] [address]    Culture >=100,000 COLONIES/mL PSEUDOMONAS AERUGINOSA (A)  Final   Report Status 08/31/2021 FINAL  Final   Organism ID, Bacteria PSEUDOMONAS AERUGINOSA (A)  Final      Susceptibility   Pseudomonas aeruginosa - MIC*    CEFTAZIDIME 4 SENSITIVE Sensitive     CIPROFLOXACIN 0.5 SENSITIVE Sensitive     GENTAMICIN <=1 SENSITIVE Sensitive     IMIPENEM 2 SENSITIVE Sensitive     PIP/TAZO 8 SENSITIVE Sensitive     CEFEPIME 2 SENSITIVE Sensitive     * >=100,000 COLONIES/mL PSEUDOMONAS AERUGINOSA  Culture, blood (routine x 2)     Status: None (Preliminary result)   Collection Time: 08/31/21 10:41 AM   Specimen: BLOOD  Result Value Ref Range Status   Specimen Description BLOOD LEFT ANTECUBITAL  Final   Special  Requests   Final    BOTTLES DRAWN AEROBIC AND ANAEROBIC Blood Culture adequate volume   Culture   Final    NO GROWTH 3 DAYS Performed at [facility], [address], [place], [place] [address]    Report Status PENDING  Incomplete  Culture, blood (routine x 2)     Status: None (Preliminary result)   Collection Time: 08/31/21 10:41 AM   Specimen: BLOOD  Result Value Ref Range Status   Specimen Description BLOOD LEFT ANTECUBITAL  Final   Special Requests   Final    BOTTLES DRAWN AEROBIC AND ANAEROBIC Blood Culture results may not be optimal due to an excessive volume of blood received in culture bottles   Culture   Final    NO GROWTH 3 DAYS Performed at [facility], [address], [place], [place] [address]    Report Status PENDING  Incomplete     Radiology Studies: DG CHEST PORT 1 VIEW  Result Date: 09/02/2021 CLINICAL DATA:  Cough EXAM: PORTABLE CHEST 1 VIEW COMPARISON:  Previous studies including the examination of 08/09/2021 FINDINGS: Transverse diameter of heart is increased. There are no signs of pulmonary edema or focal pulmonary consolidation. Prominence of interstitial markings in the lower lung fields has not changed significantly. Costophrenic angles are clear. There is no pneumothorax. Metallic sutures seen in the sternum. IMPRESSION: Are no new infiltrates or signs of pulmonary edema. Electronically Signed   By: Elmer Picker M.D.   On: 09/02/2021 08:55    Scheduled Meds:  ARIPiprazole  2 mg Oral Daily   aspirin  325 mg Oral Daily   Chlorhexidine Gluconate Cloth  6 each Topical Daily   ciprofloxacin  500 mg Oral BID   docusate sodium  100 mg Oral Daily   enoxaparin (LOVENOX) injection  40 mg Subcutaneous Q24H   feeding supplement  237 mL Oral TID BM   finasteride  5 mg Oral Daily   folic acid  1 mg Oral Daily   levothyroxine  137 mcg Oral QAC breakfast   LORazepam  0.25 mg Oral BID WC   LORazepam  0.5 mg Oral QHS   melatonin  6 mg Oral QHS   nystatin   Topical  BID   PARoxetine  10 mg  Oral Daily   polyethylene glycol  17 g Oral Daily   tamsulosin  0.4 mg Oral QPC breakfast   Vitamin D (Ergocalciferol)  50,000 Units Oral Q7 days   Continuous Infusions:  sodium chloride 10 mL/hr at 09/02/21 1806    LOS: 6 days   Time spent: 66 mins   Mathew Storck Wynetta Emery, MD How to contact the [facility] Attending or Consulting provider [place] or covering provider during after hours [place], for this patient?  Check the care team in [facility] and look for a) attending/consulting [facility] provider listed and b) the [facility] team listed Log into [email or web address] and use [place]'s universal password to access. If you do not have the password, please contact the hospital operator. Locate the [facility] provider you are looking for under [facility] and page to a number that you can be directly reached. If you still have difficulty reaching the provider, please page the [facility] (Director on Call) for the Hospitalists listed on amion for assistance.  09/03/2021, 4:02 PM

## 2021-09-03 NOTE — Progress Notes (Signed)
Patient's daughter called our office.  Ask why that he has had some many infections.  I discussed with her the fact that he is debilitated, has inadequate bladder emptying at times, and has also required a catheter at times.  Also stated that he may well become colonized with Pseudomonas which is the most recent pathogen to culture out.  I would recommend checking post void bladder volumes both while in the hospital as an inpatient and when he goes to skilled nursing facility.  Would recommend that he have a catheter placed if residual urines over 250 mL.  We will work on setting up an appointment for him to follow-up in our office within the next couple of weeks.

## 2021-09-04 MED ORDER — PANTOPRAZOLE SODIUM 40 MG PO TBEC
40.0000 mg | DELAYED_RELEASE_TABLET | Freq: Every day | ORAL | Status: DC
  Administered 2021-09-04 – 2021-09-06 (×3): 40 mg via ORAL
  Filled 2021-09-04 (×3): qty 1

## 2021-09-04 MED ORDER — VITAMIN D 25 MCG (1000 UNIT) PO TABS
2000.0000 [IU] | ORAL_TABLET | Freq: Every day | ORAL | Status: DC
  Administered 2021-09-04 – 2021-09-06 (×3): 2000 [IU] via ORAL
  Filled 2021-09-04 (×3): qty 2

## 2021-09-04 NOTE — Plan of Care (Signed)
  Problem: Education: Goal: Knowledge of General Education information will improve Description Including pain rating scale, medication(s)/side effects and non-pharmacologic comfort measures Outcome: Progressing   Problem: Health Behavior/Discharge Planning: Goal: Ability to manage health-related needs will improve Outcome: Progressing   

## 2021-09-04 NOTE — Progress Notes (Signed)
PROGRESS NOTE   Patrick Kerr  YBF:[id number] DOB: 24-Dec-1945 DOA: 08/28/2021 PCP: Ludwig Clarks, FNP   Chief Complaint  Patient presents with   Weakness   Level of care: Med-Surg  Brief Admission History:  76 y.o. male with medical history of dementia, BOO, BPH s/p TURP, chronic indwelling Foley catheter, hypothyroidism, hypertension, PTSD presenting with altered mental status.  The patient was recently hospitalized from 07/17/2021 to 07/29/2021 for septic shock due to Klebsiella UTI.  During that hospitalization the patient required intubation.  There was also a question of serotonin syndrome resulting in discontinuation of his Abilify and Zoloft.  The patient was discharged home with home health physical therapy.  The patient returned to the emergency department once again on 08/04/2021 with generalized weakness and irritability.  He was seen by the hospitalist at that time who felt that there was no new acute issue and this most likely is continued natural recovery from his recent prolonged hospitalization.  Once again, the patient was hospitalized at [facility] from 08/18/2021 to 08/21/2021 for agitation, confusion, and aggressive behavior.  It was felt that this was due to the patient's electrolyte derangement, UTI, and being off his psychiatric medications.  He was seen by psychiatry at that time who restarted the patient's Abilify at 7.5 mg daily.  Ativan was given 0.5 mg twice daily during the day and 1 mg at bedtime. The patient's daughter states that since returning home from [place] [facility], the patient has been doing fairly well up until the morning of 08/28/2021.  After breakfast, she noted that the patient was somewhat confused.  Caretaker stated that patient was speaking nonsensically and kept repeating the same words.  In addition, the patient was doing nonsensical things with his right arm.  Care taker stated that he was not particularly having any focal weakness, but  which is grabbing at things that were not there and also grabbing at objects for no reason.  There was no loss of consciousness.  She feel patient may have been incited by something his spouse said.  EMS was activated, and she stated that although he was a little bit more lucid he still remained somewhat confused in the emergency department.  In fact, at the time of my evaluation, the patient still have difficulty recognizing the patient's daughter, but he was able to speak clearly.  She states that he last had his Foley catheter changed on 08/23/2021.  There is been no reports of headache, chest pain, short of breath, cough, hemoptysis, nausea, vomiting without abdominal pain.   ED Patient was afebrile hemodynamically stable with oxygen saturation 97% room air.  BMP showed a sodium 135, potassium 4.2, chloride 104, bicarbonate 29, BUN 12, creatinine 0.9.  AST 19, ALT 14, alk phosphatase 50, total bilirubin 0.4.  WBC 5.2, hemoglobin 1.9, platelets 140,000.  CT of the brain was negative for any acute findings.  UA showed 21-50 WBC.  UDS was negative.   RR CALLED 08/31/21 -patient walked to bathroom and sat on commode.  NT went to check up on patient and was trying to get pt up when he became minimally responsive.  ("Eyes rolled back").  I arrived as staff was getting patient back to bed.  Patient awakens to tactile stimuli but not following commands or answering questions CBG 129, initial BP 60/41, 99% on 3L. About 10 min after syncope, patient following commands and answers questions.  Repeat vitals 5-10 min after event--HR 80, RR18--137/77, 99% on 3L  2/1: Pt had another near syncopal episode when getting up in chair.  His vitals quickly returned to baseline when lying back down in bed.  2/2 had some overnight sundowning (abilify and lorazepam had been held)  2/3 working on SNF placement  with TOC. (Resumed abilify and lorazepam), Remains medically stable for DC.  [facility]  to assess    Assessment & Plan:   Principal Problem:   Acute metabolic encephalopathy Active Problems:   Pseudomonas UTI   Neurocognitive disorder   Essential hypertension   Orthostatic syncope   PTSD (post-traumatic stress disorder)   Syncope and collapse   Thrombocytopenia (HCC)   RBBB   Tobacco use disorder   Assessment and Plan: * Acute metabolic encephalopathy- (present on admission) Secondary to Pseudomonas UTI  Improving with antibiotic treatments   Pseudomonas UTI- (present on admission) Patient is improving.  He was initially treated with IV cefepime and now transition to oral ciprofloxacin given culture results.  Neurocognitive disorder- (present on admission) Has been stable on home meds Long discussion with family members that he will need a outpatient neurology appointment for definitive work-up and diagnosis and treatment options and they verbalized understanding. Planning ambulatory referral to neurology at discharge.    Essential hypertension- (present on admission) Well-controlled on current treatments  Orthostatic syncope He has had a thorough work-up in the hospital he has been hydrated with IV fluids.  He had a 2D echocardiogram and MRI and CT scans and physical therapy evaluations.  He is doing better today.  Careful with ambulating.  He remains a high fall risk.  Working on SNF placement.  Syncope and collapse Continue fall precautions and working on SNF placement.  Thrombocytopenia ([place])- (present on admission) Stable no bleeding noted.   DVT prophylaxis: lovenox  Code Status: full  Family Communication: daughter/son in Sports coach updated 2/2, 2/3, caretaker bedside 2/4 Disposition: anticipating SNF placement - TOC [facility] [place] Status is: Inpatient Remains inpatient appropriate because: awaiting placement  Subjective: He denies any specific complaints today.   Objective: Vitals:   09/03/21 0510  09/03/21 1408 09/04/21 0350 09/04/21 1318  BP: 137/82 (!) 158/78 135/86 (!) 94/48  Pulse: 67 64 74 71  Resp:  18 20 16   Temp: (!) 97.4 F (36.3 C) 98.1 F (36.7 C) 97.8 F (36.6 C) 98.5 F (36.9 C)  TempSrc: Oral Axillary  Oral  SpO2: 95% 100% 95% 96%  Weight:      Height:        Intake/Output Summary (Last 24 hours) at 09/04/2021 1359 Last data filed at 09/04/2021 1300 Gross per 24 hour  Intake 420 ml  Output --  Net 420 ml   Filed Weights   08/28/21 1032 09/01/21 0555  Weight: 117.9 kg 108.1 kg   Examination: General:  Pt is awake, he answers questions, he is confused at times, follows commands, not in acute distress, he remains confused with dementia.  HEENT: No icterus, No thrush, No neck mass, [place]/AT Cardiovascular: normal S1/S2, no rubs, no gallops Respiratory: good air movement bilateral.  No wheeze Abdomen: Soft/+BS, non tender, non distended, no guarding Extremities: No edema, No lymphangitis, No petechiae, No rashes, no synovitis Neurological: nonfocal findings.    Data Reviewed: I have personally reviewed following labs and imaging studies  CBC: Recent Labs  Lab 08/29/21 0431 08/30/21 0428 08/31/21 0822 09/01/21 0704  WBC 4.7 5.0 5.0 3.8*  HGB 10.9* 11.6* 11.4* 10.6*  HCT 34.0* 36.7* 35.6* 33.6*  MCV 103.7* 100.8* 99.4 100.9*  PLT 128* 138* 156 128*    Basic Metabolic Panel: Recent Labs  Lab 08/29/21 0431 08/30/21 0428 08/31/21 0512 08/31/21 0822 09/01/21 0704  NA 138 141 139 138 140  K 3.7 3.8 3.6 3.4* 3.8  CL 107 109 107 108 109  CO2 25 27 24 23 25   GLUCOSE 92 101* 108* 144* 96  BUN 11 10 14 14 12   CREATININE 0.75 0.79 0.88 0.93 0.78  CALCIUM 8.7* 9.0 9.2 9.0 8.6*  MG  --  1.9 1.9  --  1.9    GFR: Estimated Creatinine Clearance: 105.9 mL/min (by C-G formula based on SCr of 0.78 mg/dL).  Liver Function Tests: Recent Labs  Lab 08/31/21 0822  AST 20  ALT 14  ALKPHOS 50  BILITOT 0.8  PROT 7.0  ALBUMIN 3.3*    CBG: Recent Labs   Lab 08/31/21 0750  GLUCAP 129*    Recent Results (from the past 240 hour(s))  Resp Panel by RT-PCR (Flu A&B, Covid) Nasopharyngeal Swab     Status: None   Collection Time: 08/28/21 11:17 AM   Specimen: Nasopharyngeal Swab; Nasopharyngeal(NP) swabs in vial transport medium  Result Value Ref Range Status   SARS Coronavirus 2 by RT PCR NEGATIVE NEGATIVE Final    Comment: (NOTE) SARS-CoV-2 target nucleic acids are NOT DETECTED.  The SARS-CoV-2 RNA is generally detectable in upper respiratory specimens during the acute phase of infection. The lowest concentration of SARS-CoV-2 viral copies this assay can detect is 138 copies/mL. A negative result does not preclude SARS-Cov-2 infection and should not be used as the sole basis for treatment or other patient management decisions. A negative result may occur with  improper specimen collection/handling, submission of specimen other than nasopharyngeal swab, presence of viral mutation(s) within the areas targeted by this assay, and inadequate number of viral copies(<138 copies/mL). A negative result must be combined with clinical observations, patient history, and epidemiological information. The expected result is Negative.  Fact Sheet for Patients:  EntrepreneurPulse.com.au  Fact Sheet for Healthcare Providers:  IncredibleEmployment.be  This test is no t yet approved or cleared by the [place] FDA and  has been authorized for detection and/or diagnosis of SARS-CoV-2 by FDA under an Emergency Use Authorization (EUA). This EUA will remain  in effect (meaning this test can be used) for the duration of the COVID-19 declaration under Section 564(b)(1) of the Act, 21 U.S.C.section 360bbb-3(b)(1), unless the authorization is terminated  or revoked sooner.       Influenza A by PCR NEGATIVE NEGATIVE Final   Influenza B by PCR NEGATIVE NEGATIVE Final    Comment: (NOTE) The Xpert Xpress  SARS-CoV-2/FLU/RSV plus assay is intended as an aid in the diagnosis of influenza from Nasopharyngeal swab specimens and should not be used as a sole basis for treatment. Nasal washings and aspirates are unacceptable for Xpert Xpress SARS-CoV-2/FLU/RSV testing.  Fact Sheet for Patients: EntrepreneurPulse.com.au  Fact Sheet for Healthcare Providers: IncredibleEmployment.be  This test is not yet approved or cleared by the [place] FDA and has been authorized for detection and/or diagnosis of SARS-CoV-2 by FDA under an Emergency Use Authorization (EUA). This EUA will remain in effect (meaning this test can be used) for the duration of the COVID-19 declaration under Section 564(b)(1) of the Act, 21 U.S.C. section 360bbb-3(b)(1), unless the authorization is terminated or revoked.  Performed at [facility], [address] [place], [place] [address]   Urine Culture  Status: Abnormal   Collection Time: 08/28/21 12:32 PM   Specimen: Urine, Catheterized  Result Value Ref Range Status   Specimen Description   Final    URINE, CATHETERIZED Performed at [facility], [address] [place], [place] [address]    Special Requests   Final    NONE Performed at [facility], [address], [place], [place] [address]    Culture >=100,000 COLONIES/mL PSEUDOMONAS AERUGINOSA (A)  Final   Report Status 08/31/2021 FINAL  Final   Organism ID, Bacteria PSEUDOMONAS AERUGINOSA (A)  Final      Susceptibility   Pseudomonas aeruginosa - MIC*    CEFTAZIDIME 4 SENSITIVE Sensitive     CIPROFLOXACIN 0.5 SENSITIVE Sensitive     GENTAMICIN <=1 SENSITIVE Sensitive     IMIPENEM 2 SENSITIVE Sensitive     PIP/TAZO 8 SENSITIVE Sensitive     CEFEPIME 2 SENSITIVE Sensitive     * >=100,000 COLONIES/mL PSEUDOMONAS AERUGINOSA  Culture, blood (routine x 2)     Status: None (Preliminary result)   Collection Time: 08/31/21 10:41 AM   Specimen: BLOOD  Result Value Ref Range  Status   Specimen Description BLOOD LEFT ANTECUBITAL  Final   Special Requests   Final    BOTTLES DRAWN AEROBIC AND ANAEROBIC Blood Culture adequate volume   Culture   Final    NO GROWTH 4 DAYS Performed at [facility], [address] [place], [place] [address]    Report Status PENDING  Incomplete  Culture, blood (routine x 2)     Status: None (Preliminary result)   Collection Time: 08/31/21 10:41 AM   Specimen: BLOOD  Result Value Ref Range Status   Specimen Description BLOOD LEFT ANTECUBITAL  Final   Special Requests   Final    BOTTLES DRAWN AEROBIC AND ANAEROBIC Blood Culture results may not be optimal due to an excessive volume of blood received in culture bottles   Culture   Final    NO GROWTH 4 DAYS Performed at [facility], [address], [place], [place] [address]    Report Status PENDING  Incomplete     Radiology Studies: No results found.  Scheduled Meds:  ARIPiprazole  2 mg Oral Daily   aspirin  325 mg Oral Daily   cholecalciferol  2,000 Units Oral Daily   ciprofloxacin  500 mg Oral BID   docusate sodium  100 mg Oral Daily   enoxaparin (LOVENOX) injection  40 mg Subcutaneous Q24H   feeding supplement  237 mL Oral TID BM   finasteride  5 mg Oral Daily   folic acid  1 mg Oral Daily   levothyroxine  137 mcg Oral QAC breakfast   LORazepam  0.25 mg Oral BID WC   LORazepam  1 mg Oral QHS   melatonin  6 mg Oral QHS   nystatin   Topical BID   pantoprazole  40 mg Oral Q0600   PARoxetine  10 mg Oral Daily   polyethylene glycol  17 g Oral Daily   tamsulosin  0.4 mg Oral QPC breakfast   Continuous Infusions:  sodium chloride 10 mL/hr at 09/02/21 1806    LOS: 7 days   Time spent: 35 mins   Hiilani Jetter Wynetta Emery, MD How to contact the [facility] Attending or Consulting provider [place] or covering provider during after hours [place], for this patient?  Check the care team in [facility] and look for a) attending/consulting [facility] provider listed and b) the [facility] team listed Log into  [email or web address] and use [place]'s universal password to access. If you do not have the password, please contact the hospital operator. Locate the [facility] provider you are looking for under [facility] and page to a number that you can be directly reached. If you still have difficulty reaching the provider, please page the [facility] (Director on Call) for the Hospitalists listed on amion for assistance.  09/04/2021, 1:59 PM

## 2021-09-04 NOTE — Progress Notes (Signed)
Patient's caregiver requested that I wait until patient is awake this morning to give his 0600 meds. I will reschedule 0600 meds for 0800.

## 2021-09-05 DIAGNOSIS — R338 Other retention of urine: Secondary | ICD-10-CM | POA: Diagnosis present

## 2021-09-05 LAB — CULTURE, BLOOD (ROUTINE X 2)
Culture: NO GROWTH
Culture: NO GROWTH
Special Requests: ADEQUATE

## 2021-09-05 MED ORDER — CHLORHEXIDINE GLUCONATE CLOTH 2 % EX PADS
6.0000 | MEDICATED_PAD | Freq: Every day | CUTANEOUS | Status: DC
  Administered 2021-09-05 – 2021-09-06 (×2): 6 via TOPICAL

## 2021-09-05 MED ORDER — LORAZEPAM 0.5 MG PO TABS
0.5000 mg | ORAL_TABLET | Freq: Once | ORAL | Status: AC
  Administered 2021-09-05: 0.5 mg via ORAL
  Filled 2021-09-05: qty 1

## 2021-09-05 MED ORDER — ARIPIPRAZOLE 2 MG PO TABS
2.0000 mg | ORAL_TABLET | Freq: Every day | ORAL | Status: DC
  Administered 2021-09-05: 2 mg via ORAL
  Filled 2021-09-05 (×3): qty 1

## 2021-09-05 MED ORDER — BISACODYL 10 MG RE SUPP
10.0000 mg | Freq: Every day | RECTAL | Status: DC | PRN
  Administered 2021-09-05: 10 mg via RECTAL
  Filled 2021-09-05: qty 1

## 2021-09-05 NOTE — Plan of Care (Signed)
  Problem: Education: Goal: Knowledge of General Education information will improve Description: Including pain rating scale, medication(s)/side effects and non-pharmacologic comfort measures Outcome: Progressing   Problem: Clinical Measurements: Goal: Diagnostic test results will improve Outcome: Progressing Goal: Cardiovascular complication will be avoided Outcome: Progressing   Problem: Activity: Goal: Risk for activity intolerance will decrease Outcome: Progressing   

## 2021-09-05 NOTE — Assessment & Plan Note (Addendum)
This has been a longstanding problem managed with lorazepam which we have continued although we have reduced the doses to try to avoid oversedation of the patient.  He is receiving 0.25 mg morning, lunch, and 1 mg in the evening.

## 2021-09-05 NOTE — Assessment & Plan Note (Addendum)
-   Coudet catheter placed 09/05/21.  - will leave catheter in place until he can follow up with his urologist Dr. Retta Diones.  -Pt has appt with urology office on 09/14/21.

## 2021-09-05 NOTE — Progress Notes (Signed)
Patient's bladder scanned and found to have > 800 noted in his bladder on scan.  Provider(Dr Z) paged and informed of retention. Order given to intermittent cath. On x2 attempts to cath resistance experienced. RN called and discussed this with patients daughter who admits patient had required a coude catheter in the past for acute retention. AC called and asked to intermittent cath with coude tip/to which she was agreeable. Patient found to have 700 clear/amber colored urine out.

## 2021-09-05 NOTE — Assessment & Plan Note (Addendum)
He now has a coudet catheter in place and will need outpatient follow up with Dr. Diona Fanti (urology).  HE HAS UROLOGY APPT ON 09/14/21.

## 2021-09-05 NOTE — Progress Notes (Signed)
PROGRESS NOTE   Patrick Kerr  BSJ:[id number] DOB: Jul 21, 1946 DOA: 08/28/2021 PCP: Ludwig Clarks, FNP   Chief Complaint  Patient presents with   Weakness   Level of care: Med-Surg  Brief Admission History:  76 y.o. male with medical history of dementia, BOO, BPH s/p TURP, chronic indwelling Foley catheter, hypothyroidism, hypertension, PTSD presenting with altered mental status.  The patient was recently hospitalized from 07/17/2021 to 07/29/2021 for septic shock due to Klebsiella UTI.  During that hospitalization the patient required intubation.  There was also a question of serotonin syndrome resulting in discontinuation of his Abilify and Zoloft.  The patient was discharged home with home health physical therapy.  The patient returned to the emergency department once again on 08/04/2021 with generalized weakness and irritability.  He was seen by the hospitalist at that time who felt that there was no new acute issue and this most likely is continued natural recovery from his recent prolonged hospitalization.  Once again, the patient was hospitalized at [facility] from 08/18/2021 to 08/21/2021 for agitation, confusion, and aggressive behavior.  It was felt that this was due to the patient's electrolyte derangement, UTI, and being off his psychiatric medications.  He was seen by psychiatry at that time who restarted the patient's Abilify at 7.5 mg daily.  Ativan was given 0.5 mg twice daily during the day and 1 mg at bedtime. The patient's daughter states that since returning home from [facility], the patient has been doing fairly well up until the morning of 08/28/2021.  After breakfast, she noted that the patient was somewhat confused.  Caretaker stated that patient was speaking nonsensically and kept repeating the same words.  In addition, the patient was doing nonsensical things with his right arm.  Care taker stated that he was not particularly having any focal weakness, but  which is grabbing at things that were not there and also grabbing at objects for no reason.  There was no loss of consciousness.  She feel patient may have been incited by something his spouse said.  EMS was activated, and she stated that although he was a little bit more lucid he still remained somewhat confused in the emergency department.  In fact, at the time of my evaluation, the patient still have difficulty recognizing the patient's daughter, but he was able to speak clearly.  She states that he last had his Foley catheter changed on 08/23/2021.  There is been no reports of headache, chest pain, short of breath, cough, hemoptysis, nausea, vomiting without abdominal pain.   ED Patient was afebrile hemodynamically stable with oxygen saturation 97% room air.  BMP showed a sodium 135, potassium 4.2, chloride 104, bicarbonate 29, BUN 12, creatinine 0.9.  AST 19, ALT 14, alk phosphatase 50, total bilirubin 0.4.  WBC 5.2, hemoglobin 1.9, platelets 140,000.  CT of the brain was negative for any acute findings.  UA showed 21-50 WBC.  UDS was negative.   RR CALLED 08/31/21 -patient walked to bathroom and sat on commode.  NT went to check up on patient and was trying to get pt up when he became minimally responsive.  ("Eyes rolled back").  I arrived as staff was getting patient back to bed.  Patient awakens to tactile stimuli but not following commands or answering questions CBG 129, initial BP 60/41, 99% on 3L. About 10 min after syncope, patient following commands and answers questions.  Repeat vitals 5-10 min after event--HR 80, RR18--137/77, 99% on 3L  2/1: Pt had another near syncopal episode when getting up in chair.  His vitals quickly returned to baseline when lying back down in bed.  2/2 had some overnight sundowning (abilify and lorazepam had been held)  2/3 working on SNF placement  with TOC. (Resumed abilify and lorazepam), Remains medically stable for DC.  [facility]5  to assess  2/5: Coudet catheter placed for urinary retention, will plan to keep catheter in place until he can follow up with Dr. Diona Fanti his urologist.      Assessment & Plan:   Principal Problem:   Acute metabolic encephalopathy Active Problems:   Acute urinary retention   Pseudomonas UTI   Neurocognitive disorder   Essential hypertension   Orthostatic syncope   PTSD (post-traumatic stress disorder)   Syncope and collapse   Thrombocytopenia (HCC)   BPH with urinary obstruction   Assessment and Plan: * Acute metabolic encephalopathy- (present on admission) RESOLVED NOW. Secondary to Pseudomonas UTI  Improved with antibiotic treatments   Acute urinary retention- (present on admission) - Coudet catheter placed 09/05/21.  - will leave catheter in place until he can follow up with his urologist Dr. Diona Fanti.   Pseudomonas UTI- (present on admission) Patient is improving.  He was initially treated with IV cefepime and now transitioned to oral ciprofloxacin given culture results.  Neurocognitive disorder- (present on admission) Has been stable on home meds Long discussion with family members that he will need a outpatient neurology appointment for definitive work-up and diagnosis and treatment options and they verbalized understanding. Planning ambulatory referral to neurology at discharge.    Essential hypertension- (present on admission) Well-controlled on current treatments  Orthostatic syncope He has had a thorough work-up in the hospital he has been hydrated with IV fluids.  He had a 2D echocardiogram and MRI and CT scans and physical therapy evaluations.  He is doing better today.  Careful with ambulating.  He remains a high fall risk.  Working on SNF placement.  PTSD (post-traumatic stress disorder)- (present on admission) This has been a longstanding problem managed with lorazepam which we have continued although we have reduced the doses to try to avoid oversedation  of the patient.   Syncope and collapse Continue fall precautions and working on SNF placement.  BPH with urinary obstruction- (present on admission) He now has a coudet catheter in place and will need outpatient follow up with Dr. Diona Fanti (urology)  Thrombocytopenia [facility])- (present on admission) Stable no bleeding noted.   DVT prophylaxis: lovenox  Code Status: full  Family Communication: daughter/son in law updated 2/2, 2/3, caretaker bedside 2/4, 2/5, tried to call daughter 2/5 but no answer Disposition: anticipating SNF placement - TOC [facility] [place] Status is: Inpatient Remains inpatient appropriate because: awaiting placement  Subjective: He wanted to get on the toilet to have a bowel movement.     Objective: Vitals:   09/04/21 1318 09/04/21 2100 09/05/21 0428 09/05/21 1431  BP: (!) 94/48 113/77 (!) 89/54 (!) 106/56  Pulse: 71 89 79 95  Resp: 16 17 17 16   Temp: 98.5 F (36.9 C) 98.2 F (36.8 C) (!) 97.3 F (36.3 C) 98.1 F (36.7 C)  TempSrc: Oral Oral Oral   SpO2: 96% 94% 94% 94%  Weight:      Height:        Intake/Output Summary (Last 24 hours) at 09/05/2021 1559 Last data filed at 09/05/2021 0846 Gross per 24 hour  Intake 600 ml  Output 2 ml  Net 598 ml   Filed Weights   08/28/21 1032 09/01/21 0555  Weight: 117.9 kg 108.1 kg   Examination: General:  Pt is awake, he answers questions, he is confused at times, follows commands, not in acute distress, he remains confused with dementia.  HEENT: No icterus, No thrush, No neck mass, [place]/AT Cardiovascular: normal S1/S2, no rubs, no gallops Respiratory: good air movement bilateral.  No wheeze Abdomen: Soft/+BS, non tender, non distended, no guarding Extremities: No edema, No lymphangitis, No petechiae, No rashes, no synovitis Neurological: nonfocal findings.    Data Reviewed: I have personally reviewed following labs and imaging studies  CBC: Recent Labs  Lab  08/30/21 0428 08/31/21 0822 09/01/21 0704  WBC 5.0 5.0 3.8*  HGB 11.6* 11.4* 10.6*  HCT 36.7* 35.6* 33.6*  MCV 100.8* 99.4 100.9*  PLT 138* 156 128*    Basic Metabolic Panel: Recent Labs  Lab 08/30/21 0428 08/31/21 0512 08/31/21 0822 09/01/21 0704  NA 141 139 138 140  K 3.8 3.6 3.4* 3.8  CL 109 107 108 109  CO2 27 24 23 25   GLUCOSE 101* 108* 144* 96  BUN 10 14 14 12   CREATININE 0.79 0.88 0.93 0.78  CALCIUM 9.0 9.2 9.0 8.6*  MG 1.9 1.9  --  1.9    GFR: Estimated Creatinine Clearance: 105.9 mL/min (by C-G formula based on SCr of 0.78 mg/dL).  Liver Function Tests: Recent Labs  Lab 08/31/21 0822  AST 20  ALT 14  ALKPHOS 50  BILITOT 0.8  PROT 7.0  ALBUMIN 3.3*    CBG: Recent Labs  Lab 08/31/21 0750  GLUCAP 129*    Recent Results (from the past 240 hour(s))  Resp Panel by RT-PCR (Flu A&B, Covid) Nasopharyngeal Swab     Status: None   Collection Time: 08/28/21 11:17 AM   Specimen: Nasopharyngeal Swab; Nasopharyngeal(NP) swabs in vial transport medium  Result Value Ref Range Status   SARS Coronavirus 2 by RT PCR NEGATIVE NEGATIVE Final    Comment: (NOTE) SARS-CoV-2 target nucleic acids are NOT DETECTED.  The SARS-CoV-2 RNA is generally detectable in upper respiratory specimens during the acute phase of infection. The lowest concentration of SARS-CoV-2 viral copies this assay can detect is 138 copies/mL. A negative result does not preclude SARS-Cov-2 infection and should not be used as the sole basis for treatment or other patient management decisions. A negative result may occur with  improper specimen collection/handling, submission of specimen other than nasopharyngeal swab, presence of viral mutation(s) within the areas targeted by this assay, and inadequate number of viral copies(<138 copies/mL). A negative result must be combined with clinical observations, patient history, and epidemiological information. The expected result is Negative.  Fact  Sheet for Patients:  EntrepreneurPulse.com.au  Fact Sheet for Healthcare Providers:  IncredibleEmployment.be  This test is no t yet approved or cleared by the [place] FDA and  has been authorized for detection and/or diagnosis of SARS-CoV-2 by FDA under an Emergency Use Authorization (EUA). This EUA will remain  in effect (meaning this test can be used) for the duration of the COVID-19 declaration under Section 564(b)(1) of the Act, 21 U.S.C.section 360bbb-3(b)(1), unless the authorization is terminated  or revoked sooner.       Influenza A by PCR NEGATIVE NEGATIVE Final   Influenza B by PCR NEGATIVE NEGATIVE Final    Comment: (NOTE) The Xpert Xpress SARS-CoV-2/FLU/RSV plus assay is intended as an aid in the diagnosis of influenza from  Nasopharyngeal swab specimens and should not be used as a sole basis for treatment. Nasal washings and aspirates are unacceptable for Xpert Xpress SARS-CoV-2/FLU/RSV testing.  Fact Sheet for Patients: EntrepreneurPulse.com.au  Fact Sheet for Healthcare Providers: IncredibleEmployment.be  This test is not yet approved or cleared by the [place] FDA and has been authorized for detection and/or diagnosis of SARS-CoV-2 by FDA under an Emergency Use Authorization (EUA). This EUA will remain in effect (meaning this test can be used) for the duration of the COVID-19 declaration under Section 564(b)(1) of the Act, 21 U.S.C. section 360bbb-3(b)(1), unless the authorization is terminated or revoked.  Performed at [facility], [address], [place], [place] [address]   Urine Culture     Status: Abnormal   Collection Time: 08/28/21 12:32 PM   Specimen: Urine, Catheterized  Result Value Ref Range Status   Specimen Description   Final    URINE, CATHETERIZED Performed at [facility], [address] [place], [place] [address]    Special Requests   Final     NONE Performed at [facility], [address], [place], [place] [address]    Culture >=100,000 COLONIES/mL PSEUDOMONAS AERUGINOSA (A)  Final   Report Status 08/31/2021 FINAL  Final   Organism ID, Bacteria PSEUDOMONAS AERUGINOSA (A)  Final      Susceptibility   Pseudomonas aeruginosa - MIC*    CEFTAZIDIME 4 SENSITIVE Sensitive     CIPROFLOXACIN 0.5 SENSITIVE Sensitive     GENTAMICIN <=1 SENSITIVE Sensitive     IMIPENEM 2 SENSITIVE Sensitive     PIP/TAZO 8 SENSITIVE Sensitive     CEFEPIME 2 SENSITIVE Sensitive     * >=100,000 COLONIES/mL PSEUDOMONAS AERUGINOSA  Culture, blood (routine x 2)     Status: None   Collection Time: 08/31/21 10:41 AM   Specimen: BLOOD  Result Value Ref Range Status   Specimen Description BLOOD LEFT ANTECUBITAL  Final   Special Requests   Final    BOTTLES DRAWN AEROBIC AND ANAEROBIC Blood Culture adequate volume   Culture   Final    NO GROWTH 5 DAYS Performed at [facility], [address], [place], [place] [address]    Report Status 09/05/2021 FINAL  Final  Culture, blood (routine x 2)     Status: None   Collection Time: 08/31/21 10:41 AM   Specimen: BLOOD  Result Value Ref Range Status   Specimen Description BLOOD LEFT ANTECUBITAL  Final   Special Requests   Final    BOTTLES DRAWN AEROBIC AND ANAEROBIC Blood Culture results may not be optimal due to an excessive volume of blood received in culture bottles   Culture   Final    NO GROWTH 5 DAYS Performed at [facility], [address] [place], [place] [address]    Report Status 09/05/2021 FINAL  Final     Radiology Studies: No results found.  Scheduled Meds:  ARIPiprazole  2 mg Oral Daily   aspirin  325 mg Oral Daily   Chlorhexidine Gluconate Cloth  6 each Topical Daily   cholecalciferol  2,000 Units Oral Daily   docusate sodium  100 mg Oral Daily   enoxaparin (LOVENOX) injection  40 mg Subcutaneous Q24H   feeding supplement  237 mL Oral TID BM   finasteride  5 mg Oral Daily   folic acid  1  mg Oral Daily   levothyroxine  137 mcg Oral QAC breakfast   LORazepam  0.25 mg Oral BID WC   LORazepam  1 mg  Oral QHS   melatonin  6 mg Oral QHS   nystatin   Topical BID   pantoprazole  40 mg Oral Q0600   PARoxetine  10 mg Oral Daily   polyethylene glycol  17 g Oral Daily   tamsulosin  0.4 mg Oral QPC breakfast   Continuous Infusions:  sodium chloride 10 mL/hr at 09/02/21 1806    LOS: 8 days   Time spent: 41 mins   Chareese Sergent Wynetta Emery, MD How to contact the [facility] Attending or Consulting provider [place] or covering provider during after hours [place], for this patient?  Check the care team in [facility] and look for a) attending/consulting TRH provider listed and b) the [facility] team listed Log into [email or web address] and use [place]'s universal password to access. If you do not have the password, please contact the hospital operator. Locate the [facility] provider you are looking for under [facility] and page to a number that you can be directly reached. If you still have difficulty reaching the provider, please page the [facility] (Director on Call) for the Hospitalists listed on amion for assistance.  09/05/2021, 3:59 PM

## 2021-09-06 DIAGNOSIS — R338 Other retention of urine: Secondary | ICD-10-CM

## 2021-09-06 DIAGNOSIS — N401 Enlarged prostate with lower urinary tract symptoms: Secondary | ICD-10-CM

## 2021-09-06 DIAGNOSIS — R419 Unspecified symptoms and signs involving cognitive functions and awareness: Secondary | ICD-10-CM

## 2021-09-06 DIAGNOSIS — N390 Urinary tract infection, site not specified: Secondary | ICD-10-CM

## 2021-09-06 DIAGNOSIS — N138 Other obstructive and reflux uropathy: Secondary | ICD-10-CM

## 2021-09-06 MED ORDER — ARIPIPRAZOLE 2 MG PO TABS: 2.0000 mg | ORAL_TABLET | Freq: Every day | ORAL | Status: DC

## 2021-09-06 MED ORDER — ACETAMINOPHEN 325 MG PO CAPS: 2.0000 | ORAL_CAPSULE | ORAL | Status: DC | PRN

## 2021-09-06 MED ORDER — CYANOCOBALAMIN 1000 MCG/ML IJ SOLN: 1000.0000 ug | INTRAMUSCULAR | 0 refills | Status: DC

## 2021-09-06 MED ORDER — MELATONIN 3 MG PO TABS: 6.0000 mg | ORAL_TABLET | Freq: Every day | ORAL | 0 refills | Status: DC

## 2021-09-06 MED ORDER — FOLIC ACID 1 MG PO TABS: 1.0000 mg | ORAL_TABLET | Freq: Every day | ORAL | Status: DC

## 2021-09-06 MED ORDER — NYSTATIN 100000 UNIT/GM EX POWD: Freq: Two times a day (BID) | CUTANEOUS | 0 refills | Status: DC

## 2021-09-06 MED ORDER — LORAZEPAM 0.5 MG PO TABS: ORAL_TABLET | ORAL | 0 refills | Status: DC

## 2021-09-06 NOTE — Discharge Instructions (Signed)
IMPORTANT INFORMATION: PAY CLOSE ATTENTION  ? ?PHYSICIAN DISCHARGE INSTRUCTIONS ? ?Follow with Primary care provider  Adams, Erica W, FNP  and other consultants as instructed by your Hospitalist Physician ? ?SEEK MEDICAL CARE OR RETURN TO EMERGENCY ROOM IF SYMPTOMS COME BACK, WORSEN OR NEW PROBLEM DEVELOPS  ? ?Please note: ?You were cared for by a hospitalist during your hospital stay. Every effort will be made to forward records to your primary care provider.  You can request that your primary care provider send for your hospital records if they have not received them.  Once you are discharged, your primary care physician will handle any further medical issues. Please note that NO REFILLS for any discharge medications will be authorized once you are discharged, as it is imperative that you return to your primary care physician (or establish a relationship with a primary care physician if you do not have one) for your post hospital discharge needs so that they can reassess your need for medications and monitor your lab values. ? ?Please get a complete blood count and chemistry panel checked by your Primary MD at your next visit, and again as instructed by your Primary MD. ? ?Get Medicines reviewed and adjusted: ?Please take all your medications with you for your next visit with your Primary MD ? ?Laboratory/radiological data: ?Please request your Primary MD to go over all hospital tests and procedure/radiological results at the follow up, please ask your primary care provider to get all Hospital records sent to his/her office. ? ?In some cases, they will be blood work, cultures and biopsy results pending at the time of your discharge. Please request that your primary care provider follow up on these results. ? ?If you are diabetic, please bring your blood sugar readings with you to your follow up appointment with primary care.   ? ?Please call and make your follow up appointments as soon as possible.   ? ?Also Note  the following: ?If you experience worsening of your admission symptoms, develop shortness of breath, life threatening emergency, suicidal or homicidal thoughts you must seek medical attention immediately by calling 911 or calling your MD immediately  if symptoms less severe. ? ?You must read complete instructions/literature along with all the possible adverse reactions/side effects for all the Medicines you take and that have been prescribed to you. Take any new Medicines after you have completely understood and accpet all the possible adverse reactions/side effects.  ? ?Do not drive when taking Pain medications or sleeping medications (Benzodiazepines) ? ?Do not take more than prescribed Pain, Sleep and Anxiety Medications. It is not advisable to combine anxiety,sleep and pain medications without talking with your primary care practitioner ? ?Special Instructions: If you have smoked or chewed Tobacco  in the last 2 yrs please stop smoking, stop any regular Alcohol  and or any Recreational drug use. ? ?Wear Seat belts while driving.  Do not drive if taking any narcotic, mind altering or controlled substances or recreational drugs or alcohol.  ? ? ? ? ? ?

## 2021-09-06 NOTE — TOC Transition Note (Signed)
Transition of Care [facility]) - CM/SW Discharge Note   Patient Details  Name: Patrick Kerr MRN: [id number] Date of Birth: 08/04/1945  [facility] CM/SW Contact:  Elliot Gault, LCSW Phone Number: 09/06/2021, 12:15 PM   Clinical Narrative:     Pt stable for dc to SNF today per MD. Spoke with pt's daughter who confirms she would like pt to transfer to [facility] for short term rehab. Spoke with Kyung Rudd at [facility] and they can take pt today. RN to call report. DC clinical sent electronically. EMS to transport.  There are no other TOC needs for dc.  Final next level of care: Skilled Nursing Facility Barriers to Discharge: Barriers Resolved   Patient Goals and CMS Choice Patient states their goals for this hospitalization and ongoing recovery are:: get better CMS Medicare.gov Compare Post Acute Care list provided to:: Patient Represenative (must comment) Choice offered to / list presented to : Adult Children  Discharge Placement              Patient chooses bed at: [facility] Patient to be transferred to facility by: EMS Name of family member notified: Alesa Patient and family notified of of transfer: 09/06/21  Discharge Plan and Services                                     Social Determinants of Health (SDOH) Interventions     Readmission Risk Interventions No flowsheet data found.

## 2021-09-06 NOTE — Telephone Encounter (Signed)
Scheduled to see MD on 02/14.  Daughter states patient will leave facility with foley catheter due to not being able to void on own.

## 2021-09-06 NOTE — Discharge Summary (Addendum)
Physician Discharge Summary   Patient: Patrick Kerr MRN: [id number] DOB: 1946-03-17  Admit date:     08/28/2021  Discharge date: 09/06/21  Discharge Physician: Irwin Brakeman   PCP: Ludwig Clarks, FNP   Recommendations at discharge:    Follow up with urologist on 09/14/21 as scheduled  Continue coudet catheter and catheter care per protocol Outpatient palliative care consultation recommended Outpatient follow up with neurology recommended in 2-3 weeks   Discharge Diagnoses: Principal Problem:   Acute metabolic encephalopathy Active Problems:   Acute urinary retention   Pseudomonas UTI   Neurocognitive disorder   Essential hypertension   Orthostatic syncope   PTSD (post-traumatic stress disorder)   Syncope and collapse   Thrombocytopenia (HCC)   BPH with urinary obstruction  Resolved Problems:   * No resolved hospital problems. *  Hospital Course: 76 y.o. male with medical history of dementia, BOO, BPH s/p TURP, chronic indwelling Foley catheter, hypothyroidism, hypertension, PTSD presenting with altered mental status.  The patient was recently hospitalized from 07/17/2021 to 07/29/2021 for septic shock due to Klebsiella UTI.  During that hospitalization the patient required intubation.  There was also a question of serotonin syndrome resulting in discontinuation of his Abilify and Zoloft.  The patient was discharged home with home health physical therapy.  The patient returned to the emergency department once again on 08/04/2021 with generalized weakness and irritability.  He was seen by the hospitalist at that time who felt that there was no new acute issue and this most likely is continued natural recovery from his recent prolonged hospitalization.  Once again, the patient was hospitalized at [facility] from 08/18/2021 to 08/21/2021 for agitation, confusion, and aggressive behavior.  It was felt that this was due to the patient's electrolyte derangement, UTI, and  being off his psychiatric medications.  He was seen by psychiatry at that time who restarted the patient's Abilify at 7.5 mg daily.  Ativan was given 0.5 mg twice daily during the day and 1 mg at bedtime. The patient's daughter states that since returning home from [place] [facility], the patient has been doing fairly well up until the morning of 08/28/2021.  After breakfast, she noted that the patient was somewhat confused.  Caretaker stated that patient was speaking nonsensically and kept repeating the same words.  In addition, the patient was doing nonsensical things with his right arm.  Care taker stated that he was not particularly having any focal weakness, but which is grabbing at things that were not there and also grabbing at objects for no reason.  There was no loss of consciousness.  She feel patient may have been incited by something his spouse said.  EMS was activated, and she stated that although he was a little bit more lucid he still remained somewhat confused in the emergency department.  In fact, at the time of my evaluation, the patient still have difficulty recognizing the patient's daughter, but he was able to speak clearly.  She states that he last had his Foley catheter changed on 08/23/2021.  There is been no reports of headache, chest pain, short of breath, cough, hemoptysis, nausea, vomiting without abdominal pain.   ED Patient was afebrile hemodynamically stable with oxygen saturation 97% room air.  BMP showed a sodium 135, potassium 4.2, chloride 104, bicarbonate 29, BUN 12, creatinine 0.9.  AST 19, ALT 14, alk phosphatase 50, total bilirubin 0.4.  WBC 5.2, hemoglobin 1.9, platelets 140,000.  CT of the brain was negative for any  acute findings.  UA showed 21-50 WBC.  UDS was negative.   RR CALLED 08/31/21 -patient walked to bathroom and sat on commode.  NT went to check up on patient and was trying to get pt up when he became minimally responsive.  ("Eyes rolled back").  I arrived as  staff was getting patient back to bed.  Patient awakens to tactile stimuli but not following commands or answering questions CBG 129, initial BP 60/41, 99% on 3L. About 10 min after syncope, patient following commands and answers questions.  Repeat vitals 5-10 min after event--HR 80, RR18--137/77, 99% on 3L   2/1: Pt had another near syncopal episode when getting up in chair.  His vitals quickly returned to baseline when lying back down in bed.  2/2 had some overnight sundowning (abilify and lorazepam had been held)  2/3 working on SNF placement  with TOC. (Resumed abilify and lorazepam), Remains medically stable for DC.  [facility] planning to come see patient on 2/5 to assess  2/5: Coudet catheter placed for urinary retention, will plan to keep catheter in place until he can follow up with Dr. Diona Fanti his urologist.   Pt has appointment with urology on 09/14/21.   2/6: Social worker confirms patient accepted at [facility]  and can discharge today.       Assessment and Plan: * Acute metabolic encephalopathy- (present on admission) RESOLVED NOW. Secondary to Pseudomonas UTI  Improved with antibiotic treatments   Acute urinary retention- (present on admission) - Coudet catheter placed 09/05/21.  - will leave catheter in place until he can follow up with his urologist Dr. Diona Fanti.  -Pt has appt with urology office on 09/14/21.    Pseudomonas UTI- (present on admission) Patient is improving.  He was initially treated with IV cefepime and now transitioned to oral ciprofloxacin given culture results and completed oral ciprofloxacin.  Neurocognitive disorder- (present on admission) Has been stable on home meds Long discussion with family members that he will need a outpatient neurology appointment for definitive work-up and diagnosis and treatment options and they verbalized understanding. Planning ambulatory referral to neurology at discharge.    Essential hypertension- (present on  admission) Well-controlled in hospital.   Orthostatic syncope He has had a thorough work-up in the hospital he has been hydrated with IV fluids.  He had a 2D echocardiogram and MRI and CT scans and physical therapy evaluations.  He is doing better today.  Careful with ambulating.  He remains a high fall risk.  Working on SNF placement.  PTSD (post-traumatic stress disorder)- (present on admission) This has been a longstanding problem managed with lorazepam which we have continued although we have reduced the doses to try to avoid oversedation of the patient.  He is receiving 0.25 mg morning, lunch, and 1 mg in the evening.   Syncope and collapse Continue fall precautions and SNF placement.  BPH with urinary obstruction- (present on admission) He now has a coudet catheter in place and will need outpatient follow up with Dr. Diona Fanti (urology).  HE HAS UROLOGY APPT ON 09/14/21.   Thrombocytopenia ([place])- (present on admission) Stable no bleeding noted.         Consultants:  Procedures performed:   Disposition: Skilled nursing facility Diet recommendation:  Dysphagia type 3 thin Liquid  DISCHARGE MEDICATION: Allergies as of 09/06/2021       Reactions   Sulfamethoxazole-trimethoprim Other (See Comments), Rash   Amitriptyline Other (See Comments)   Elavil makes him "hyper".   Azithromycin Other (  See Comments)   Causes ulcers   Erythromycin Other (See Comments)   Pt states gave him an ulcer   Erythromycin Base    Mirabegron Other (See Comments)   Headache   Prednisone Itching, Other (See Comments)   Numbness, pt states feet felt like they were on fire   Hydromorphone Itching        Medication List     STOP taking these medications    hydrocortisone cream 1 %   lisinopril 20 MG tablet Commonly known as: ZESTRIL   OVER THE COUNTER MEDICATION   potassium chloride 10 MEQ tablet Commonly known as: KLOR-CON   silodosin 8 MG Caps capsule Commonly known as: RAPAFLO        TAKE these medications    Acetaminophen 325 MG Caps Take 2 capsules by mouth every 4 (four) hours as needed. What changed:  when to take this reasons to take this   albuterol (2.5 MG/3ML) 0.083% nebulizer solution Commonly known as: PROVENTIL Take 3 mLs (2.5 mg total) by nebulization every 4 (four) hours as needed for wheezing or shortness of breath.   ARIPiprazole 2 MG tablet Commonly known as: ABILIFY Take 1 tablet (2 mg total) by mouth daily. What changed:  medication strength how much to take   cyanocobalamin 1000 MCG/ML injection Commonly known as: (VITAMIN B-12) Inject 1 mL (1,000 mcg total) into the muscle every 30 (thirty) days. What changed:  how much to take when to take this   DSS 100 MG Caps Take 1 capsule by mouth daily.   Ergocalciferol 50 MCG (2000 UT) Tabs Take 1 tablet by mouth daily.   feeding supplement Liqd Take 237 mLs by mouth 3 (three) times daily between meals.   finasteride 5 MG tablet Commonly known as: PROSCAR Take 1 tablet (5 mg total) by mouth daily.   folic acid 1 MG tablet Commonly known as: FOLVITE Take 1 tablet (1 mg total) by mouth daily. Start taking on: September 07, 2021   levothyroxine 137 MCG tablet Commonly known as: SYNTHROID Take 137 mcg by mouth daily before breakfast.   LORazepam 0.5 MG tablet Commonly known as: ATIVAN Take 1/2 in the morning and 1/2 at noon and 1 mg around 7pm What changed:  medication strength how much to take how to take this when to take this additional instructions   melatonin 3 MG Tabs tablet Take 2 tablets (6 mg total) by mouth at bedtime.   nystatin powder Commonly known as: MYCOSTATIN/NYSTOP Apply topically 2 (two) times daily.   PARoxetine 10 MG tablet Commonly known as: PAXIL Take 10 mg by mouth daily.   polyethylene glycol 17 g packet Commonly known as: MIRALAX / GLYCOLAX Take 17 g by mouth daily.   tamsulosin 0.4 MG Caps capsule Commonly known as: FLOMAX Take 0.4  mg by mouth daily after breakfast.        Contact information for follow-up providers     Franchot Gallo, MD. Schedule an appointment as soon as possible for a visit in 1 week(s).   Specialty: Urology Why: Hospital Follow Up Contact information: [place] [address] [phone]              Contact information for after-discharge care     [facility] .   Service: Skilled Chiropodist information: [address] [place] [phone]  Discharge Exam: Filed Weights   08/28/21 1032 09/01/21 0555  Weight: 117.9 kg 108.1 kg   General:  Pt is awake, he answers questions, he is confused at times, follows commands, not in acute distress, he remains confused with dementia.  HEENT: No icterus, No thrush, No neck mass, [place]/AT Cardiovascular: normal S1/S2, no rubs, no gallops Respiratory: good air movement bilateral.  No wheeze Abdomen: Soft/+BS, non tender, non distended, no guarding Extremities: No edema, No lymphangitis, No petechiae, No rashes, no synovitis Neurological: nonfocal findings.    Condition at discharge: stable  The results of significant diagnostics from this hospitalization (including imaging, microbiology, ancillary and laboratory) are listed below for reference.   Imaging Studies: CT HEAD WO CONTRAST (5MM)  Result Date: 09/01/2021 CLINICAL DATA:  Neuro deficit, acute, stroke suspected motion EXAM: CT HEAD WITHOUT CONTRAST TECHNIQUE: Contiguous axial images were obtained from the base of the skull through the vertex without intravenous contrast. RADIATION DOSE REDUCTION: This exam was performed according to the departmental dose-optimization program which includes automated exposure control, adjustment of the mA and/or kV according to patient size and/or use of iterative reconstruction technique. COMPARISON:  CT head 08/30/2021. FINDINGS: Brain: No  evidence of acute infarction, hemorrhage, hydrocephalus, extra-axial collection or mass lesion/mass effect. Patchy and confluent white matter hypoattenuation, nonspecific compatible chronic microvascular disease. Vascular: No hyperdense vessel identified. Calcific intracranial atherosclerosis. Skull: No evidence of acute fracture. Sinuses/Orbits: Mild paranasal sinus mucosal thickening. Unremarkable orbits. IMPRESSION: 1. No evidence of acute intracranial abnormality on this motion limited study. 2. Chronic microvascular disease. Electronically Signed   By: Margaretha Sheffield M.D.   On: 09/01/2021 11:31   CT HEAD WO CONTRAST (5MM)  Result Date: 08/31/2021 CLINICAL DATA:  Dementia, urinary tract infection, altered level of consciousness, aggressive behavior EXAM: CT HEAD WITHOUT CONTRAST TECHNIQUE: Contiguous axial images were obtained from the base of the skull through the vertex without intravenous contrast. RADIATION DOSE REDUCTION: This exam was performed according to the departmental dose-optimization program which includes automated exposure control, adjustment of the mA and/or kV according to patient size and/or use of iterative reconstruction technique. COMPARISON:  08/28/2021, 08/30/2021 FINDINGS: Brain: Stable hypodensities throughout the periventricular white matter and central pons consistent with chronic small vessel ischemic change. No evidence of acute infarct or hemorrhage. The lateral ventricles and remaining midline structures are unremarkable. No acute extra-axial fluid collections. No mass effect. Vascular: No hyperdense vessel or unexpected calcification. Skull: Normal. Negative for fracture or focal lesion. Sinuses/Orbits: Polypoid mucosal thickening within the right maxillary, left frontal, left ethmoid sinuses. Stable chronic right mastoid effusion. Other: None. IMPRESSION: 1. No acute intracranial process. 2. Stable chronic small-vessel ischemic changes. Electronically Signed   By: Randa Ngo M.D.   On: 08/31/2021 15:36   CT HEAD WO CONTRAST  Result Date: 08/28/2021 CLINICAL DATA:  Weakness in right arm EXAM: CT HEAD WITHOUT CONTRAST TECHNIQUE: Contiguous axial images were obtained from the base of the skull through the vertex without intravenous contrast. RADIATION DOSE REDUCTION: This exam was performed according to the departmental dose-optimization program which includes automated exposure control, adjustment of the mA and/or kV according to patient size and/or use of iterative reconstruction technique. COMPARISON:  CT head 08/04/2021 FINDINGS: Brain: There is no evidence of acute intracranial hemorrhage, extra-axial fluid collection, or acute infarct. Parenchymal volume is within normal limits. The ventricles are stable in size. There is confluent hypodensity in the subcortical and periventricular white matter likely reflecting sequela of moderate chronic white matter microangiopathy, unchanged. There is  no mass lesion.  There is no midline shift. Vascular: There is calcification of the bilateral cavernous ICAs. Skull: Normal. Negative for fracture or focal lesion. Sinuses/Orbits: The imaged paranasal sinuses are clear. Bilateral lens implants are in place. The globes and orbits are otherwise unremarkable. Other: None. IMPRESSION: No acute intracranial pathology. No significant interval change since 08/04/2021. Electronically Signed   By: Valetta Mole M.D.   On: 08/28/2021 11:24   CT SOFT TISSUE NECK WO CONTRAST  Result Date: 09/01/2021 CLINICAL DATA:  Epiglottitis or tonsillitis suspected Tech note:  New AMS and difficulty swallowing, patient is combative EXAM: CT NECK WITHOUT CONTRAST TECHNIQUE: Multidetector CT imaging of the neck was performed following the standard protocol without intravenous contrast. RADIATION DOSE REDUCTION: This exam was performed according to the departmental dose-optimization program which includes automated exposure control, adjustment of the mA and/or kV  according to patient size and/or use of iterative reconstruction technique. COMPARISON:  None. FINDINGS: Motion study.  Within this limitation: Pharynx and larynx: Limited assessment due to motion and streak artifact from dental amalgam. Within this limitation, no visible mass or clear edema. Salivary glands: No inflammation, mass, or stone. Thyroid: Small left thyroid. Lymph nodes: None enlarged or abnormal density. Vascular: Calcific atherosclerosis. Limited evaluation due to noncontrast technique. Limited intracranial: Negative. Visualized orbits: Negative. Mastoids and visualized paranasal sinuses: Right maxillary sinus retention cyst. Small right mastoid effusion. Skeleton: Severe multilevel degenerative change. Prominent anterior bridging osteophyte at C5-C6 and C6-C7. Upper chest: Visualized lung apices are clear. Other: Approximately 1.2 cm hyperdense soft tissue lesion along the midline at the level of the thyroid cartilage (series 2, image 73), compatible with ectopic thyroid. IMPRESSION: 1. Motion limited study without evidence of acute abnormality in the neck. 2. Prominent anterior bridging osteophyte at C5-C6 and C6-C7, which could potentially represent a source of dysphagia/odynophagia. 3. Approximately 1.2 cm hyperdense lesion along the midline at the level of the thyroid cartilage, compatible with ectopic thyroid (particularly given small left orthotopic thyroid). Recommend correlation with thyroid function labs. Electronically Signed   By: Margaretha Sheffield M.D.   On: 09/01/2021 11:46   MR BRAIN WO CONTRAST  Result Date: 08/30/2021 CLINICAL DATA:  Mental status change, unknown cause. Weakness and confusion. EXAM: MRI HEAD WITHOUT CONTRAST TECHNIQUE: Multiplanar, multiecho pulse sequences of the brain and surrounding structures were obtained without intravenous contrast. COMPARISON:  Head CT 08/28/2021 FINDINGS: Brain: Diffusion imaging does not show any acute or subacute infarction. Chronic  small-vessel ischemic changes affect pons. 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 Advanced chronic small-vessel ischemic changes are present throughout the cerebral hemispheric white matter. No cortical or large vessel territory infarction. No mass lesion, hemorrhage, hydrocephalus or extra-axial collection. Vascular: Major vessels at the base of the brain show flow. Skull and upper cervical spine: Negative Sinuses/Orbits: Clear/normal Other: None IMPRESSION: No acute MR finding. Chronic small-vessel ischemic changes affecting the pons and cerebral hemispheric white matter. Electronically Signed   By: Nelson Chimes M.D.   On: 08/30/2021 12:56   DG CHEST PORT 1 VIEW  Result Date: 09/02/2021 CLINICAL DATA:  Cough EXAM: PORTABLE CHEST 1 VIEW COMPARISON:  Previous studies including the examination of 08/09/2021 FINDINGS: Transverse diameter of heart is increased. There are no signs of pulmonary edema or focal pulmonary consolidation. Prominence of interstitial markings in the lower lung fields has not changed significantly. Costophrenic angles are clear. There is no pneumothorax. Metallic sutures seen in the sternum. IMPRESSION: Are no new infiltrates or signs of pulmonary edema. Electronically  Signed   By: Elmer Picker M.D.   On: 09/02/2021 08:55   EEG adult  Result Date: 09/01/2021 Lora Havens, MD     09/01/2021  8:42 AM Patient Name: MALE MINISH MRN: [id number] Epilepsy Attending: Lora Havens Referring Physician/Provider: Orson Eva, MD Date: 08/31/2021 Duration: 22.39 mins Patient history: 76 year old male with altered mental status and syncope.  EEG to evaluate for seizure. Level of alertness: Awake, asleep AEDs during EEG study: None Technical aspects: This EEG study was done with scalp electrodes positioned according to the 10-20 International system of electrode placement. Electrical activity was acquired at a sampling rate of _0  and reviewed with a high frequency filter  of _1  and a low frequency filter of _2 . EEG data were recorded continuously and digitally stored. Description: The posterior dominant rhythm consists of 7 Hz activity of moderate voltage (25-35 uV) seen predominantly in posterior head regions, symmetric and reactive to eye opening and eye closing. Sleep was characterized by sleep spindles (12 to 14 Hz), maximal frontocentral region.  EEG showed continuous generalized 3 to 6 Hz theta-delta slowing. Hyperventilation and photic stimulation were not performed.   ABNORMALITY - Continuous slow, generalized - Background slow IMPRESSION: This study is suggestive of moderate diffuse encephalopathy, nonspecific etiology. No seizures or epileptiform discharges were seen throughout the recording. Lora Havens   ECHOCARDIOGRAM COMPLETE  Result Date: 08/31/2021    ECHOCARDIOGRAM REPORT   Patient Name:   JAVARUS DORNER Knights Date of Exam: 08/31/2021 Medical Rec #:  [id number]       Height:       76.0 in Accession #:    [id number]      Weight:       260.0 lb Date of Birth:  June 17, 1946      BSA:          2.477 m Patient Age:    76 years        BP:           89/50 mmHg Patient Gender: M               HR:           101 bpm. Exam Location:  [facility] Procedure: 2D Echo, Cardiac Doppler and Color Doppler Indications:    Syncope  History:        Patient has no prior history of Echocardiogram examinations.                 TIA, Arrythmias:RBBB and Tachycardia, Signs/Symptoms:Syncope;                 Risk Factors:Hypertension, Dyslipidemia and Current Smoker.  Sonographer:    Wenda Low Referring Phys: [phone] DAVID TAT  Sonographer Comments: Image acquisition challenging due to patient behavioral factors. Patient is agitated and continually moving. Limited views obtained. IMPRESSIONS  1. Left ventricular ejection fraction, by estimation, is 60 to 65%. The left ventricle has normal function. Left ventricular endocardial border not optimally defined to evaluate regional wall motion.  There is mild left ventricular hypertrophy. Left ventricular diastolic parameters are consistent with Grade I diastolic dysfunction (impaired relaxation).  2. Right ventricular systolic function is normal. The right ventricular size is normal. Tricuspid regurgitation signal is inadequate for assessing PA pressure.  3. The mitral valve is normal in structure. No evidence of mitral valve regurgitation. No evidence of mitral stenosis.  4. The aortic valve was not well visualized. There is mild calcification of the aortic valve. There is mild  thickening of the aortic valve. Aortic valve regurgitation is not visualized. No aortic stenosis is present. FINDINGS  Left Ventricle: Left ventricular ejection fraction, by estimation, is 60 to 65%. The left ventricle has normal function. Left ventricular endocardial border not optimally defined to evaluate regional wall motion. The left ventricular internal cavity size was normal in size. There is mild left ventricular hypertrophy. Left ventricular diastolic parameters are consistent with Grade I diastolic dysfunction (impaired relaxation). Normal left ventricular filling pressure. Right Ventricle: The right ventricular size is normal. No increase in right ventricular wall thickness. Right ventricular systolic function is normal. Tricuspid regurgitation signal is inadequate for assessing PA pressure. Left Atrium: Left atrial size was not well visualized. Right Atrium: Right atrial size was not well visualized. Pericardium: The pericardium was not well visualized. Mitral Valve: The mitral valve is normal in structure. No evidence of mitral valve regurgitation. No evidence of mitral valve stenosis. MV peak gradient, 6.8 mmHg. The mean mitral valve gradient is 2.0 mmHg. Tricuspid Valve: The tricuspid valve is not well visualized. Tricuspid valve regurgitation is not demonstrated. No evidence of tricuspid stenosis. Aortic Valve: The aortic valve was not well visualized. There is mild  calcification of the aortic valve. There is mild thickening of the aortic valve. There is mild aortic valve annular calcification. Aortic valve regurgitation is not visualized. No aortic stenosis is present. Aortic valve mean gradient measures 3.0 mmHg. Aortic valve peak gradient measures 6.6 mmHg. Aortic valve area, by VTI measures 3.83 cm. Pulmonic Valve: The pulmonic valve was not well visualized. Pulmonic valve regurgitation is not visualized. No evidence of pulmonic stenosis. Aorta: The aortic root is normal in size and structure. Venous: The inferior vena cava was not well visualized. IAS/Shunts: The interatrial septum was not well visualized.  LEFT VENTRICLE PLAX 2D LVIDd:         5.40 cm   Diastology LVIDs:         3.60 cm   LV e' medial:    6.42 cm/s LV PW:         1.20 cm   LV E/e' medial:  8.9 LV IVS:        1.40 cm   LV e' lateral:   13.50 cm/s LVOT diam:     2.20 cm   LV E/e' lateral: 4.3 LV SV:         92 LV SV Index:   37 LVOT Area:     3.80 cm  LEFT ATRIUM         Index LA diam:    4.20 cm 1.70 cm/m  AORTIC VALVE AV Area (Vmax):    3.00 cm AV Area (Vmean):   3.56 cm AV Area (VTI):     3.83 cm AV Vmax:           128.00 cm/s AV Vmean:          82.100 cm/s AV VTI:            0.241 m AV Peak Grad:      6.6 mmHg AV Mean Grad:      3.0 mmHg LVOT Vmax:         101.00 cm/s LVOT Vmean:        76.800 cm/s LVOT VTI:          0.243 m LVOT/AV VTI ratio: 1.01  AORTA Ao Root diam: 3.90 cm Ao Asc diam:  3.40 cm MITRAL VALVE MV Area (PHT): 5.79 cm     SHUNTS MV Area VTI:  4.31 cm     Systemic VTI:  0.24 m MV Peak grad:  6.8 mmHg     Systemic Diam: 2.20 cm MV Mean grad:  2.0 mmHg MV Vmax:       1.30 m/s MV Vmean:      69.3 cm/s MV Decel Time: 131 msec MV E velocity: 57.40 cm/s MV A velocity: 113.00 cm/s MV E/A ratio:  0.51 Carlyle Dolly MD Electronically signed by Carlyle Dolly MD Signature Date/Time: 08/31/2021/3:27:18 PM    Final     Microbiology: Results for orders placed or performed during the  hospital encounter of 08/28/21  Resp Panel by RT-PCR (Flu A&B, Covid) Nasopharyngeal Swab     Status: None   Collection Time: 08/28/21 11:17 AM   Specimen: Nasopharyngeal Swab; Nasopharyngeal(NP) swabs in vial transport medium  Result Value Ref Range Status   SARS Coronavirus 2 by RT PCR NEGATIVE NEGATIVE Final    Comment: (NOTE) SARS-CoV-2 target nucleic acids are NOT DETECTED.  The SARS-CoV-2 RNA is generally detectable in upper respiratory specimens during the acute phase of infection. The lowest concentration of SARS-CoV-2 viral copies this assay can detect is 138 copies/mL. A negative result does not preclude SARS-Cov-2 infection and should not be used as the sole basis for treatment or other patient management decisions. A negative result may occur with  improper specimen collection/handling, submission of specimen other than nasopharyngeal swab, presence of viral mutation(s) within the areas targeted by this assay, and inadequate number of viral copies(<138 copies/mL). A negative result must be combined with clinical observations, patient history, and epidemiological information. The expected result is Negative.  Fact Sheet for Patients:  EntrepreneurPulse.com.au  Fact Sheet for Healthcare Providers:  IncredibleEmployment.be  This test is no t yet approved or cleared by the [place] FDA and  has been authorized for detection and/or diagnosis of SARS-CoV-2 by FDA under an Emergency Use Authorization (EUA). This EUA will remain  in effect (meaning this test can be used) for the duration of the COVID-19 declaration under Section 564(b)(1) of the Act, 21 U.S.C.section 360bbb-3(b)(1), unless the authorization is terminated  or revoked sooner.       Influenza A by PCR NEGATIVE NEGATIVE Final   Influenza B by PCR NEGATIVE NEGATIVE Final    Comment: (NOTE) The Xpert Xpress SARS-CoV-2/FLU/RSV plus assay is intended as an aid in the  diagnosis of influenza from Nasopharyngeal swab specimens and should not be used as a sole basis for treatment. Nasal washings and aspirates are unacceptable for Xpert Xpress SARS-CoV-2/FLU/RSV testing.  Fact Sheet for Patients: EntrepreneurPulse.com.au  Fact Sheet for Healthcare Providers: IncredibleEmployment.be  This test is not yet approved or cleared by the [place] FDA and has been authorized for detection and/or diagnosis of SARS-CoV-2 by FDA under an Emergency Use Authorization (EUA). This EUA will remain in effect (meaning this test can be used) for the duration of the COVID-19 declaration under Section 564(b)(1) of the Act, 21 U.S.C. section 360bbb-3(b)(1), unless the authorization is terminated or revoked.  Performed at [facility], [address] [place], [place] [address]   Urine Culture     Status: Abnormal   Collection Time: 08/28/21 12:32 PM   Specimen: Urine, Catheterized  Result Value Ref Range Status   Specimen Description   Final    URINE, CATHETERIZED Performed at [facility], [address], [place], [place] [address]    Special Requests   Final    NONE Performed at [facility], [address] [place], [place]  [address]    Culture >=100,000 COLONIES/mL PSEUDOMONAS AERUGINOSA (A)  Final   Report Status 08/31/2021 FINAL  Final   Organism ID, Bacteria PSEUDOMONAS AERUGINOSA (A)  Final      Susceptibility   Pseudomonas aeruginosa - MIC*    CEFTAZIDIME 4 SENSITIVE Sensitive     CIPROFLOXACIN 0.5 SENSITIVE Sensitive     GENTAMICIN <=1 SENSITIVE Sensitive     IMIPENEM 2 SENSITIVE Sensitive     PIP/TAZO 8 SENSITIVE Sensitive     CEFEPIME 2 SENSITIVE Sensitive     * >=100,000 COLONIES/mL PSEUDOMONAS AERUGINOSA  Culture, blood (routine x 2)     Status: None   Collection Time: 08/31/21 10:41 AM   Specimen: BLOOD  Result Value Ref Range Status   Specimen Description BLOOD LEFT ANTECUBITAL  Final   Special Requests    Final    BOTTLES DRAWN AEROBIC AND ANAEROBIC Blood Culture adequate volume   Culture   Final    NO GROWTH 5 DAYS Performed at [facility], [address], [place], [place] [address]    Report Status 09/05/2021 FINAL  Final  Culture, blood (routine x 2)     Status: None   Collection Time: 08/31/21 10:41 AM   Specimen: BLOOD  Result Value Ref Range Status   Specimen Description BLOOD LEFT ANTECUBITAL  Final   Special Requests   Final    BOTTLES DRAWN AEROBIC AND ANAEROBIC Blood Culture results may not be optimal due to an excessive volume of blood received in culture bottles   Culture   Final    NO GROWTH 5 DAYS Performed at [facility], [address], [place], [place] [address]    Report Status 09/05/2021 FINAL  Final    Labs: CBC: Recent Labs  Lab 08/31/21 0822 09/01/21 0704  WBC 5.0 3.8*  HGB 11.4* 10.6*  HCT 35.6* 33.6*  MCV 99.4 100.9*  PLT 156 655*   Basic Metabolic Panel: Recent Labs  Lab 08/31/21 0512 08/31/21 0822 09/01/21 0704  NA 139 138 140  K 3.6 3.4* 3.8  CL 107 108 109  CO2 _0 GLUCOSE 108* 144* 96  BUN _1 CREATININE 0.88 0.93 0.78  CALCIUM 9.2 9.0 8.6*  MG 1.9  --  1.9   Liver Function Tests: Recent Labs  Lab 08/31/21 0822  AST 20  ALT 14  ALKPHOS 50  BILITOT 0.8  PROT 7.0  ALBUMIN 3.3*   CBG: Recent Labs  Lab 08/31/21 0750  GLUCAP 129*    Discharge time spent: greater than 30 minutes.  Signed: Irwin Brakeman, MD [facility] 09/06/2021

## 2021-09-07 ENCOUNTER — Telehealth: Payer: Self-pay

## 2021-09-07 NOTE — Telephone Encounter (Signed)
Daughter called concerned about fathers catheter sliding up and down causing him discomfort.  Daughter advised to ask nursing staff for a foley anchor to stabilize the catheter. Daughter also expressed the concern of he fathers dark colored urine.  Daughter advised to ensure that her father has adequate fluid intake and to reiterate that to the nursing staff at facility as well. Daughter given patient next f/u date and time.

## 2021-09-08 ENCOUNTER — Ambulatory Visit: Payer: No Typology Code available for payment source | Admitting: Physician Assistant

## 2021-09-14 ENCOUNTER — Ambulatory Visit: Payer: No Typology Code available for payment source | Admitting: Urology

## 2021-11-09 ENCOUNTER — Ambulatory Visit: Payer: No Typology Code available for payment source | Admitting: Internal Medicine

## 2022-09-28 ENCOUNTER — Emergency Department (HOSPITAL_COMMUNITY): Payer: No Typology Code available for payment source

## 2022-09-28 ENCOUNTER — Emergency Department (HOSPITAL_COMMUNITY)
Admission: EM | Admit: 2022-09-28 | Discharge: 2022-09-29 | Disposition: A | Discharge status: Home / Self Care | Payer: No Typology Code available for payment source | Attending: Emergency Medicine | Admitting: Emergency Medicine

## 2022-09-28 ENCOUNTER — Encounter (HOSPITAL_COMMUNITY): Payer: Self-pay | Admitting: Emergency Medicine

## 2022-09-28 ENCOUNTER — Other Ambulatory Visit: Payer: Self-pay

## 2022-09-28 DIAGNOSIS — I10 Essential (primary) hypertension: Secondary | ICD-10-CM | POA: Insufficient documentation

## 2022-09-28 DIAGNOSIS — N390 Urinary tract infection, site not specified: Secondary | ICD-10-CM | POA: Insufficient documentation

## 2022-09-28 DIAGNOSIS — R509 Fever, unspecified: Secondary | ICD-10-CM | POA: Diagnosis present

## 2022-09-28 DIAGNOSIS — R531 Weakness: Secondary | ICD-10-CM

## 2022-09-28 DIAGNOSIS — U071 COVID-19: Secondary | ICD-10-CM | POA: Insufficient documentation

## 2022-09-28 DIAGNOSIS — R0602 Shortness of breath: Secondary | ICD-10-CM | POA: Diagnosis not present

## 2022-09-28 HISTORY — DX: Urinary tract infection, site not specified: N39.0

## 2022-09-28 LAB — CBC WITH DIFFERENTIAL/PLATELET
Abs Immature Granulocytes: 0.02 10*3/uL (ref 0.00–0.07)
Basophils Absolute: 0 10*3/uL (ref 0.0–0.1)
Basophils Relative: 0 %
Eosinophils Absolute: 0 10*3/uL (ref 0.0–0.5)
Eosinophils Relative: 0 %
HCT: 32.4 % — ABNORMAL LOW (ref 39.0–52.0)
Hemoglobin: 10.7 g/dL — ABNORMAL LOW (ref 13.0–17.0)
Immature Granulocytes: 0 %
Lymphocytes Relative: 11 %
Lymphs Abs: 0.5 10*3/uL — ABNORMAL LOW (ref 0.7–4.0)
MCH: 32.2 pg (ref 26.0–34.0)
MCHC: 33 g/dL (ref 30.0–36.0)
MCV: 97.6 fL (ref 80.0–100.0)
Monocytes Absolute: 0.6 10*3/uL (ref 0.1–1.0)
Monocytes Relative: 13 %
Neutro Abs: 3.6 10*3/uL (ref 1.7–7.7)
Neutrophils Relative %: 76 %
Platelets: 95 10*3/uL — ABNORMAL LOW (ref 150–400)
RBC: 3.32 MIL/uL — ABNORMAL LOW (ref 4.22–5.81)
RDW: 13.1 % (ref 11.5–15.5)
WBC: 4.8 10*3/uL (ref 4.0–10.5)
nRBC: 0 % (ref 0.0–0.2)

## 2022-09-28 LAB — RESP PANEL BY RT-PCR (RSV, FLU A&B, COVID)  RVPGX2
Influenza A by PCR: NEGATIVE
Influenza B by PCR: NEGATIVE
Resp Syncytial Virus by PCR: NEGATIVE
SARS Coronavirus 2 by RT PCR: POSITIVE — AB

## 2022-09-28 LAB — URINALYSIS, ROUTINE W REFLEX MICROSCOPIC
Bilirubin Urine: NEGATIVE
Glucose, UA: NEGATIVE mg/dL
Ketones, ur: NEGATIVE mg/dL
Nitrite: NEGATIVE
Protein, ur: NEGATIVE mg/dL
Specific Gravity, Urine: 1.018 (ref 1.005–1.030)
WBC, UA: >50 WBC/hpf (ref 0–5)
pH: 5 (ref 5.0–8.0)

## 2022-09-28 LAB — COMPREHENSIVE METABOLIC PANEL
ALT: 24 U/L (ref 0–44)
AST: 42 U/L — ABNORMAL HIGH (ref 15–41)
Albumin: 3.3 g/dL — ABNORMAL LOW (ref 3.5–5.0)
Alkaline Phosphatase: 48 U/L (ref 38–126)
Anion gap: 5 (ref 5–15)
BUN: 20 mg/dL (ref 8–23)
CO2: 24 mmol/L (ref 22–32)
Calcium: 8.3 mg/dL — ABNORMAL LOW (ref 8.9–10.3)
Chloride: 103 mmol/L (ref 98–111)
Creatinine, Ser: 1.05 mg/dL (ref 0.61–1.24)
GFR, Estimated: >60 mL/min (ref >60)
Glucose, Bld: 105 mg/dL — ABNORMAL HIGH (ref 70–99)
Potassium: 3.9 mmol/L (ref 3.5–5.1)
Sodium: 132 mmol/L — ABNORMAL LOW (ref 135–145)
Total Bilirubin: 1.2 mg/dL (ref 0.3–1.2)
Total Protein: 6.7 g/dL (ref 6.5–8.1)

## 2022-09-28 LAB — LACTIC ACID, PLASMA
Lactic Acid, Venous: 0.9 mmol/L (ref 0.5–1.9)
Lactic Acid, Venous: 1 mmol/L (ref 0.5–1.9)

## 2022-09-28 MED ORDER — CARBIDOPA-LEVODOPA 10-100 MG PO TABS
0.5000 | ORAL_TABLET | Freq: Three times a day (TID) | ORAL | Status: DC
  Administered 2022-09-28: 0.5 via ORAL
  Filled 2022-09-28 (×5): qty 0.5

## 2022-09-28 MED ORDER — ARIPIPRAZOLE 5 MG PO TABS
7.5000 mg | ORAL_TABLET | Freq: Every day | ORAL | Status: DC
  Administered 2022-09-28 – 2022-09-29 (×2): 7.5 mg via ORAL
  Filled 2022-09-28 (×2): qty 2

## 2022-09-28 MED ORDER — SODIUM CHLORIDE 0.9 % IV BOLUS
500.0000 mL | Freq: Once | INTRAVENOUS | Status: AC
  Administered 2022-09-28: 500 mL via INTRAVENOUS

## 2022-09-28 MED ORDER — IOHEXOL 300 MG/ML  SOLN
100.0000 mL | Freq: Once | INTRAMUSCULAR | Status: AC | PRN
  Administered 2022-09-28: 100 mL via INTRAVENOUS

## 2022-09-28 MED ORDER — LEVOTHYROXINE SODIUM 137 MCG PO TABS: 137.0000 ug | ORAL_TABLET | Freq: Every day | ORAL | Status: DC

## 2022-09-28 MED ORDER — ASPIRIN 81 MG PO TBEC
81.0000 mg | DELAYED_RELEASE_TABLET | Freq: Every day | ORAL | Status: DC
  Administered 2022-09-28 – 2022-09-29 (×2): 81 mg via ORAL
  Filled 2022-09-28 (×2): qty 1

## 2022-09-28 MED ORDER — FOLIC ACID 1 MG PO TABS
1.0000 mg | ORAL_TABLET | Freq: Every day | ORAL | Status: DC
  Administered 2022-09-28 – 2022-09-29 (×2): 1 mg via ORAL
  Filled 2022-09-28 (×2): qty 1

## 2022-09-28 MED ORDER — LORAZEPAM 1 MG PO TABS: 1.0000 mg | ORAL_TABLET | Freq: Two times a day (BID) | ORAL | Status: DC | PRN

## 2022-09-28 MED ORDER — CEFDINIR 300 MG PO CAPS
300.0000 mg | ORAL_CAPSULE | Freq: Two times a day (BID) | ORAL | Status: DC
  Administered 2022-09-28 – 2022-09-29 (×3): 300 mg via ORAL
  Filled 2022-09-28 (×3): qty 1

## 2022-09-28 MED ORDER — MOLNUPIRAVIR EUA 200MG CAPSULE: 4.0000 | ORAL_CAPSULE | Freq: Two times a day (BID) | ORAL | 0 refills | Status: AC

## 2022-09-28 MED ORDER — CEFDINIR 300 MG PO CAPS: 300.0000 mg | ORAL_CAPSULE | Freq: Two times a day (BID) | ORAL | 0 refills | Status: AC

## 2022-09-28 MED ORDER — LEVOTHYROXINE SODIUM 137 MCG PO TABS
137.0000 ug | ORAL_TABLET | Freq: Every day | ORAL | Status: DC
  Administered 2022-09-29: 137 ug via ORAL
  Filled 2022-09-28: qty 1

## 2022-09-28 MED ORDER — LACTATED RINGERS IV SOLN: INTRAVENOUS | Status: DC

## 2022-09-28 MED ORDER — PANTOPRAZOLE SODIUM 40 MG PO TBEC
40.0000 mg | DELAYED_RELEASE_TABLET | Freq: Every day | ORAL | Status: DC
  Administered 2022-09-28 – 2022-09-29 (×2): 40 mg via ORAL
  Filled 2022-09-28 (×2): qty 1

## 2022-09-28 MED ORDER — ROSUVASTATIN CALCIUM 20 MG PO TABS
20.0000 mg | ORAL_TABLET | Freq: Every day | ORAL | Status: DC
  Administered 2022-09-28 – 2022-09-29 (×2): 20 mg via ORAL
  Filled 2022-09-28 (×2): qty 1

## 2022-09-28 NOTE — ED Notes (Signed)
Family and Md agreed to keep patient overnight and have physical therapy to evaluate tomorrow.

## 2022-09-28 NOTE — ED Provider Notes (Signed)
Emergency Department Provider Note   I have reviewed the triage vital signs and the nursing notes.   HISTORY  Chief Complaint UTI   HPI Patrick Kerr is a 77 y.o. male past history of hypertension and chronic indwelling Foley with UTI presents the emergency department with fever and generalized weakness.  He was apparently seen in the [facility] emergency room last night and apparently treated for urinary tract infection.  He tells me that his Foley catheter was not changed at that time and it was last changed 1 week prior.  He also apparently tested positive for COVID.  His wife, with whom he lives, also tested positive for COVID.  He was not offered antiviral medication.  He believes he was started on antibiotics but states that the stopped this last night.  He comes in today because of severe weakness which seems to have worsened since his discharge.  He was unable to get up out of bed and thus arrives by EMS.  Denies abdominal or chest pain.  Reports some mild shortness of breath but mainly complaining of fever and profound generalized weakness. No HA.   Past Medical History:  Diagnosis Date   Arthritis    Hypertension    UTI (urinary tract infection)     Review of Systems {** Revise as appropriate then delete this line - Documentation of 10 systems OR 2 systems and "10-point ROS otherwise negative" is required **}Constitutional: No fever/chills Eyes: No visual changes. ENT: No sore throat. Cardiovascular: Denies chest pain. Respiratory: Denies shortness of breath. Gastrointestinal: No abdominal pain.  No nausea, no vomiting.  No diarrhea.  No constipation. Genitourinary: Negative for dysuria. Musculoskeletal: Negative for back pain. Skin: Negative for rash. Neurological: Negative for headaches, focal weakness or numbness. {**Psychiatric:  Endocrine:  Hematological/Lymphatic:  Allergic/Immunilogical: **}  ____________________________________________   PHYSICAL  EXAM:  VITAL SIGNS: ED Triage Vitals  Enc Vitals Group     BP 09/28/22 0924 130/70     Pulse Rate 09/28/22 0925 92     Resp 09/28/22 0924 20     Temp 09/28/22 0925 (!) 100.5 F (38.1 C)     Temp Source 09/28/22 0925 Oral     SpO2 09/28/22 0925 94 %     Weight 09/28/22 0932 235 lb (106.6 kg)     Height 09/28/22 0932 '6\' 4"'$  (1.93 m)     Head Circumference --      Peak Flow --      Pain Score 09/28/22 0931 0     Pain Loc --      Pain Edu? --      Excl. in [place]? --    {** Revise as appropriate then delete this line - 8 systems required **} Constitutional: Alert and oriented. Well appearing and in no acute distress. Eyes: Conjunctivae are normal. PERRL. EOMI. Head: Atraumatic. {**Ears:  Healthy appearing ear canals and TMs bilaterally **}Nose: No congestion/rhinnorhea. Mouth/Throat: Mucous membranes are moist.  Oropharynx non-erythematous. Neck: No stridor.  No meningeal signs.  {**No cervical spine tenderness to palpation.**} Cardiovascular: Normal rate, regular rhythm. Good peripheral circulation. Grossly normal heart sounds.   Respiratory: Normal respiratory effort.  No retractions. Lungs CTAB. Gastrointestinal: Soft and nontender. No distention.  {**Genitourinary:  **}Musculoskeletal: No lower extremity tenderness nor edema. No gross deformities of extremities. Neurologic:  Normal speech and language. No gross focal neurologic deficits are appreciated.  Skin:  Skin is warm, dry and intact. No rash noted. {**Psychiatric: Mood and affect are normal. Speech and  behavior are normal.**}  ____________________________________________   LABS (all labs ordered are listed, but only abnormal results are displayed)  Labs Reviewed  RESP PANEL BY RT-PCR (RSV, FLU A&B, COVID)  [id number]  CULTURE, BLOOD (ROUTINE X 2)  CULTURE, BLOOD (ROUTINE X 2)  COMPREHENSIVE METABOLIC PANEL  LACTIC ACID, PLASMA  LACTIC ACID, PLASMA  CBC WITH DIFFERENTIAL/PLATELET  URINALYSIS, ROUTINE W REFLEX  MICROSCOPIC   ____________________________________________  EKG  *** ____________________________________________  RADIOLOGY  No results found.  ____________________________________________   PROCEDURES  Procedure(s) performed:   Procedures   ____________________________________________   INITIAL IMPRESSION / ASSESSMENT AND PLAN / ED COURSE  Pertinent labs & imaging results that were available during my care of the patient were reviewed by me and considered in my medical decision making (see chart for details).   This patient is Presenting for Evaluation of ***, which {Range:23949} require a range of treatment options, and {MDMcomplaint:23950} a complaint that involves a {MDMlevelrisk:23951} risk of morbidity and mortality.  The Differential Diagnoses include***.  Critical Interventions-    Medications  sodium chloride 0.9 % bolus 500 mL (has no administration in time range)    Reassessment after intervention:     I *** Additional Historical Information from ***, as the patient is ***.  I decided to review pertinent External Data, and in summary ***.   Clinical Laboratory Tests Ordered, included   Radiologic Tests Ordered, included ***. I independently interpreted the images and agree with radiology interpretation.   Cardiac Monitor Tracing which shows ***   Social Determinants of Health Risk ***  Consult complete with  Medical Decision Making: Summary: ***  Reevaluation with update and discussion with   ***Considered admission***  Patient's presentation is most consistent with {EM COPA:27473}   Disposition:   ____________________________________________  FINAL CLINICAL IMPRESSION(S) / ED DIAGNOSES  Final diagnoses:  None     NEW OUTPATIENT MEDICATIONS STARTED DURING THIS VISIT:  New Prescriptions   No medications on file    Note:  This document was prepared using Dragon voice recognition software and may include unintentional  dictation errors.  Nanda Quinton, MD, [facility]

## 2022-09-28 NOTE — ED Notes (Signed)
Pt up for discharge, called next of kin, will be here shortly.

## 2022-09-28 NOTE — TOC Initial Note (Signed)
Transition of Care [facility]) - Initial/Assessment Note    Patient Details  Name: Patrick Kerr MRN: [id number] Date of Birth: 1945/09/22  Transition of Care [facility]) CM/SW Contact:    Ihor Gully, LCSW Phone Number: 09/28/2022, 3:38 PM  Clinical Narrative:                 Patient from home from home. Has DME in home. Being discharged from ED. HH orders placed. Patient is agreeable to [facility] PT, OT, swkr, aide orders placed and referral accepted by [facility]. Patient has worked with this company in the past for [facility] needs.   Expected Discharge Plan: [place] Barriers to Discharge: No Barriers Identified   Patient Goals and CMS Choice            Expected Discharge Plan and Services       Living arrangements for the past 2 months: Single Family Home                                      Prior Living Arrangements/Services Living arrangements for the past 2 months: Single Family Home                     Activities of Daily Living      Permission Sought/Granted                  Emotional Assessment     Affect (typically observed): Appropriate Orientation: : Oriented to Self, Oriented to Place, Oriented to  Time, Oriented to Situation Alcohol / Substance Use: Not Applicable    Admission diagnosis:  ems--?uti Patient Active Problem List   Diagnosis Date Noted   Acute urinary retention 09/05/2021   Pseudomonas UTI 09/02/2021   Orthostatic syncope 09/01/2021   Syncope and collapse [id number]   Acute metabolic encephalopathy [id number]   TIA (transient ischemic attack)    Ureteral calculus, right 08/23/2021   Urinary tract infection without hematuria 08/23/2021   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   Urticaria 08/04/2021   Sciatica 08/04/2021   Acute upper respiratory infection 08/04/2021   Age-related physical debility 08/04/2021   Dermatophytosis of body 08/04/2021   Dyslipidemia 08/04/2021   Dysuria  08/04/2021   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   Neurocognitive disorder 08/04/2021   Other and unspecified hyperlipidemia 08/04/2021   Other B-complex deficiencies 08/04/2021   Other chronic pain 08/04/2021   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   Other seborrheic keratosis 08/04/2021   Encounter for other specified special examinations 08/04/2021   Person encountering health services to consult on behalf of another person 08/04/2021   Other specified counseling 08/04/2021   Other specified disease of sebaceous glands 08/04/2021   Other spondylosis, lumbosacral region 08/04/2021   Lumbosacral spondylosis 08/04/2021   Phimosis 08/04/2021   Rash and other nonspecific skin eruption 08/04/2021   Venous insufficiency (chronic) (peripheral) 08/04/2021   Vitamin D deficiency 08/04/2021   Hypothyroidism, unspecified 08/04/2021   Thyroid disease 08/04/2021   Benign essential hypertension 08/04/2021   Essential (primary) hypertension 08/04/2021   Anemia 08/04/2021   Unspecified mental disorder due to known physiological condition 08/04/2021   Anxiety disorder, unspecified 08/04/2021   Post-traumatic stress disorder, chronic 08/04/2021   Obesity 08/04/2021   At risk for falls 08/04/2021   Enlarged prostate 08/04/2021   Irritability 08/04/2021   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   Klebsiella sepsis/UTI  with Septic Shock 07/29/2021   AKI (acute kidney injury) ([place]) 07/29/2021   Pressure injury of skin 07/24/2021   Sepsis secondary to UTI ([place]) 07/18/2021   Macrocytic anemia 07/18/2021   Hypokalemia 07/18/2021   Thrombocytopenia ([place]) 07/18/2021   Bladder outlet obstruction 07/18/2021   Low TSH level 07/18/2021   Lactic acidosis 07/18/2021   Obesity (BMI 30.0-34.9) 07/18/2021   Essential hypertension 07/18/2021   Acquired hypothyroidism 07/18/2021   Personal history of other malignant neoplasm of skin 11/30/2020   Foot pain 06/04/2020   Hallux valgus  06/04/2020   Hammer toe 06/04/2020   Ingrowing toenail 06/04/2020   Prediabetes 12/31/2019   Protrusion of cervical intervertebral disc 07/20/2017   Status post lumbar spinal fusion 07/20/2017   Acute postoperative pain 06/20/2014   Coagulopathy ([place]) 06/20/2014   Postoperative anemia due to acute blood loss 06/20/2014   Arterial hypotension 06/20/2014   Prostatitis, chronic 05/27/2014   Right lumbar radiculopathy 05/06/2014   Herniated thoracic disc without myelopathy 04/29/2014   Spinal stenosis, cervical region 04/29/2014   PTSD (post-traumatic stress disorder) 01/31/2013   BPH with urinary obstruction 12/27/2012   Depression 12/27/2012   RBBB 12/27/2012   Tachycardia 12/27/2012   Tobacco use disorder 12/27/2012   HTN (hypertension) 12/27/2012   Anxiety 12/27/2012   Acquired spondylolisthesis 12/11/2012   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

## 2022-09-28 NOTE — ED Triage Notes (Signed)
Pt BIB [facility] from home, pt seen ad [facility] ER last night for possible UTI, pt has chronic hx of UTIs monthly, has foley in place upon arrival with dark urine noted, pt dx with COVID last night, v/s with EMS temp 100.1, 146/80, NSR, HR99, 98% RA

## 2022-09-28 NOTE — Consult Note (Signed)
Initial Consultation Note   Patient: Patrick Kerr [id number] DOB: [birth year] PCP: Ludwig Clarks, FNP DOA: 09/28/2022 DOS: the patient was seen and examined on 09/28/2022 Primary service: Margette Fast, MD  Referring physician: Dr. Laverta Baltimore (EDP). Reason for consult: Generalized weakness/COVID infection and UTI.  Assessment and Plan: 1-generalized weakness -Multifactorial in the setting of COVID infection, physical deconditioning and presumed UTI. -Patient with low-grade temperature, no nausea, no vomiting, stable renal function, electrolytes and WBCs. -No complaining of significant abdominal discomfort for or dysuria. -No requiring oxygen supplementation and with no significant pulmonary infiltrates. -After discussing with patient and examining him he feels adequately stable to go home with Paxlovid and oral antibiotics. -Adequate hydration and supportive and bronchodilator management care has been discussed with patient -Home health services to provide physical therapy, nurse, aide social worker and OT will be arranged through advanced home care. -Outpatient follow-up with PCP in 10 days recommended. -Proper use of facial mask, social distancing and good hand hygiene discussed with patient to promote healthy habits and minimize propagation.  2-depression/anxiety -Continue home use of antidepressant/anxiolytics.  3-history of Parkinson -Continue Sinemet.  4-gastroesophageal reflux disease -Continue PPI and Carafate.  5-essential hypertension -Resume home antihypertensive agents.  6-hyperlipidemia -Continue statin once treatment with Paxlovid completed.  7-BPH -Continue Flomax and Proscar.  8-hypothyroidism -Continue Synthroid.  TRH will sign off at present, please call us again when needed.  HPI: Patrick Kerr is a 77 y.o. male with past medical history of recurrent UTIs, hypertension, hyperlipidemia, BPH, depression/anxiety, osteoarthritis and hypothyroidism; who  presented to the hospital secondary to generalized weakness and general malaise.  Patient reported recent suprapubic catheter exchange approximately a week ago and also a recent visit to a different health system where he was found to be positive for COVID.  Patient denies nausea, vomiting, shortness of breath, chest pain, abdominal pain or hematuria.  Low-grade temperature appreciated while in the ED.  Patient with good oxygen saturation and chest x-ray demonstrating just atelectatic changes.  Patient's symptoms have been present for the last 3 days at time of presentation.  Patient blood work overall stable and demonstrating normal WBCs.  Patient expressed not wanting to be admitted to the hospital and is possible to be treated as an outpatient.  Hemodynamically stable and a good candidate for treatment with Paxlovid as an outpatient.  Review of Systems: As mentioned in the history of present illness. All other systems reviewed and are negative. Past Medical History:  Diagnosis Date   Arthritis    Hypertension    UTI (urinary tract infection)    Past Surgical History:  Procedure Laterality Date   CIRCUMCISION  05/25/2021   TRANSURETHRAL RESECTION OF PROSTATE     Social History:  reports that he has never smoked. He has never used smokeless tobacco. He reports that he does not drink alcohol and does not use drugs.  Allergies  Allergen Reactions   Sulfamethoxazole-Trimethoprim Other (See Comments) and Rash   Amitriptyline Other (See Comments)    Elavil makes him "hyper".   Azithromycin Other (See Comments)    Causes ulcers   Erythromycin Other (See Comments)    Pt states gave him an ulcer   Erythromycin Base    Mirabegron Other (See Comments)    Headache   Prednisone Itching and Other (See Comments)    Numbness, pt states feet felt like they were on fire   Hydromorphone Itching    History reviewed. No pertinent family history.  Prior to Admission medications  Medication Sig  Start Date End Date Taking? Authorizing Provider  ARIPiprazole (ABILIFY) 5 MG tablet Take 1.5 tablets by mouth daily. 07/13/22  Yes [provider]  aspirin EC 81 MG tablet TAKE ONE TABLET BY MOUTH EVERY DAY FOR HEART 06/09/22  Yes [provider]  carbidopa-levodopa (SINEMET IR) 10-100 MG tablet Take 0.5 tablets by mouth 3 (three) times daily. 09/06/22  Yes [provider]  cefdinir (OMNICEF) 300 MG capsule Take 1 capsule (300 mg total) by mouth 2 (two) times daily for 7 days. 09/28/22 10/05/22 Yes Long, Wonda Olds, MD  doxycycline (ADOXA) 100 MG tablet Take 100 mg by mouth 2 (two) times daily. 08/15/22  Yes [provider]  ipratropium-albuterol (DUONEB) 0.5-2.5 (3) MG/3ML SOLN SMARTSIG:3 Milliliter(s) Via Nebulizer Every 6 Hours PRN 04/12/22  Yes [provider]  L-Methylfolate-B6-B12 (ELFOLATE PLUS) 3-35-2 MG TABS Take by mouth. 09/22/22  Yes [provider]  linezolid (ZYVOX) 600 MG tablet Take 600 mg by mouth 2 (two) times daily. 05/13/22  Yes [provider]  LORazepam (ATIVAN) 1 MG tablet Take 1 tablet by mouth 2 (two) times daily as needed. 08/09/22  Yes [provider]  molnupiravir EUA (LAGEVRIO) 200 mg CAPS capsule Take 4 capsules (800 mg total) by mouth 2 (two) times daily for 5 days. 09/28/22 10/03/22 Yes Long, Wonda Olds, MD  mupirocin ointment (BACTROBAN) 2 % PLEASE SEE ATTACHED FOR DETAILED DIRECTIONS 05/18/22  Yes [provider]  ondansetron (ZOFRAN-ODT) 4 MG disintegrating tablet 4 mg every 4 (four) hours as needed. 06/03/22  Yes [provider]  pantoprazole (PROTONIX) 40 MG tablet Take 1 tablet by mouth daily. 07/11/21  Yes [provider]  rosuvastatin (CRESTOR) 20 MG tablet TAKE ONE TABLET BY MOUTH EVERY NIGHT AT BEDTIME FOR HIGH CHOLESTEROL 06/09/22  Yes [provider]  sucralfate (CARAFATE) 1 g tablet TAKE 1 TABLET BY MOUTH THREE TIMES A DAY FOR 30 DAYS 07/11/21  Yes [provider]  Zinc Oxide 40 % PSTE APPLY A THIN LAYER TOPICALLY TWICE A DAY TO WOUND 09/16/22  Yes [provider]  Acetaminophen 325 MG CAPS Take 2 capsules by mouth every 4 (four) hours as needed. 09/06/21   Johnson, Clanford L, MD  albuterol (PROVENTIL) (2.5 MG/3ML) 0.083% nebulizer solution Take 3 mLs (2.5 mg total) by nebulization every 4 (four) hours as needed for wheezing or shortness of breath. 07/29/21 07/29/22  Roxan Hockey, MD  amLODipine (NORVASC) 10 MG tablet Take 1 tablet by mouth daily.    [provider]  ARIPiprazole (ABILIFY) 2 MG tablet Take 1 tablet (2 mg total) by mouth daily. 09/06/21   Johnson, Clanford L, MD  cephALEXin (KEFLEX) 500 MG capsule TAKE 1 CAPSULE BY MOUTH FOUR TIMES A DAY    [provider]  ciprofloxacin (CIPRO) 500 MG tablet Take 1 tablet by mouth 2 (two) times daily.    [provider]  cyanocobalamin (,VITAMIN B-12,) 1000 MCG/ML injection Inject 1 mL (1,000 mcg total) into the muscle every 30 (thirty) days. 09/06/21   Johnson, Clanford L, MD  Docusate Sodium (DSS) 100 MG CAPS Take 1 capsule by mouth daily. 07/15/21   [provider]  doxycycline (VIBRA-TABS) 100 MG tablet Take 100 mg by mouth 2 (two) times daily.    [provider]  Ergocalciferol 50 MCG (2000 UT) TABS Take 1 tablet by mouth daily.    [provider]  feeding supplement (ENSURE ENLIVE / ENSURE PLUS) LIQD Take 237 mLs by mouth 3 (three)  times daily between meals. 07/29/21   Roxan Hockey, MD  finasteride (PROSCAR) 5 MG tablet Take 1 tablet (5 mg total) by mouth daily. 08/03/21   Franchot Gallo, MD  fluconazole (DIFLUCAN) 150 MG tablet TAKE 1 TABLET BY MOUTH FOR 1 DAY    [provider]  folic acid (FOLVITE) 1 MG tablet Take 1 tablet (1 mg total) by mouth daily. 09/07/21   Johnson, Clanford L, MD  furosemide (LASIX) 20 MG tablet Take 1 tablet by mouth as needed.    [provider]  levothyroxine (SYNTHROID, LEVOTHROID) 137  MCG tablet Take 137 mcg by mouth daily before breakfast.    [provider]  lisinopril (ZESTRIL) 20 MG tablet Take 1 tablet by mouth daily.    [provider]  LORazepam (ATIVAN) 0.5 MG tablet Take 1/2 in the morning and 1/2 at noon and 1 mg around 7pm 09/06/21   Johnson, Clanford L, MD  losartan (COZAAR) 25 MG tablet Take 1 tablet by mouth daily.    [provider]  melatonin 3 MG TABS tablet Take 2 tablets (6 mg total) by mouth at bedtime. 09/06/21   Johnson, Clanford L, MD  nitrofurantoin, macrocrystal-monohydrate, (MACROBID) 100 MG capsule Take 1 capsule by mouth 2 (two) times daily.    [provider]  nystatin (MYCOSTATIN/NYSTOP) powder Apply topically 2 (two) times daily. 09/06/21   Johnson, Clanford L, MD  ondansetron (ZOFRAN) 4 MG tablet Take 1 tablet by mouth every 6 (six) hours as needed.    [provider]  oxyBUTYnin Chloride 2.5 MG TABS Take 2 tablets twice a day by oral route.    [provider]  PARoxetine (PAXIL) 10 MG tablet Take 10 mg by mouth daily. 08/09/21   [provider]  phenazopyridine (PYRIDIUM) 200 MG tablet Take 1 tablet by mouth 3 (three) times daily.    [provider]  polyethylene glycol (MIRALAX / GLYCOLAX) 17 g packet Take 17 g by mouth daily. 07/30/21   Roxan Hockey, MD  QUEtiapine (SEROQUEL) 25 MG tablet TAKE 1 TABLET BY MOUTH EVERYDAY AT BEDTIME    [provider]  Sertraline HCl 150 MG CAPS Take 1 capsule every day by oral route.    [provider]  Sertraline HCl 200 MG CAPS Take 1 capsule every day by oral route.    [provider]  simvastatin (ZOCOR) 10 MG tablet Take 1 tablet every day by oral route.    [provider]  tamsulosin (FLOMAX) 0.4 MG CAPS capsule Take 0.4 mg by mouth daily after breakfast.    [provider]  traMADol (ULTRAM) 50 MG tablet Take 50 mg by mouth every 6 (six) hours as needed.    [provider]   valACYclovir (VALTREX) 1000 MG tablet TAKE 1 TABLET (1,000 MG TOTAL) BY MOUTH 3 (THREE) TIMES DAILY FOR 5 DAYS.    [provider]    Physical Exam: Vitals:   09/28/22 1200 09/28/22 1230 09/28/22 1257 09/28/22 1500  BP:   118/62 121/69  Pulse: 96 87 86   Resp: '18 15 18 '$ (!) 22  Temp:   99.2 F (37.3 C)   TempSrc:      SpO2: 96% 96% 95%   Weight:      Height:       General exam: Alert, awake, oriented x 3; speaking in full sentences and demonstrating good saturation on room air. Respiratory system: Good air movement bilaterally, no using accessory muscle.  Positive scattered rhonchi; no wheezing  or crackles. Cardiovascular system:RRR. No rubs or gallops; no JVD. Gastrointestinal system: Abdomen is nondistended, soft and nontender. No organomegaly or masses felt. Normal bowel sounds heard. Central nervous system: Alert and oriented. No focal neurological deficits. Extremities: No cyanosis or clubbing.   Skin: No rashes, no petechiae. Psychiatry: Judgement and insight appear normal. Mood & affect appropriate.   Family Communication: No family at bedside.  Primary team communication: Direct discussion of findings and recommendations with Dr.Long EDP.  Thank you very much for involving [place] in the care of your patient.  Author: Barton Dubois, MD 09/28/2022 5:02 PM  For on call review [email or web address].

## 2022-09-28 NOTE — Discharge Instructions (Signed)
Seen the emergency room today with general weakness.  Retreating with antibiotic and an antiviral medicine for COVID.  Please continue your home medications and drink plenty of fluids.  Return with any new or suddenly worsening symptoms.

## 2022-09-28 NOTE — ED Provider Notes (Signed)
Signout from Dr. Laverta Baltimore.  77 year old male suprapubic cath with multiple UTIs now with COVID here after fall and generalized weakness.  He has been evaluated by hospitalist and no clear indications for admission.  Son-in-law here now advocating that patient not safe to return home, was just discharged from [facility] yesterday and has already fallen once since then.  Will keep patient overnight and have physical therapy and social work involved in the morning to see what her options are. Physical Exam  BP 128/70   Pulse 88   Temp 98.1 F (36.7 C) (Oral)   Resp 20   Ht '6\' 4"'$  (1.93 m)   Wt 106.6 kg   SpO2 95%   BMI 28.61 kg/m   Physical Exam  Procedures  Procedures  ED Course / MDM    Medical Decision Making Amount and/or Complexity of Data Reviewed Labs: ordered. Radiology: ordered.  Risk Prescription drug management.          Hayden Rasmussen, MD 09/29/22 0830

## 2022-09-28 NOTE — ED Notes (Signed)
Family at bedside, but preparing to leave for the evening. Aware pt will remain overnight.

## 2022-09-29 NOTE — ED Notes (Signed)
Ambulated patient to the glass doors of room and back to bed with the assistance of a walker.

## 2022-09-29 NOTE — ED Provider Notes (Signed)
.     Milton Ferguson, MD 09/29/22 1407

## 2022-10-03 LAB — CULTURE, BLOOD (ROUTINE X 2)
Culture: NO GROWTH
Culture: NO GROWTH
Special Requests: ADEQUATE
Special Requests: ADEQUATE

## 2023-02-03 IMAGING — CT CT ABD-PELV W/ CM
2 of 6 series · 13 of 46 positions shown, 15 images · IV contrast (Omnipaque or Isovue)
Comparison: 04/23/2021

CLINICAL DATA: Chronic rectal and penile pain. Weight loss and
fatigue.

EXAM:
CT ABDOMEN AND PELVIS WITH CONTRAST
TECHNIQUE: Multidetector CT imaging of the abdomen and pelvis was performed
using the standard protocol following bolus administration of
intravenous contrast.
CONTRAST:  100mL OMNIPAQUE IOHEXOL 300 MG/ML  SOLN

[Series 2: axial st · axial · 0.98mm/px · z∈[+1000,+1430]mm · 10 of 106 slices shown, 12 images]
[im 10/106  soft-tissue]
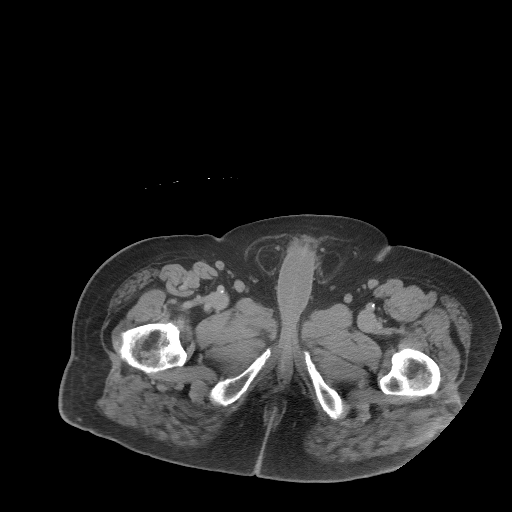
[im 10/106  bone]
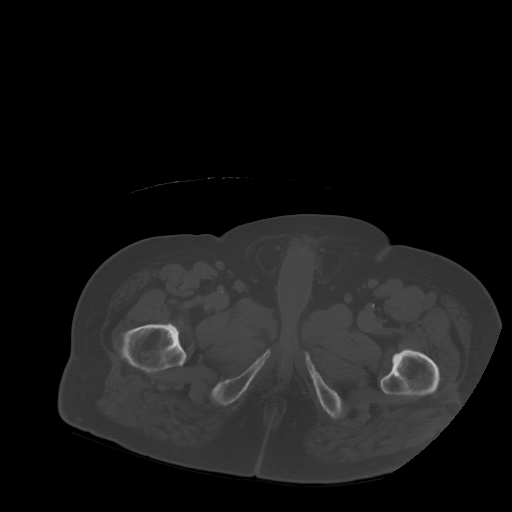
[im 20/106  soft-tissue]
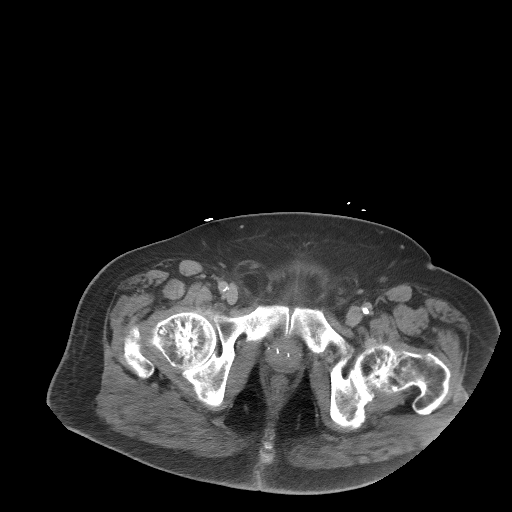
[im 29/106  soft-tissue]
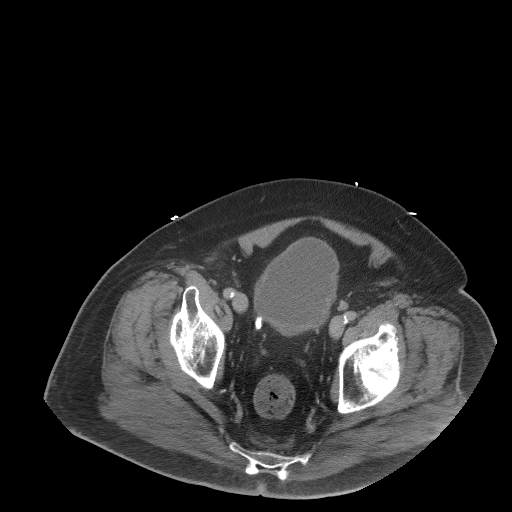
[im 39/106  soft-tissue]
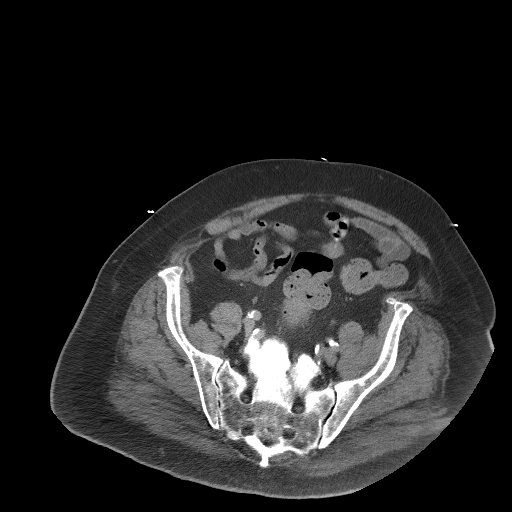
[im 48/106  soft-tissue]
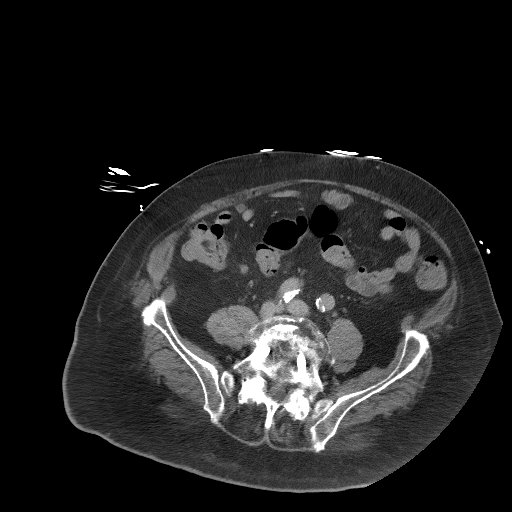
[im 58/106  soft-tissue]
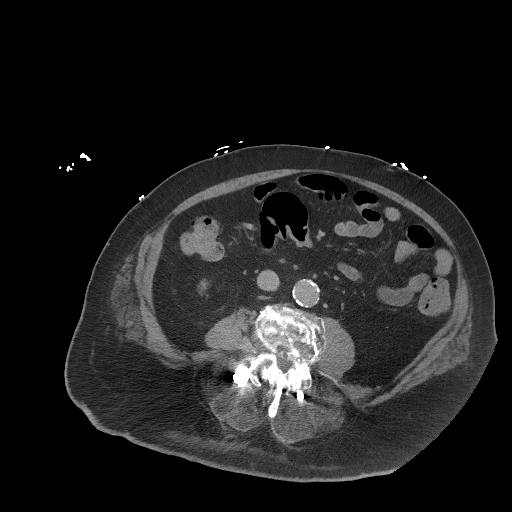
[im 67/106  soft-tissue]
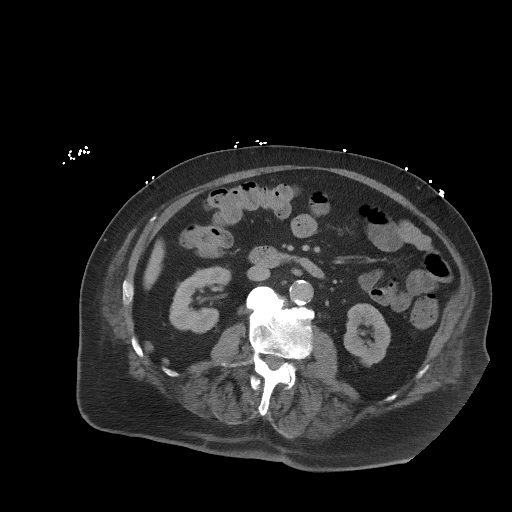
[im 77/106  soft-tissue]
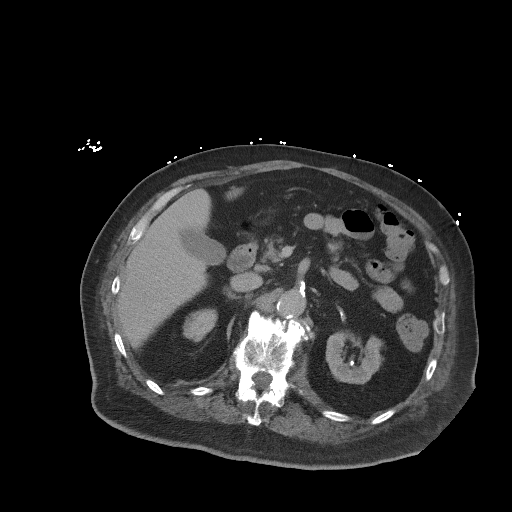
[im 86/106  soft-tissue]
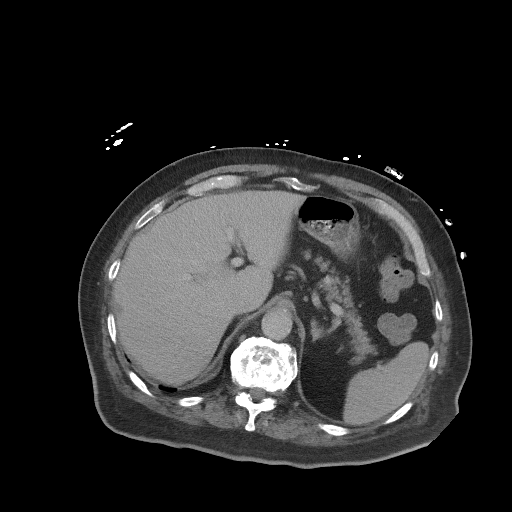
[im 86/106  bone]
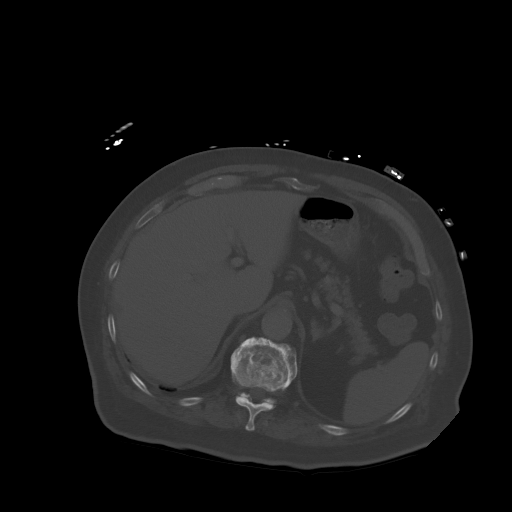
[im 96/106  soft-tissue]
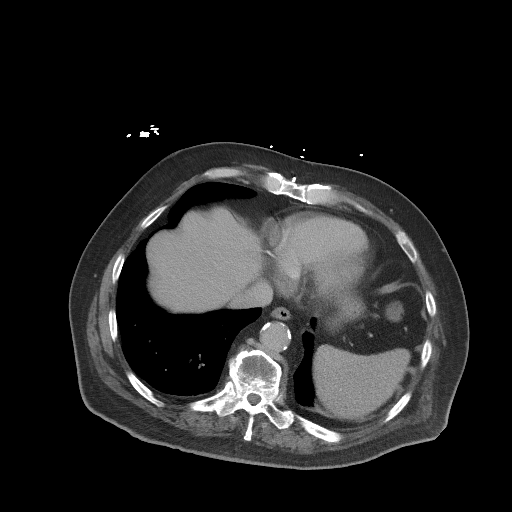

[Series 5: coronal st · coronal · 0.89mm/px · 3 of 133 slices shown]
[im 45/133  soft-tissue]
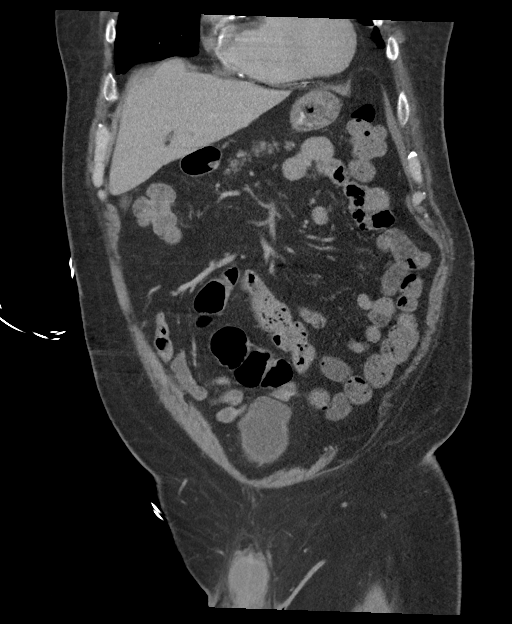
[im 59/133  soft-tissue]
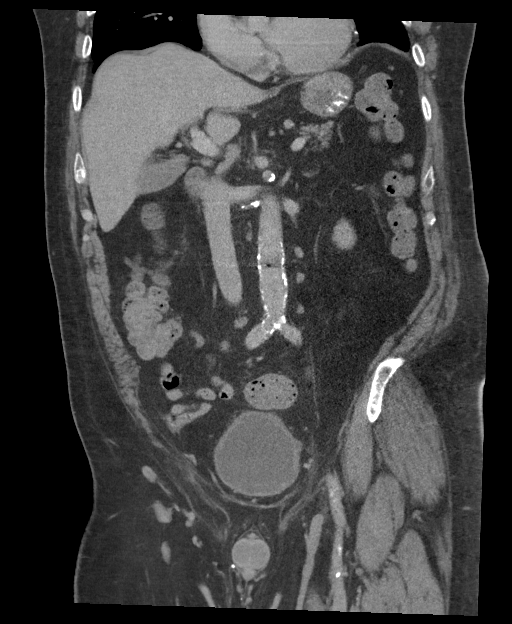
[im 74/133  soft-tissue]
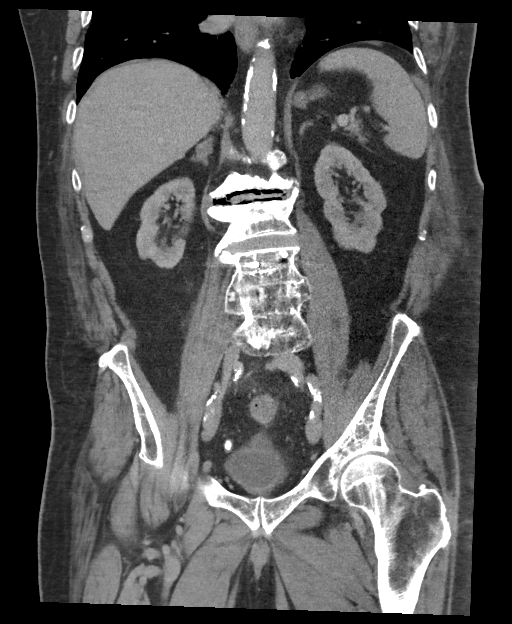

[13 of 46 positions shown; findings below may reference images not displayed]

FINDINGS: Lower chest: No acute abnormality.

Hepatobiliary: Liver normal in size. Subcentimeter low-density
lesion, anterior aspect of the lateral segment of the left lobe,
consistent with a cyst. No other liver masses or lesions. Normal
gallbladder. No bile duct dilation.

Pancreas: Unremarkable. No pancreatic ductal dilatation or
surrounding inflammatory changes.

Spleen: Normal in size without focal abnormality.

Adrenals/Urinary Tract: Small adrenal nodules, both 11 mm, stable.
Kidneys normal in overall size, orientation and position. No renal
masses. Small bilateral intrarenal stones. No hydronephrosis.

Stones noted in the distal right ureter at and just above the
ureterovesicular junction, stable from the prior CT. This causes no
signs of obstruction. Normal left ureter.

Bladder wall is irregular with several cellule. No bladder mass or
stone.

Stomach/Bowel: Stomach is unremarkable. Small bowel and colon are
normal in caliber. No wall thickening. No inflammation. Normal
appendix visualized.

Vascular/Lymphatic: Aortic atherosclerosis. No aneurysm. Scattered
subcentimeter retroperitoneal lymph nodes. No enlarged lymph nodes.

Reproductive: Findings consistent with a previous TURP. Densities
within the prostate consistent with radiation therapy seeds. These
findings are stable.

Other: No abdominal wall hernia or abnormality. No abdominopelvic
ascites.

Musculoskeletal: No fracture or acute finding. No aggressive bone
lesion. Previous posterior lumbar spine fusion, L3 through L5.
Advanced degenerative changes throughout the visualized spine.
IMPRESSION: 1. No acute findings within the abdomen or pelvis.
2. Stones in the distal right ureter without findings of
obstruction, and unchanged compared to the prior CT. Small
nonobstructing intrarenal stones.
3. Bladder appearance is consistent with chronic bladder outlet
obstruction, also unchanged.
4. Aortic atherosclerosis.

## 2023-02-03 IMAGING — DX DG ABDOMEN ACUTE W/ 1V CHEST
4 series · 4 of 4 positions shown · non-contrast
Comparison: CT abdomen pelvis 04/23/2021

CLINICAL DATA: Weakness, constipation.

EXAM:
DG ABDOMEN ACUTE WITH 1 VIEW CHEST

[abdomen erect]
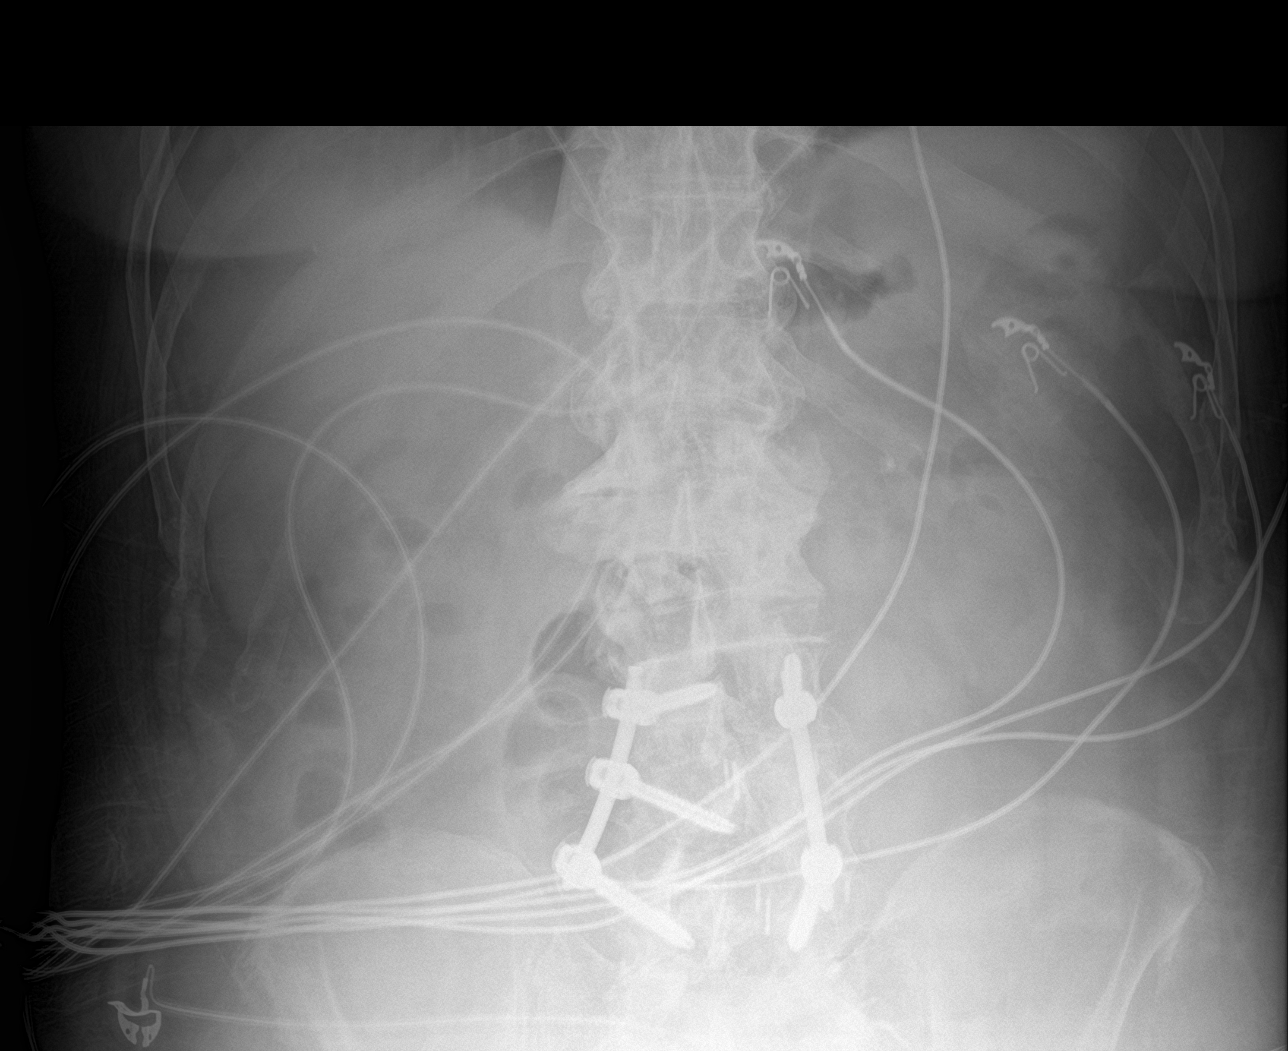

[abdomen supine (1 of 2)]
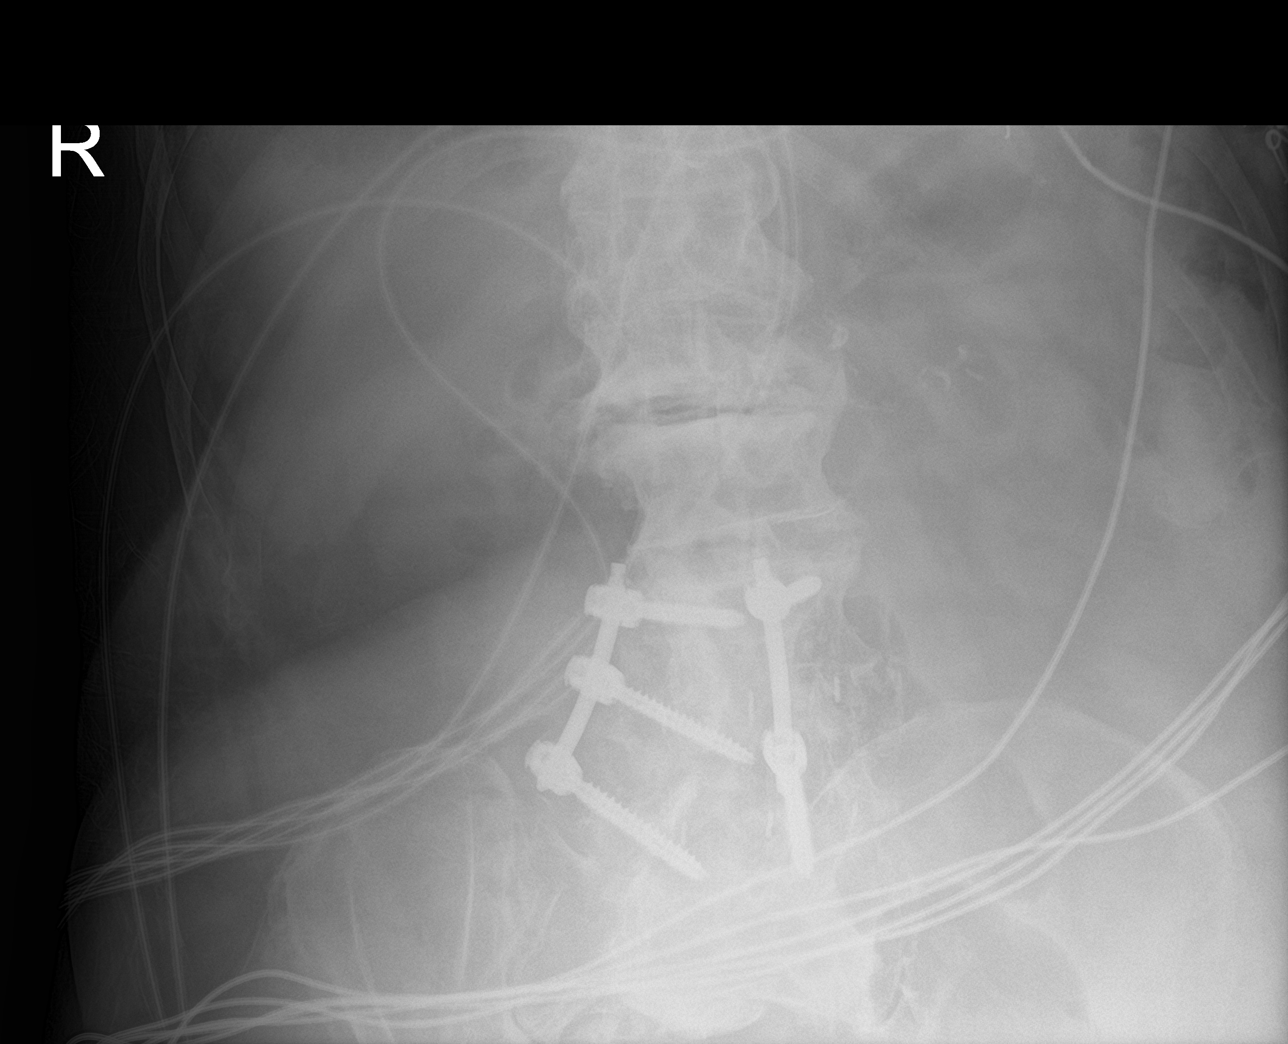

[chest ap]
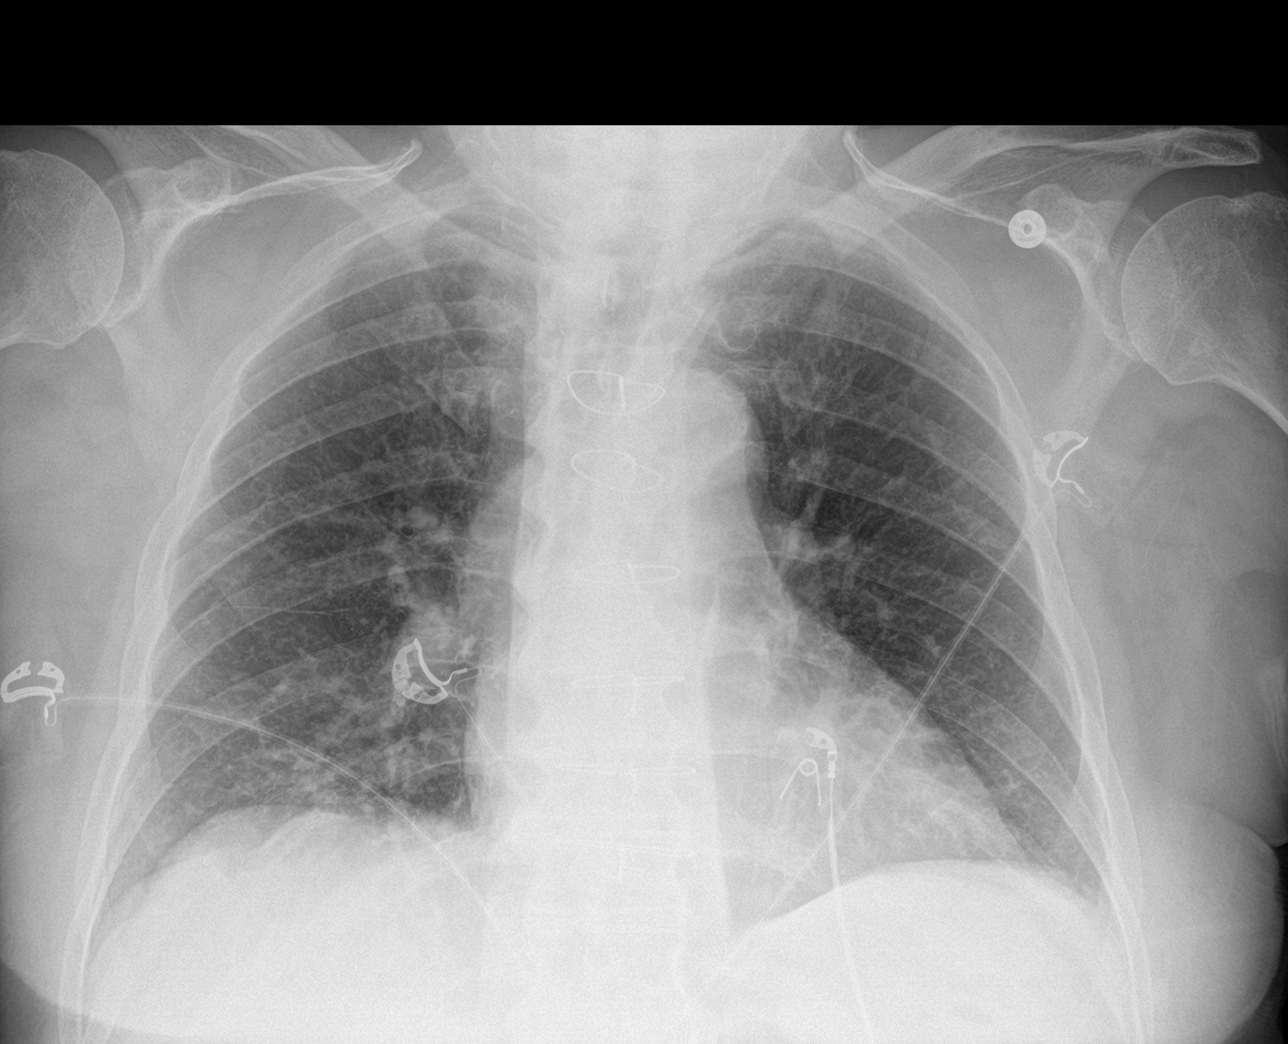

[abdomen supine (2 of 2)]
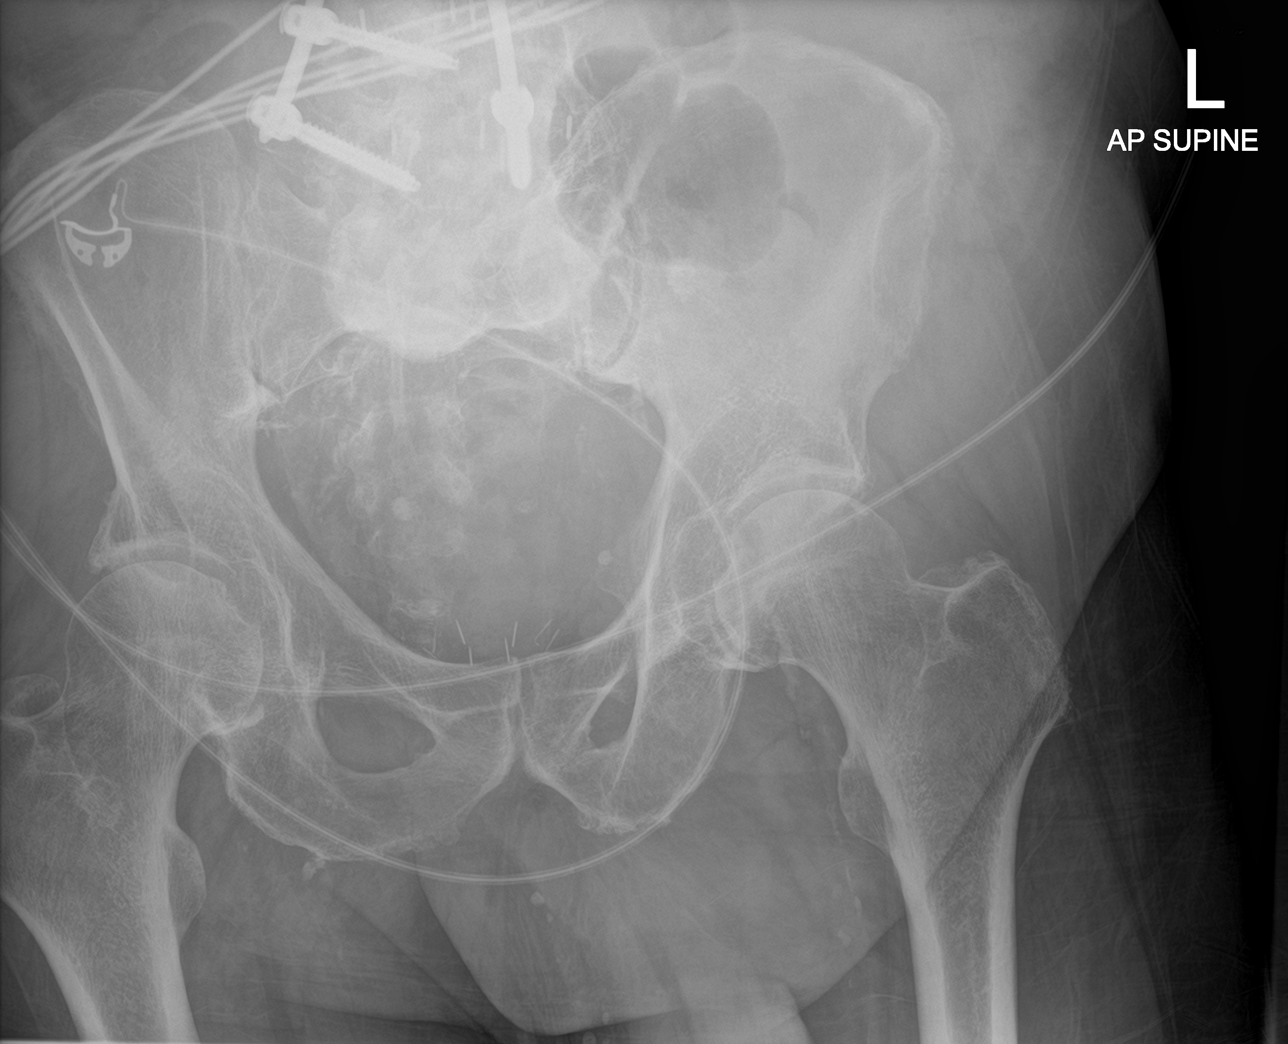

[4 of 4 positions shown; findings below may reference images not displayed]

FINDINGS: There is no evidence of dilated bowel loops or free intraperitoneal
air. Thin linear radiopaque calculi projecting in the medial left
abdomen correspond to vascular calcifications seen on prior CT
04/23/2021. Surgical clips project at the low central pelvis from
prior TURP. Bilateral pedicle screw and rod fixation spans L3-L5
with intervertebral spacers. Severe degenerative disc disease at
L1-L2 and L2-L3.

Postoperative changes of median sternotomy. Heart size and
mediastinal contours are within normal limits. No focal
consolidation, pleural effusion, or pneumothorax. Subsegmental
bibasilar atelectasis.
IMPRESSION: Nonobstructive bowel gas pattern on abdominal radiographs.

No acute cardiopulmonary abnormality.

## 2023-02-04 IMAGING — DX DG CHEST 1V
1 series · 1 of 1 positions shown · non-contrast
Comparison: 07/18/2021 at [DATE] a.m.

CLINICAL DATA: Short of breath. Intubated patient. Follow-up exam.

EXAM:
CHEST  1 VIEW

[chest ap]
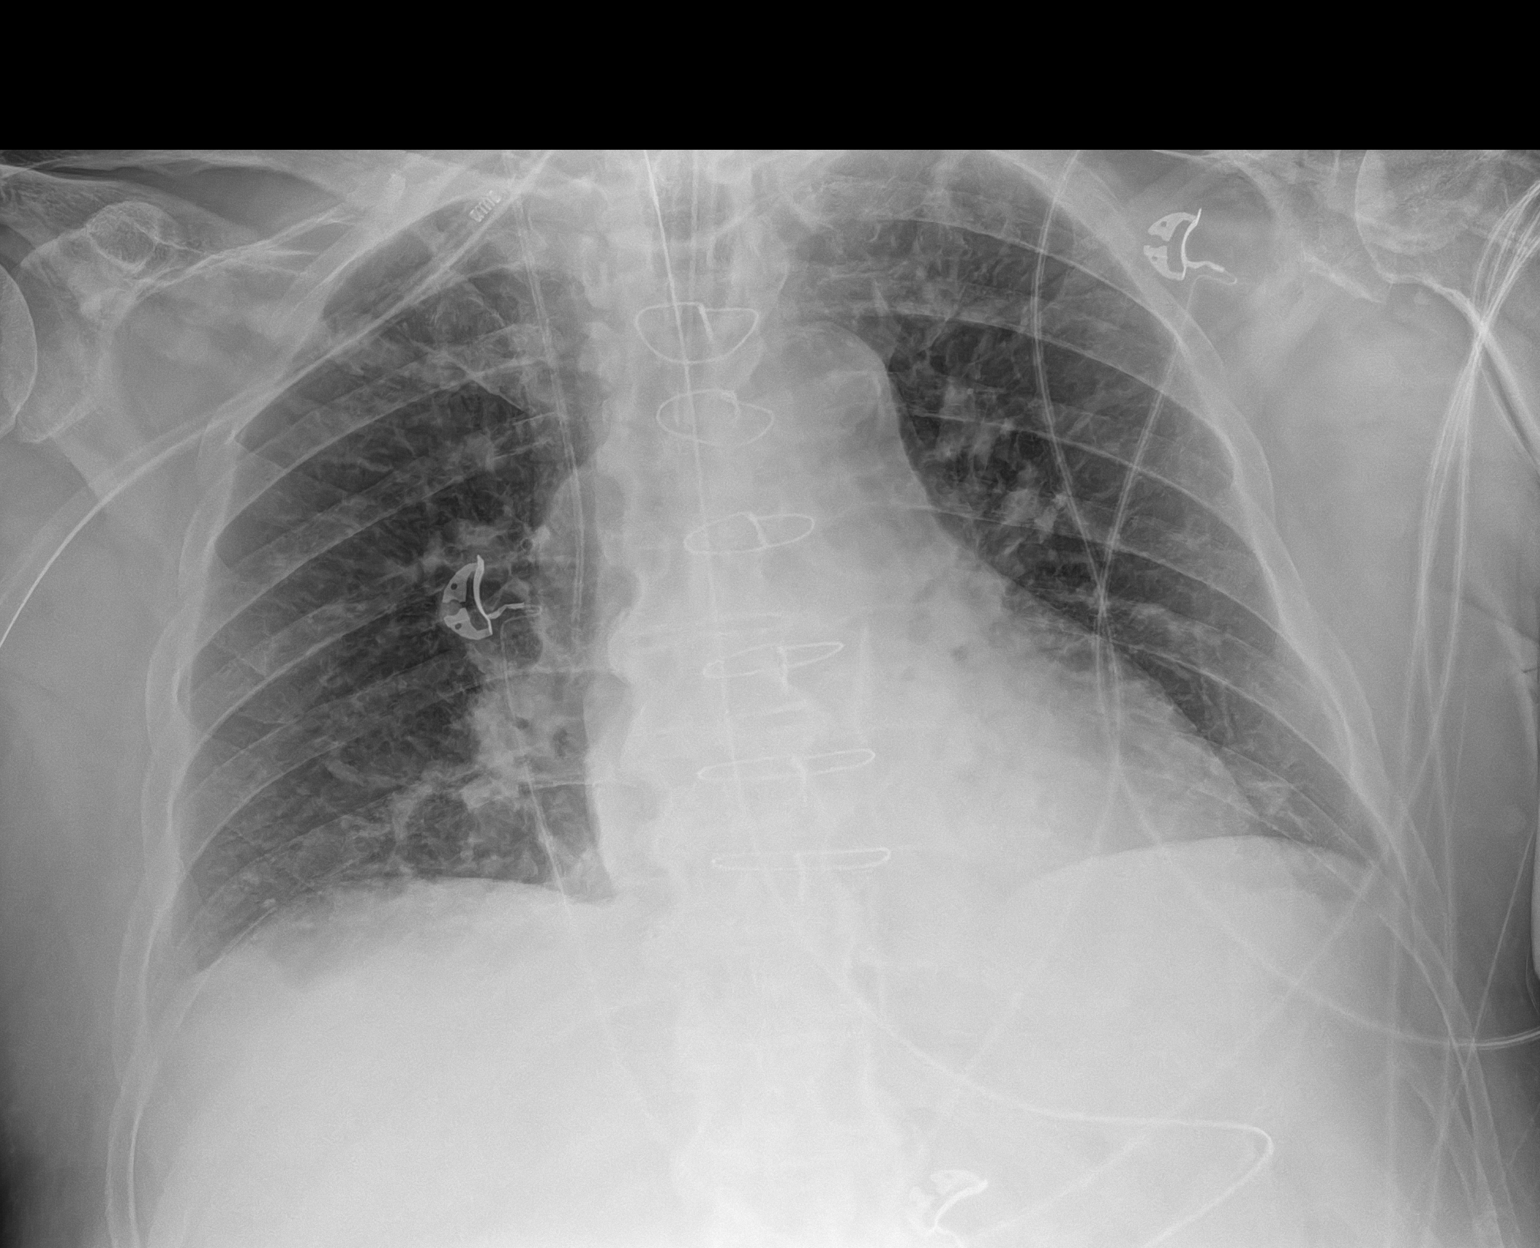

[1 of 1 positions shown; findings below may reference images not displayed]

FINDINGS: Mild medial lung base opacities noted consistent with atelectasis.
Suspect small effusions. Remainder of the lungs is clear.

Endotracheal tube, nasal/orogastric tube and right internal jugular
central venous line are stable and well positioned.

No pneumothorax.
IMPRESSION: 1. No change from the exam obtained earlier today.
2. Mild lung base opacities consistent atelectasis, likely with
small effusions. No convincing pneumonia and no evidence of
pulmonary edema.
3. Support apparatus is stable and well positioned.

## 2023-02-04 IMAGING — DX DG CHEST 1V PORT
1 series · 1 of 1 positions shown · non-contrast
Comparison: None.

CLINICAL DATA: Central line placement.

EXAM:
PORTABLE CHEST 1 VIEW

[chest ap grid]
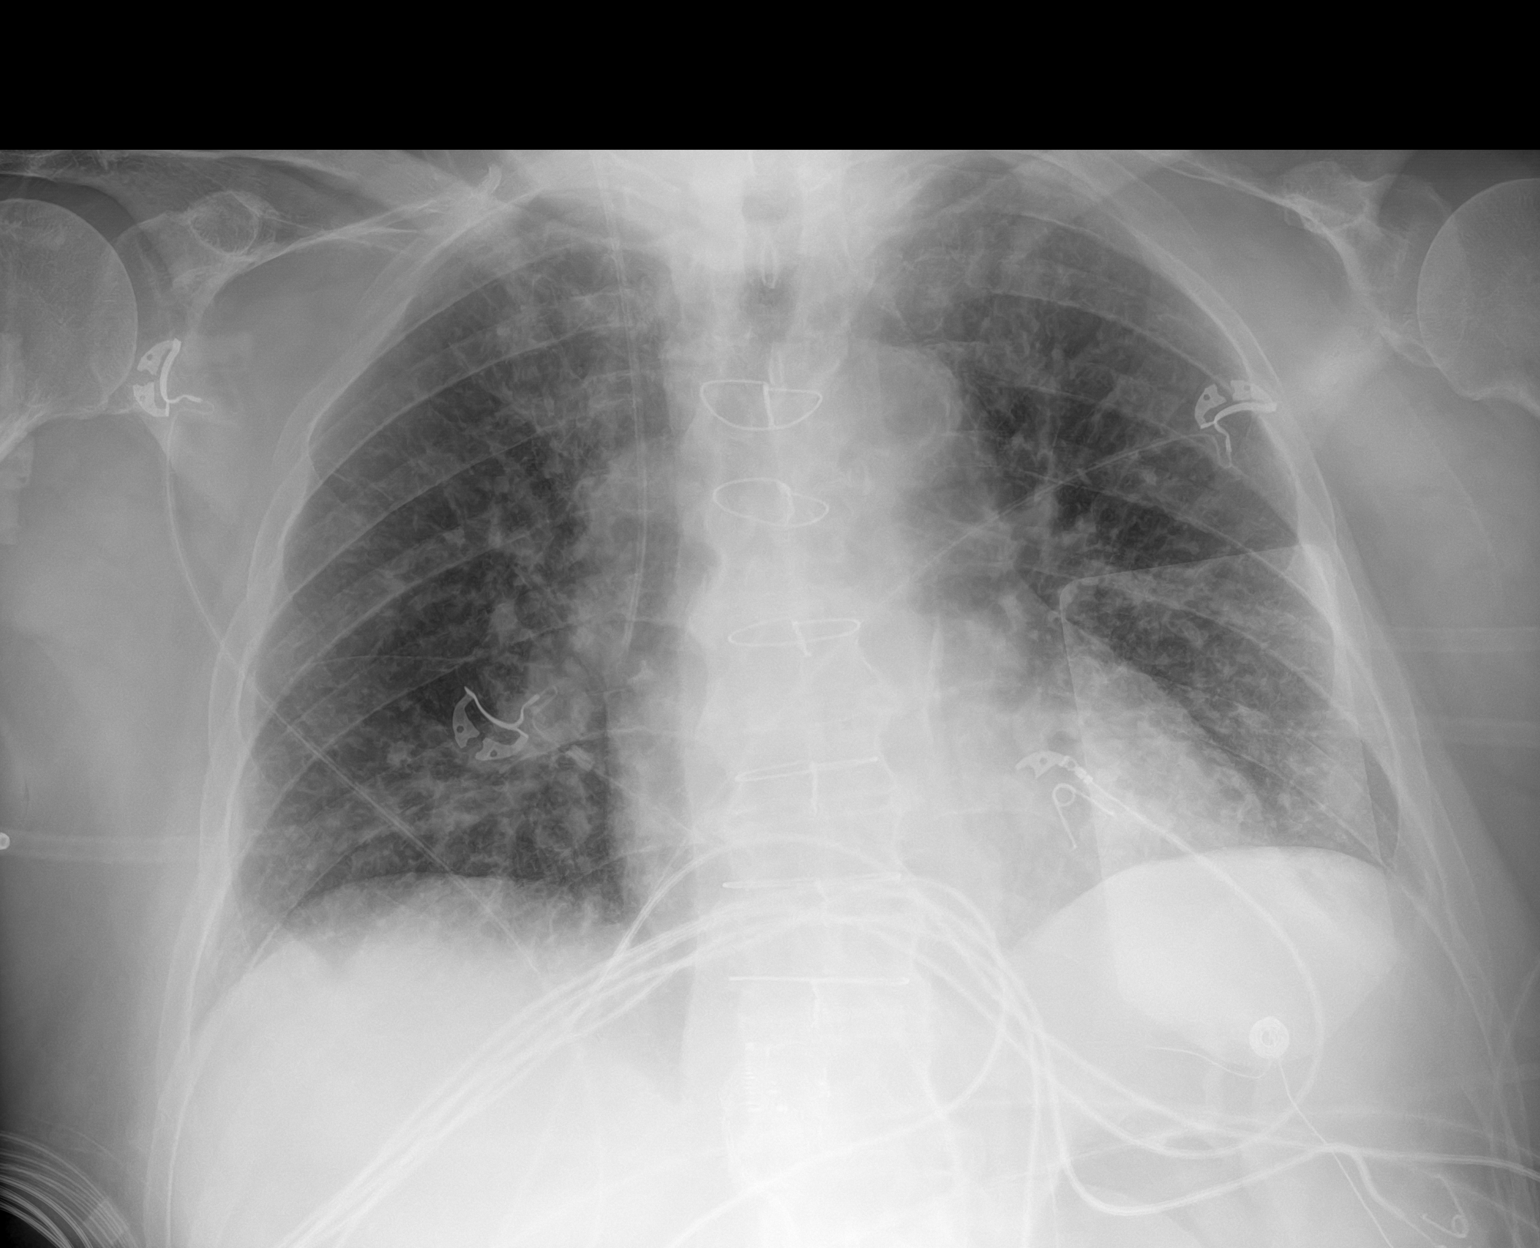

[1 of 1 positions shown; findings below may reference images not displayed]

FINDINGS: A right-sided venous catheter is seen. Its distal tip is noted at
the junction of the superior vena cava and right atrium. Low lung
volumes are present with mild, diffuse, chronic appearing increased
interstitial lung markings. There is no evidence of acute
infiltrate, pleural effusion or pneumothorax. Multiple sternal wires
are seen. The heart size and mediastinal contours are within normal
limits. Multilevel degenerative changes are noted throughout the
thoracic spine.
IMPRESSION: 1. Right-sided venous catheter positioning, as described above.
2. Evidence of prior median sternotomy.

## 2023-02-04 IMAGING — DX DG CHEST 1V PORT SAME DAY
1 series · 1 of 1 positions shown · non-contrast
Comparison: Portable chest earlier today at [DATE] a.m.

CLINICAL DATA: Check intubation, OG tube placement. Respiratory
failure.

EXAM:
PORTABLE CHEST 1 VIEW

[chest ap grid]
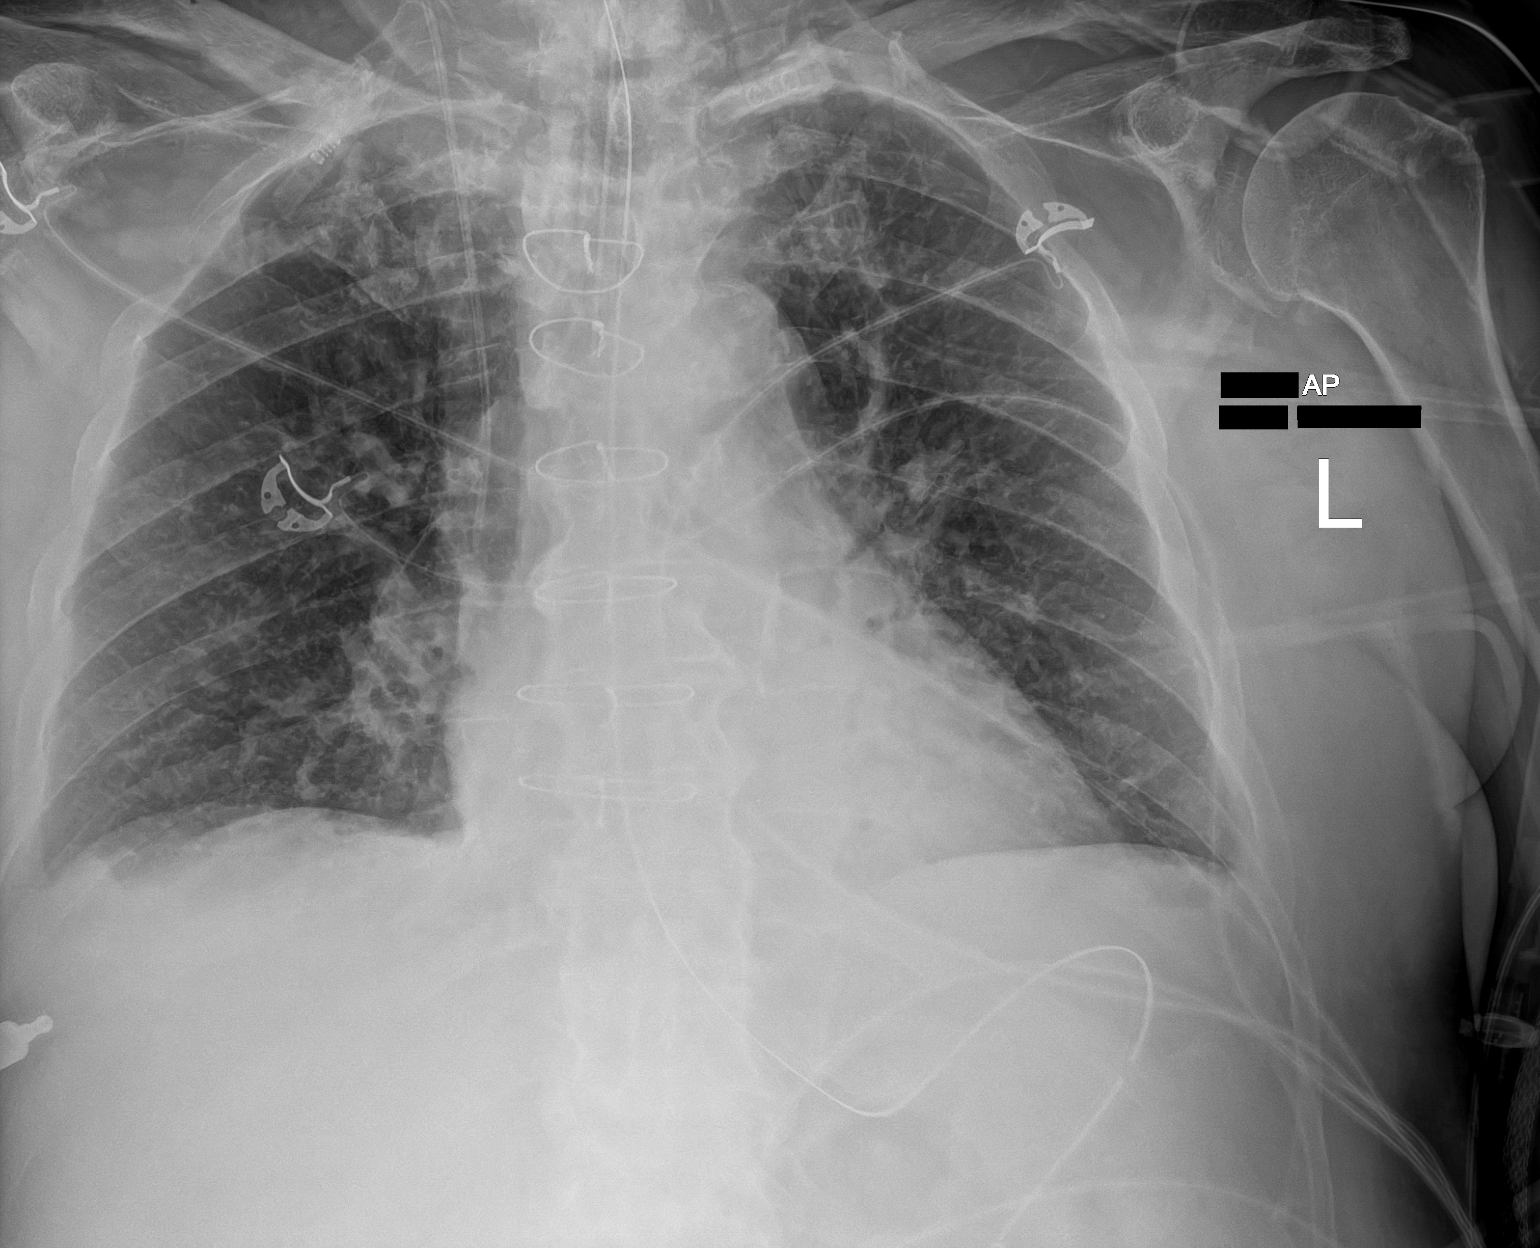

[1 of 1 positions shown; findings below may reference images not displayed]

FINDINGS: ETT is in place with tip 3.6 cm from the carina. Enteric tube has
its tip in the distal body of stomach based on position of the
proximal side hole with the tip not included in the exam.

A right IJ central line is stable in position with tip just above
the cavoatrial junction. There are intact median sternotomy sutures
and mild cardiomegaly.

No vascular congestion is seen. There is increased left infrahilar
lower lobe opacity consistent with atelectasis, pneumonia or
aspiration. No other focal lung infiltrate is seen. There are trace
pleural effusions as seen previously. Thoracic spondylosis.
IMPRESSION: 1. Support tubes are adequately inserted, stable positioning right
IJ line.
2. Trace pleural effusions, with increased opacity in the left lower
lobe consistent with atelectasis, pneumonia or aspiration.

## 2023-10-16 ENCOUNTER — Other Ambulatory Visit: Payer: Self-pay

## 2023-10-16 DIAGNOSIS — N390 Urinary tract infection, site not specified: Secondary | ICD-10-CM | POA: Diagnosis present

## 2023-10-16 DIAGNOSIS — Z79899 Other long term (current) drug therapy: Secondary | ICD-10-CM | POA: Diagnosis not present

## 2023-10-16 DIAGNOSIS — E876 Hypokalemia: Secondary | ICD-10-CM | POA: Insufficient documentation

## 2023-10-16 DIAGNOSIS — F03918 Unspecified dementia, unspecified severity, with other behavioral disturbance: Secondary | ICD-10-CM | POA: Insufficient documentation

## 2023-10-16 DIAGNOSIS — I10 Essential (primary) hypertension: Secondary | ICD-10-CM | POA: Diagnosis not present

## 2023-10-16 DIAGNOSIS — Z7982 Long term (current) use of aspirin: Secondary | ICD-10-CM | POA: Diagnosis not present

## 2023-10-16 LAB — URINALYSIS, ROUTINE W REFLEX MICROSCOPIC
Bilirubin Urine: NEGATIVE
Glucose, UA: NEGATIVE mg/dL
Ketones, ur: NEGATIVE mg/dL
Nitrite: POSITIVE — AB
Protein, ur: 100 mg/dL — AB
RBC / HPF: >50 RBC/hpf (ref 0–5)
Specific Gravity, Urine: 1.023 (ref 1.005–1.030)
WBC, UA: >50 WBC/hpf (ref 0–5)
pH: 6 (ref 5.0–8.0)

## 2023-10-16 LAB — CBC WITH DIFFERENTIAL/PLATELET
Abs Immature Granulocytes: 0.02 10*3/uL (ref 0.00–0.07)
Basophils Absolute: 0 10*3/uL (ref 0.0–0.1)
Basophils Relative: 0 %
Eosinophils Absolute: 0.3 10*3/uL (ref 0.0–0.5)
Eosinophils Relative: 5 %
HCT: 34.4 % — ABNORMAL LOW (ref 39.0–52.0)
Hemoglobin: 11.3 g/dL — ABNORMAL LOW (ref 13.0–17.0)
Immature Granulocytes: 0 %
Lymphocytes Relative: 17 %
Lymphs Abs: 1.1 10*3/uL (ref 0.7–4.0)
MCH: 31.7 pg (ref 26.0–34.0)
MCHC: 32.8 g/dL (ref 30.0–36.0)
MCV: 96.6 fL (ref 80.0–100.0)
Monocytes Absolute: 0.5 10*3/uL (ref 0.1–1.0)
Monocytes Relative: 8 %
Neutro Abs: 4.5 10*3/uL (ref 1.7–7.7)
Neutrophils Relative %: 70 %
Platelets: 139 10*3/uL — ABNORMAL LOW (ref 150–400)
RBC: 3.56 MIL/uL — ABNORMAL LOW (ref 4.22–5.81)
RDW: 13.8 % (ref 11.5–15.5)
WBC: 6.4 10*3/uL (ref 4.0–10.5)
nRBC: 0 % (ref 0.0–0.2)

## 2023-10-16 LAB — URINE DRUG SCREEN, QUALITATIVE (ARMC ONLY)
Amphetamines, Ur Screen: NOT DETECTED
Barbiturates, Ur Screen: NOT DETECTED
Benzodiazepine, Ur Scrn: NOT DETECTED
Cannabinoid 50 Ng, Ur ~~LOC~~: NOT DETECTED
Cocaine Metabolite,Ur ~~LOC~~: NOT DETECTED
MDMA (Ecstasy)Ur Screen: NOT DETECTED
Methadone Scn, Ur: NOT DETECTED
Opiate, Ur Screen: NOT DETECTED
Phencyclidine (PCP) Ur S: NOT DETECTED
Tricyclic, Ur Screen: NOT DETECTED

## 2023-10-16 LAB — COMPREHENSIVE METABOLIC PANEL
ALT: 18 U/L (ref 0–44)
AST: 29 U/L (ref 15–41)
Albumin: 3.8 g/dL (ref 3.5–5.0)
Alkaline Phosphatase: 44 U/L (ref 38–126)
Anion gap: 8 (ref 5–15)
BUN: 19 mg/dL (ref 8–23)
CO2: 26 mmol/L (ref 22–32)
Calcium: 9.1 mg/dL (ref 8.9–10.3)
Chloride: 105 mmol/L (ref 98–111)
Creatinine, Ser: 0.83 mg/dL (ref 0.61–1.24)
GFR, Estimated: >60 mL/min (ref >60)
Glucose, Bld: 104 mg/dL — ABNORMAL HIGH (ref 70–99)
Potassium: 3.3 mmol/L — ABNORMAL LOW (ref 3.5–5.1)
Sodium: 139 mmol/L (ref 135–145)
Total Bilirubin: 1.1 mg/dL (ref 0.0–1.2)
Total Protein: 7.6 g/dL (ref 6.5–8.1)

## 2023-10-16 LAB — ETHANOL: Alcohol, Ethyl (B): <10 mg/dL (ref <10)

## 2023-10-16 NOTE — ED Notes (Signed)
 Pt was changed into hospital required purple paper scrubs from his personal clothing, witnessed by this RN, and NT [employer], and [employer].   Black strap up shoes [employer] ($154 dollars cash) Black pants [place] long sleeve shirt  Watch with silver and gold band.
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
## 2023-10-17 ENCOUNTER — Emergency Department
Admission: EM | Admit: 2023-10-17 | Discharge: 2023-10-18 | Disposition: A | Discharge status: Home / Self Care | Attending: Emergency Medicine | Admitting: Emergency Medicine

## 2023-10-17 DIAGNOSIS — F03918 Unspecified dementia, unspecified severity, with other behavioral disturbance: Secondary | ICD-10-CM

## 2023-10-17 DIAGNOSIS — N390 Urinary tract infection, site not specified: Secondary | ICD-10-CM

## 2023-10-17 DIAGNOSIS — E876 Hypokalemia: Secondary | ICD-10-CM

## 2023-10-17 HISTORY — DX: Unspecified dementia, unspecified severity, without behavioral disturbance, psychotic disturbance, mood disturbance, and anxiety: F03.90

## 2023-10-17 HISTORY — DX: Post-traumatic stress disorder, unspecified: F43.10

## 2023-10-17 LAB — TROPONIN I (HIGH SENSITIVITY): Troponin I (High Sensitivity): 9 ng/L (ref <18)

## 2023-10-17 LAB — CBG MONITORING, ED: Glucose-Capillary: 98 mg/dL (ref 70–99)

## 2023-10-17 MED ORDER — CEPHALEXIN 250 MG PO CAPS
250.0000 mg | ORAL_CAPSULE | Freq: Three times a day (TID) | ORAL | Status: DC
  Administered 2023-10-17 – 2023-10-18 (×5): 250 mg via ORAL
  Filled 2023-10-17 (×5): qty 1

## 2023-10-17 MED ORDER — POTASSIUM CHLORIDE CRYS ER 20 MEQ PO TBCR
40.0000 meq | EXTENDED_RELEASE_TABLET | Freq: Once | ORAL | Status: AC
  Administered 2023-10-17: 40 meq via ORAL
  Filled 2023-10-17: qty 2
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
## 2023-10-17 NOTE — ED Notes (Signed)
IVC prior to arrival  

## 2023-10-17 NOTE — ED Notes (Signed)
 Pt in interview room for TTS. Security with pt.

## 2023-10-17 NOTE — ED Notes (Signed)
 Patient given dinner tray but refusing to eat at this time. Will attempt again in a little bit.
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
## 2023-10-17 NOTE — ED Notes (Signed)
 Pt declining to eat lunch

## 2023-10-17 NOTE — ED Notes (Signed)
 Name band for [facility] accidentally scanned this patient instead of the correct one. POC edit sheet sent in for correction.

## 2023-10-17 NOTE — ED Notes (Signed)
PT  IVC  CONSULT  DONE  PENDING  PLACEMENT 
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
## 2023-10-17 NOTE — ED Notes (Signed)
 Pt eating lunch at this time

## 2023-10-17 NOTE — ED Notes (Signed)
 Pt expressing frustration with not being able to ambulate on own. RN educated pt on importance of not getting up on his own d/t fall risk. Pt asked this RN "are you mentally challenged, you're stupid"
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
## 2023-10-18 MED ORDER — CEPHALEXIN 500 MG PO CAPS: 500.0000 mg | ORAL_CAPSULE | Freq: Four times a day (QID) | ORAL | 0 refills | Status: AC

## 2023-10-18 NOTE — ED Notes (Addendum)
 After all necessary paperwork has been filled out, this pt was escorted via wheelchair to the ED circle with this RN and family. Pt has all belongings with family. Pt assisted into the car to ensure foley bag was not tugged or pulled out. Pt seatbelt secured and pt left with family. Pt ABCs intact. RR even and unlabored. Pt in NAD.

## 2023-10-18 NOTE — ED Notes (Signed)
 Pt provided with dinner tray.

## 2023-10-18 NOTE — ED Notes (Signed)
 IVC  Disposition  pending

## 2023-10-18 NOTE — ED Notes (Addendum)
 Pt foley bag drained of urine prior to discharge with family. Pts foley catheter not removed by this RN since pt came into the ED with it. RN confirmed with pts legal guardian that the foley was in place prior to coming here.

## 2023-10-18 NOTE — ED Notes (Signed)
 Pts daughter called, upset about receiving pharmacy notification about pts antibiotic. Informed daughter that pt was going to be discharged and that a call was going to be made soon about pending discharge. Pts daughter yelling at this RN, "theres no way they can discharge him in the condition that he is in", explained that pt has been psych cleared and that he was being medically cleared with need for PO antibiotic for UTI but that pt has been A/O, calm and cooperative today. Daughter demands to receive call from psych provider. Pysch provider was notifed and EDP made aware as well.

## 2023-10-18 NOTE — ED Notes (Signed)
 [facility] laid eyes on patient this AM.  Patient was calm and sitting up in bed.  Patient was cooperative and willing to answer questions.  Patient stated he was feeling okay today.  Patient confirmed needed assistance to walk to provide support and stability. Patient stated he can ambulate with a walker.  Patient asked [facility] to use phone.  [facility] updated Pattricia Boss, Charity fundraiser.    Graylon Good, [facility]
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
## 2023-10-18 NOTE — ED Notes (Signed)
 MD Tan filled out pts paperwork to rescind IVC. Family aware and awaiting pts belongings prior to discharge with patient.

## 2023-10-18 NOTE — ED Notes (Addendum)
 Pt still present with baseline dementia and confirmed UTI. Pt being treated with antibiotics. Per outgoing RN, pt cleared by Psych and does not meet criteria for admission. Pts family has been contacted to pick pt up upon discharge, but family does not seem to want to pick him up. Pt ABCs intact. RR even and unlabored. Pt in NAD. Bed in lowest locked position. Pt denies needs at this time. Pt foley catheter, that the pt came in with is still intact.   Past Medical History:  Diagnosis Date   Arthritis    Dementia (HCC)    Hypertension    PTSD (post-traumatic stress disorder)    UTI (urinary tract infection)

## 2023-10-18 NOTE — ED Provider Notes (Signed)
 Emergency Medicine Observation Re-evaluation Note  Patrick Kerr is a 78 y.o. male, seen on rounds today.  Pt initially presented to the ED for complaints of IVC Currently, the patient is resting, voices no medical complaints.  Physical Exam  BP 135/61 (BP Location: Right Arm)   Pulse 74   Temp 97.9 F (36.6 C) (Oral)   Resp 16   Ht 6\' 4"  (1.93 m)   Wt 104.3 kg   SpO2 95%   BMI 28.00 kg/m  Physical Exam General: Resting in no acute distress Cardiac: No cyanosis Lungs: Equal rise and fall Psych: Not agitated  ED Course / MDM  EKG:EKG Interpretation Date/Time:  Tuesday October 17 2023 02:32:24 EDT Ventricular Rate:  69 PR Interval:  200 QRS Duration:  164 QT Interval:  436 QTC Calculation: 467 R Axis:   -47  Text Interpretation: Normal sinus rhythm Right bundle branch block Left anterior fascicular block Bifascicular block Septal infarct , age undetermined Abnormal ECG When compared with ECG of 31-Aug-2021 08:10, Septal infarct is now Present T wave inversion more evident in Anterior leads Confirmed by UNCONFIRMED, DOCTOR ([id number]), editor Lonell Face [phone]) on 10/17/2023 6:55:17 AM  I have reviewed the labs performed to date as well as medications administered while in observation.  Recent changes in the last 24 hours include no events overnight.  Unfortunately, it does not appear that pharmacy has reconciled patient's medications so his home medications have not yet been ordered.  I have placed another consult in the computer for this to be done.  Plan  Current plan is for psychiatric disposition.    Irean Hong, MD 10/18/23 [phone]

## 2023-10-18 NOTE — Discharge Instructions (Addendum)
 Here are psychiatry's recommendations for medication adjustments.  Discussed this with patient's primary care doctor to make the adjustments. - They were recommending to increase Abilify to 10 mg Qdaily - Continue with zoloft 200 mg Qdaily  -They are also recommending to tapering Ativan slowly since this might be causing the agitation.  Please take the antibiotic as prescribed for urinary tract infection.

## 2023-10-18 NOTE — ED Notes (Addendum)
 Pt family here, looking to speak with social work about finding placement for pt. Family does not wish to continue caring for pt upon discharge.

## 2023-10-18 NOTE — ED Notes (Addendum)
 RN went to Patrick Kerr patient belongings closet and retrieved all of pts belongings. Pts 2 bags returned to pt family.
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
## 2023-10-18 NOTE — ED Notes (Signed)
 IVC per NP note patient does not meet criteria but no disposition recommened

## 2023-10-18 NOTE — ED Provider Notes (Signed)
.-----------------------------------------   5:08 PM on 10/18/2023 -----------------------------------------  Blood pressure (!) 178/80, pulse 73, temperature 97.9 F (36.6 C), temperature source Oral, resp. rate 20, height 6\' 4"  (1.93 m), weight 104.3 kg, SpO2 98%.  Notified by nursing that psychiatry has cleared patient for discharge.   They thought that his agitation is secondary to UTI and has now cleared.  On assessment, he is calm, alert and oriented, is ready to go home.  Lives with his wife. I will remove the IVC.  Will give him a prescription for the rest of his antibiotics.  Psychiatry had recommended increasing his Abilify to 10 mg.  Will clarify with him if they wanted [place] to do it here before discharge and send him a prescription for it or have his primary care doctor increase his Abilify and monitor him.  Spoke to psych NP days White who recommended that this patient is clearing up in his symptoms could have been secondary to the UTI, that he follow-up with [facility] doctor to discuss any medication adjustments to his psych medications.  She will reach out to daughter to answer questions prior to discharge.  Psych spoke to the patient's family and explained situation, after discussion with psych, family states understanding and will take him home.  Keflex antibiotics were sent to his pharmacy.  Patient is safe for outpatient management.  Will discharge her strict precautions.  Also added psych's recommendation into the discharge reports that they have follow-up with primary care doctor to get his meds reassess to see if they need to be titrated.  Discharged.   Claybon Jabs, MD 10/18/23 [phone]

## 2023-10-18 NOTE — ED Notes (Signed)
 IVC pending placement

## 2023-10-18 NOTE — ED Notes (Addendum)
 After speaking with day shift Psych NP in the family consult room, the patient's family has decided to proceed with taking pt home and looking into outside resources for pt.

## 2023-10-19 LAB — URINE CULTURE: Culture: >=100000 — AB
